# Patient Record
Sex: Female | Born: 1935 | Race: Black or African American | Hispanic: No | Smoking: Never smoker
Health system: Southern US, Community
[De-identification: ages and names within clinical notes are randomized; demographics above are authoritative.]

## PROBLEM LIST (undated history)

## (undated) DIAGNOSIS — M179 Osteoarthritis of knee, unspecified: Secondary | ICD-10-CM

## (undated) DIAGNOSIS — E785 Hyperlipidemia, unspecified: Secondary | ICD-10-CM

## (undated) DIAGNOSIS — M549 Dorsalgia, unspecified: Secondary | ICD-10-CM

## (undated) DIAGNOSIS — M25761 Osteophyte, right knee: Secondary | ICD-10-CM

## (undated) DIAGNOSIS — I10 Essential (primary) hypertension: Secondary | ICD-10-CM

## (undated) DIAGNOSIS — M1710 Unilateral primary osteoarthritis, unspecified knee: Secondary | ICD-10-CM

## (undated) HISTORY — DX: Essential (primary) hypertension: I10

## (undated) HISTORY — DX: Hyperlipidemia, unspecified: E78.5

## (undated) HISTORY — PX: OTHER SURGICAL HISTORY: SHX169

## (undated) HISTORY — DX: Osteophyte, right knee: M25.761

## (undated) HISTORY — DX: Osteoarthritis of knee, unspecified: M17.9

## (undated) HISTORY — DX: Dorsalgia, unspecified: M54.9

## (undated) HISTORY — DX: Unilateral primary osteoarthritis, unspecified knee: M17.10

---

## 2000-10-17 ENCOUNTER — Emergency Department (HOSPITAL_COMMUNITY): Admission: EM | Admit: 2000-10-17 | Discharge: 2000-10-17 | Payer: Self-pay | Admitting: *Deleted

## 2002-03-17 HISTORY — PX: OTHER SURGICAL HISTORY: SHX169

## 2002-11-01 ENCOUNTER — Encounter: Payer: Self-pay | Admitting: Family Medicine

## 2002-11-01 ENCOUNTER — Ambulatory Visit (HOSPITAL_COMMUNITY): Admission: RE | Admit: 2002-11-01 | Discharge: 2002-11-01 | Payer: Self-pay | Admitting: Family Medicine

## 2003-12-26 ENCOUNTER — Encounter (HOSPITAL_COMMUNITY): Admission: RE | Admit: 2003-12-26 | Discharge: 2003-12-26 | Payer: Self-pay | Admitting: *Deleted

## 2004-03-26 ENCOUNTER — Ambulatory Visit: Payer: Self-pay | Admitting: Family Medicine

## 2004-04-08 ENCOUNTER — Ambulatory Visit (HOSPITAL_COMMUNITY): Admission: RE | Admit: 2004-04-08 | Discharge: 2004-04-08 | Payer: Self-pay | Admitting: Family Medicine

## 2004-09-02 ENCOUNTER — Ambulatory Visit: Payer: Self-pay | Admitting: Family Medicine

## 2005-01-23 ENCOUNTER — Ambulatory Visit: Payer: Self-pay | Admitting: Family Medicine

## 2005-03-27 ENCOUNTER — Ambulatory Visit: Payer: Self-pay | Admitting: Family Medicine

## 2005-11-07 ENCOUNTER — Ambulatory Visit: Payer: Self-pay | Admitting: Family Medicine

## 2006-01-29 ENCOUNTER — Ambulatory Visit: Payer: Self-pay | Admitting: Family Medicine

## 2006-05-26 ENCOUNTER — Encounter: Payer: Self-pay | Admitting: Family Medicine

## 2006-05-26 ENCOUNTER — Other Ambulatory Visit: Admission: RE | Admit: 2006-05-26 | Discharge: 2006-05-26 | Payer: Self-pay | Admitting: Family Medicine

## 2006-05-26 ENCOUNTER — Ambulatory Visit: Payer: Self-pay | Admitting: Family Medicine

## 2006-05-26 LAB — CONVERTED CEMR LAB
ALT: 14 units/L (ref 0–35)
AST: 13 units/L (ref 0–37)
Albumin: 3.9 g/dL (ref 3.5–5.2)
Alkaline Phosphatase: 47 units/L (ref 39–117)
BUN: 21 mg/dL (ref 6–23)
Bilirubin, Direct: 0.1 mg/dL (ref 0.0–0.3)
CO2: 27 meq/L (ref 19–32)
Calcium: 9.5 mg/dL (ref 8.4–10.5)
Chloride: 103 meq/L (ref 96–112)
Cholesterol: 173 mg/dL (ref 0–200)
Creatinine, Ser: 0.6 mg/dL (ref 0.40–1.20)
Glucose, Bld: 83 mg/dL (ref 70–99)
HDL: 60 mg/dL (ref >39)
Indirect Bilirubin: 0.8 mg/dL (ref 0.0–0.9)
LDL Cholesterol: 99 mg/dL (ref 0–99)
Pap Smear: NORMAL
Potassium: 3.7 meq/L (ref 3.5–5.3)
Sodium: 141 meq/L (ref 135–145)
Total Bilirubin: 0.9 mg/dL (ref 0.3–1.2)
Total CHOL/HDL Ratio: 2.9
Total Protein: 7.2 g/dL (ref 6.0–8.3)
Triglycerides: 69 mg/dL (ref <150)
VLDL: 14 mg/dL (ref 0–40)

## 2006-11-03 ENCOUNTER — Ambulatory Visit: Payer: Self-pay | Admitting: Family Medicine

## 2006-11-08 ENCOUNTER — Inpatient Hospital Stay (HOSPITAL_COMMUNITY): Admission: EM | Admit: 2006-11-08 | Discharge: 2006-11-09 | Payer: Self-pay | Admitting: Emergency Medicine

## 2006-11-08 ENCOUNTER — Ambulatory Visit: Payer: Self-pay | Admitting: Internal Medicine

## 2006-11-11 ENCOUNTER — Ambulatory Visit: Payer: Self-pay | Admitting: Family Medicine

## 2007-03-18 ENCOUNTER — Encounter: Payer: Self-pay | Admitting: Family Medicine

## 2007-05-12 ENCOUNTER — Ambulatory Visit: Payer: Self-pay | Admitting: Family Medicine

## 2007-06-04 ENCOUNTER — Encounter: Payer: Self-pay | Admitting: Family Medicine

## 2007-06-04 LAB — CONVERTED CEMR LAB
BUN: 20 mg/dL (ref 6–23)
Basophils Absolute: 0 10*3/uL (ref 0.0–0.1)
Basophils Relative: 0 % (ref 0–1)
CO2: 27 meq/L (ref 19–32)
Calcium: 9.8 mg/dL (ref 8.4–10.5)
Chloride: 101 meq/L (ref 96–112)
Cholesterol: 179 mg/dL (ref 0–200)
Creatinine, Ser: 0.55 mg/dL (ref 0.40–1.20)
Eosinophils Absolute: 0.1 10*3/uL (ref 0.0–0.7)
Eosinophils Relative: 2 % (ref 0–5)
Glucose, Bld: 84 mg/dL (ref 70–99)
HCT: 40.1 % (ref 36.0–46.0)
HDL: 66 mg/dL (ref >39)
Hemoglobin: 14.5 g/dL (ref 12.0–15.0)
LDL Cholesterol: 101 mg/dL — ABNORMAL HIGH (ref 0–99)
Lymphocytes Relative: 31 % (ref 12–46)
Lymphs Abs: 1.7 10*3/uL (ref 0.7–4.0)
MCHC: 36.2 g/dL — ABNORMAL HIGH (ref 30.0–36.0)
MCV: 80.8 fL (ref 78.0–100.0)
Monocytes Absolute: 0.4 10*3/uL (ref 0.1–1.0)
Monocytes Relative: 8 % (ref 3–12)
Neutro Abs: 3.1 10*3/uL (ref 1.7–7.7)
Neutrophils Relative %: 59 % (ref 43–77)
Platelets: 246 10*3/uL (ref 150–400)
Potassium: 3.8 meq/L (ref 3.5–5.3)
RBC: 4.96 M/uL (ref 3.87–5.11)
RDW: 14.8 % (ref 11.5–15.5)
Sodium: 145 meq/L (ref 135–145)
Total CHOL/HDL Ratio: 2.7
Triglycerides: 58 mg/dL (ref <150)
VLDL: 12 mg/dL (ref 0–40)
WBC: 5.3 10*3/uL (ref 4.0–10.5)

## 2007-06-08 ENCOUNTER — Ambulatory Visit: Payer: Self-pay | Admitting: Family Medicine

## 2007-06-15 ENCOUNTER — Ambulatory Visit (HOSPITAL_COMMUNITY): Admission: RE | Admit: 2007-06-15 | Discharge: 2007-06-15 | Payer: Self-pay | Admitting: Family Medicine

## 2007-06-17 ENCOUNTER — Encounter: Payer: Self-pay | Admitting: Family Medicine

## 2007-06-17 DIAGNOSIS — M549 Dorsalgia, unspecified: Secondary | ICD-10-CM | POA: Insufficient documentation

## 2007-06-17 DIAGNOSIS — I10 Essential (primary) hypertension: Secondary | ICD-10-CM | POA: Insufficient documentation

## 2007-06-17 DIAGNOSIS — E785 Hyperlipidemia, unspecified: Secondary | ICD-10-CM | POA: Insufficient documentation

## 2007-06-17 DIAGNOSIS — IMO0002 Reserved for concepts with insufficient information to code with codable children: Secondary | ICD-10-CM | POA: Insufficient documentation

## 2007-06-17 DIAGNOSIS — M1710 Unilateral primary osteoarthritis, unspecified knee: Secondary | ICD-10-CM | POA: Insufficient documentation

## 2007-09-28 ENCOUNTER — Ambulatory Visit: Payer: Self-pay | Admitting: Family Medicine

## 2007-09-28 ENCOUNTER — Ambulatory Visit (HOSPITAL_COMMUNITY): Admission: RE | Admit: 2007-09-28 | Discharge: 2007-09-28 | Payer: Self-pay | Admitting: Family Medicine

## 2007-09-28 LAB — CONVERTED CEMR LAB
ALT: 16 units/L (ref 0–35)
AST: 13 units/L (ref 0–37)
Albumin: 3.9 g/dL (ref 3.5–5.2)
Alkaline Phosphatase: 43 units/L (ref 39–117)
BUN: 20 mg/dL (ref 6–23)
Basophils Absolute: 0 10*3/uL (ref 0.0–0.1)
Basophils Relative: 0 % (ref 0–1)
Bilirubin, Direct: <0.1 mg/dL (ref 0.0–0.3)
CO2: 27 meq/L (ref 19–32)
Calcium: 9.9 mg/dL (ref 8.4–10.5)
Chloride: 105 meq/L (ref 96–112)
Cholesterol: 190 mg/dL (ref 0–200)
Creatinine, Ser: 0.64 mg/dL (ref 0.40–1.20)
Eosinophils Absolute: 0.2 10*3/uL (ref 0.0–0.7)
Eosinophils Relative: 2 % (ref 0–5)
Glucose, Bld: 84 mg/dL (ref 70–99)
HCT: 38.9 % (ref 36.0–46.0)
HDL: 58 mg/dL (ref >39)
Hemoglobin: 14.2 g/dL (ref 12.0–15.0)
LDL Cholesterol: 118 mg/dL — ABNORMAL HIGH (ref 0–99)
Lymphocytes Relative: 25 % (ref 12–46)
Lymphs Abs: 2.2 10*3/uL (ref 0.7–4.0)
MCHC: 36.5 g/dL — ABNORMAL HIGH (ref 30.0–36.0)
MCV: 80.4 fL (ref 78.0–100.0)
Monocytes Absolute: 0.7 10*3/uL (ref 0.1–1.0)
Monocytes Relative: 8 % (ref 3–12)
Neutro Abs: 5.8 10*3/uL (ref 1.7–7.7)
Neutrophils Relative %: 65 % (ref 43–77)
Platelets: 267 10*3/uL (ref 150–400)
Potassium: 3.7 meq/L (ref 3.5–5.3)
RBC: 4.84 M/uL (ref 3.87–5.11)
RDW: 14.7 % (ref 11.5–15.5)
Sodium: 142 meq/L (ref 135–145)
TSH: 3.108 microintl units/mL (ref 0.350–4.50)
Total Bilirubin: 0.6 mg/dL (ref 0.3–1.2)
Total CHOL/HDL Ratio: 3.3
Total Protein: 7.6 g/dL (ref 6.0–8.3)
Triglycerides: 69 mg/dL (ref <150)
VLDL: 14 mg/dL (ref 0–40)
WBC: 8.9 10*3/uL (ref 4.0–10.5)

## 2007-10-28 ENCOUNTER — Ambulatory Visit: Payer: Self-pay | Admitting: Cardiology

## 2007-10-28 ENCOUNTER — Encounter: Payer: Self-pay | Admitting: Family Medicine

## 2008-02-14 ENCOUNTER — Ambulatory Visit: Payer: Self-pay | Admitting: Family Medicine

## 2008-05-08 ENCOUNTER — Encounter: Payer: Self-pay | Admitting: Family Medicine

## 2008-06-08 ENCOUNTER — Encounter: Payer: Self-pay | Admitting: Family Medicine

## 2008-06-19 ENCOUNTER — Ambulatory Visit (HOSPITAL_COMMUNITY): Admission: RE | Admit: 2008-06-19 | Discharge: 2008-06-19 | Payer: Self-pay | Admitting: Family Medicine

## 2008-08-16 ENCOUNTER — Ambulatory Visit: Payer: Self-pay | Admitting: Family Medicine

## 2008-08-16 ENCOUNTER — Other Ambulatory Visit: Admission: RE | Admit: 2008-08-16 | Discharge: 2008-08-16 | Payer: Self-pay | Admitting: Family Medicine

## 2008-08-16 ENCOUNTER — Encounter: Payer: Self-pay | Admitting: Family Medicine

## 2008-08-16 DIAGNOSIS — R5383 Other fatigue: Secondary | ICD-10-CM

## 2008-08-16 DIAGNOSIS — R5381 Other malaise: Secondary | ICD-10-CM | POA: Insufficient documentation

## 2008-08-16 LAB — CONVERTED CEMR LAB
ALT: 13 units/L (ref 0–35)
AST: 14 units/L (ref 0–37)
Albumin: 4 g/dL (ref 3.5–5.2)
Alkaline Phosphatase: 42 units/L (ref 39–117)
BUN: 18 mg/dL (ref 6–23)
Basophils Absolute: 0 10*3/uL (ref 0.0–0.1)
Basophils Relative: 0 % (ref 0–1)
Bilirubin, Direct: 0.1 mg/dL (ref 0.0–0.3)
CO2: 27 meq/L (ref 19–32)
Calcium: 9.9 mg/dL (ref 8.4–10.5)
Chloride: 101 meq/L (ref 96–112)
Cholesterol: 143 mg/dL (ref 0–200)
Creatinine, Ser: 0.67 mg/dL (ref 0.40–1.20)
Eosinophils Absolute: 0.1 10*3/uL (ref 0.0–0.7)
Eosinophils Relative: 1 % (ref 0–5)
Glucose, Bld: 84 mg/dL (ref 70–99)
HCT: 38 % (ref 36.0–46.0)
HDL: 63 mg/dL (ref >39)
Hemoglobin: 13.9 g/dL (ref 12.0–15.0)
Indirect Bilirubin: 0.6 mg/dL (ref 0.0–0.9)
LDL Cholesterol: 68 mg/dL (ref 0–99)
Lymphocytes Relative: 37 % (ref 12–46)
Lymphs Abs: 2.3 10*3/uL (ref 0.7–4.0)
MCHC: 36.6 g/dL — ABNORMAL HIGH (ref 30.0–36.0)
MCV: 80.9 fL (ref 78.0–100.0)
Monocytes Absolute: 0.5 10*3/uL (ref 0.1–1.0)
Monocytes Relative: 8 % (ref 3–12)
Neutro Abs: 3.3 10*3/uL (ref 1.7–7.7)
Neutrophils Relative %: 54 % (ref 43–77)
OCCULT 1: NEGATIVE
Platelets: 227 10*3/uL (ref 150–400)
Potassium: 4.1 meq/L (ref 3.5–5.3)
RBC: 4.7 M/uL (ref 3.87–5.11)
RDW: 14.8 % (ref 11.5–15.5)
Sodium: 143 meq/L (ref 135–145)
Total Bilirubin: 0.7 mg/dL (ref 0.3–1.2)
Total CHOL/HDL Ratio: 2.3
Total Protein: 7.2 g/dL (ref 6.0–8.3)
Triglycerides: 61 mg/dL (ref <150)
VLDL: 12 mg/dL (ref 0–40)
WBC: 6.2 10*3/uL (ref 4.0–10.5)

## 2008-09-19 ENCOUNTER — Telehealth: Payer: Self-pay | Admitting: Family Medicine

## 2008-11-03 ENCOUNTER — Encounter: Payer: Self-pay | Admitting: Family Medicine

## 2008-11-13 ENCOUNTER — Encounter: Payer: Self-pay | Admitting: Family Medicine

## 2008-12-01 ENCOUNTER — Encounter: Payer: Self-pay | Admitting: Family Medicine

## 2008-12-06 ENCOUNTER — Encounter: Payer: Self-pay | Admitting: Family Medicine

## 2009-01-03 ENCOUNTER — Ambulatory Visit: Payer: Self-pay | Admitting: Family Medicine

## 2009-01-06 DIAGNOSIS — E663 Overweight: Secondary | ICD-10-CM | POA: Insufficient documentation

## 2009-01-23 ENCOUNTER — Telehealth: Payer: Self-pay | Admitting: Family Medicine

## 2009-03-17 HISTORY — PX: CHOLECYSTECTOMY: SHX55

## 2009-06-19 ENCOUNTER — Ambulatory Visit: Payer: Self-pay | Admitting: Family Medicine

## 2009-06-20 LAB — CONVERTED CEMR LAB
ALT: 15 units/L (ref 0–35)
AST: 13 units/L (ref 0–37)
Albumin: 4.5 g/dL (ref 3.5–5.2)
Alkaline Phosphatase: 43 units/L (ref 39–117)
BUN: 16 mg/dL (ref 6–23)
Basophils Absolute: 0 10*3/uL (ref 0.0–0.1)
Basophils Relative: 0 % (ref 0–1)
Bilirubin, Direct: 0.1 mg/dL (ref 0.0–0.3)
CO2: 29 meq/L (ref 19–32)
Calcium: 9.8 mg/dL (ref 8.4–10.5)
Chloride: 103 meq/L (ref 96–112)
Cholesterol: 166 mg/dL (ref 0–200)
Creatinine, Ser: 0.69 mg/dL (ref 0.40–1.20)
Eosinophils Absolute: 0.1 10*3/uL (ref 0.0–0.7)
Eosinophils Relative: 2 % (ref 0–5)
Glucose, Bld: 87 mg/dL (ref 70–99)
HCT: 41.2 % (ref 36.0–46.0)
HDL: 60 mg/dL (ref >39)
Hemoglobin: 14.4 g/dL (ref 12.0–15.0)
Indirect Bilirubin: 0.6 mg/dL (ref 0.0–0.9)
LDL Cholesterol: 92 mg/dL (ref 0–99)
Lymphocytes Relative: 32 % (ref 12–46)
Lymphs Abs: 2.4 10*3/uL (ref 0.7–4.0)
MCHC: 35 g/dL (ref 30.0–36.0)
MCV: 82.1 fL (ref 78.0–100.0)
Monocytes Absolute: 0.5 10*3/uL (ref 0.1–1.0)
Monocytes Relative: 7 % (ref 3–12)
Neutro Abs: 4.5 10*3/uL (ref 1.7–7.7)
Neutrophils Relative %: 60 % (ref 43–77)
Platelets: 242 10*3/uL (ref 150–400)
Potassium: 3.8 meq/L (ref 3.5–5.3)
RBC: 5.02 M/uL (ref 3.87–5.11)
RDW: 15.1 % (ref 11.5–15.5)
Sodium: 142 meq/L (ref 135–145)
Total Bilirubin: 0.7 mg/dL (ref 0.3–1.2)
Total CHOL/HDL Ratio: 2.8
Total Protein: 7.1 g/dL (ref 6.0–8.3)
Triglycerides: 68 mg/dL (ref <150)
VLDL: 14 mg/dL (ref 0–40)
WBC: 7.5 10*3/uL (ref 4.0–10.5)

## 2009-06-25 ENCOUNTER — Ambulatory Visit (HOSPITAL_COMMUNITY): Admission: RE | Admit: 2009-06-25 | Discharge: 2009-06-25 | Payer: Self-pay | Admitting: Family Medicine

## 2009-07-20 ENCOUNTER — Ambulatory Visit: Payer: Self-pay | Admitting: Family Medicine

## 2009-10-30 ENCOUNTER — Ambulatory Visit: Payer: Self-pay | Admitting: Family Medicine

## 2009-10-30 DIAGNOSIS — K3189 Other diseases of stomach and duodenum: Secondary | ICD-10-CM | POA: Insufficient documentation

## 2009-10-30 DIAGNOSIS — R1013 Epigastric pain: Secondary | ICD-10-CM

## 2009-10-31 ENCOUNTER — Encounter: Payer: Self-pay | Admitting: Family Medicine

## 2009-11-01 ENCOUNTER — Telehealth: Payer: Self-pay | Admitting: Family Medicine

## 2009-11-01 LAB — CONVERTED CEMR LAB
ALT: 276 units/L — ABNORMAL HIGH (ref 0–35)
AST: 110 units/L — ABNORMAL HIGH (ref 0–37)
Albumin: 4.1 g/dL (ref 3.5–5.2)
Alkaline Phosphatase: 75 units/L (ref 39–117)
BUN: 16 mg/dL (ref 6–23)
Bilirubin, Direct: 0.2 mg/dL (ref 0.0–0.3)
CO2: 29 meq/L (ref 19–32)
Calcium: 9.8 mg/dL (ref 8.4–10.5)
Chloride: 100 meq/L (ref 96–112)
Cholesterol: 168 mg/dL (ref 0–200)
Creatinine, Ser: 0.63 mg/dL (ref 0.40–1.20)
Glucose, Bld: 90 mg/dL (ref 70–99)
HDL: 57 mg/dL (ref >39)
Helicobacter Pylori Antibody-IgG: 0.5
Indirect Bilirubin: 0.6 mg/dL (ref 0.0–0.9)
LDL Cholesterol: 98 mg/dL (ref 0–99)
Potassium: 3.9 meq/L (ref 3.5–5.3)
Sodium: 139 meq/L (ref 135–145)
TSH: 2.798 microintl units/mL (ref 0.350–4.500)
Total Bilirubin: 0.8 mg/dL (ref 0.3–1.2)
Total CHOL/HDL Ratio: 2.9
Total Protein: 6.9 g/dL (ref 6.0–8.3)
Triglycerides: 66 mg/dL (ref <150)
VLDL: 13 mg/dL (ref 0–40)
Vit D, 25-Hydroxy: 29 ng/mL — ABNORMAL LOW (ref 30–89)

## 2009-11-02 ENCOUNTER — Ambulatory Visit (HOSPITAL_COMMUNITY): Admission: RE | Admit: 2009-11-02 | Discharge: 2009-11-02 | Payer: Self-pay | Admitting: Family Medicine

## 2009-11-05 LAB — CONVERTED CEMR LAB
HCV Ab: NEGATIVE
Hep A IgM: NEGATIVE
Hep B C IgM: NEGATIVE
Hepatitis B Surface Ag: NEGATIVE

## 2009-11-09 ENCOUNTER — Encounter (INDEPENDENT_AMBULATORY_CARE_PROVIDER_SITE_OTHER): Payer: Self-pay | Admitting: *Deleted

## 2009-11-12 ENCOUNTER — Ambulatory Visit (HOSPITAL_COMMUNITY): Admission: RE | Admit: 2009-11-12 | Discharge: 2009-11-12 | Payer: Self-pay | Admitting: General Surgery

## 2009-11-15 ENCOUNTER — Encounter: Payer: Self-pay | Admitting: Family Medicine

## 2009-11-26 ENCOUNTER — Encounter (INDEPENDENT_AMBULATORY_CARE_PROVIDER_SITE_OTHER): Payer: Self-pay | Admitting: *Deleted

## 2010-02-19 ENCOUNTER — Ambulatory Visit: Payer: Self-pay | Admitting: Family Medicine

## 2010-02-21 LAB — CONVERTED CEMR LAB
ALT: 15 units/L (ref 0–35)
AST: 16 units/L (ref 0–37)
Albumin: 4 g/dL (ref 3.5–5.2)
Alkaline Phosphatase: 42 units/L (ref 39–117)
BUN: 17 mg/dL (ref 6–23)
Bilirubin, Direct: 0.1 mg/dL (ref 0.0–0.3)
CO2: 29 meq/L (ref 19–32)
Calcium: 9.9 mg/dL (ref 8.4–10.5)
Chloride: 103 meq/L (ref 96–112)
Cholesterol: 225 mg/dL — ABNORMAL HIGH (ref 0–200)
Creatinine, Ser: 0.65 mg/dL (ref 0.40–1.20)
Glucose, Bld: 83 mg/dL (ref 70–99)
HDL: 60 mg/dL (ref >39)
Indirect Bilirubin: 0.6 mg/dL (ref 0.0–0.9)
LDL Cholesterol: 152 mg/dL — ABNORMAL HIGH (ref 0–99)
Potassium: 3.6 meq/L (ref 3.5–5.3)
Sodium: 139 meq/L (ref 135–145)
Total Bilirubin: 0.7 mg/dL (ref 0.3–1.2)
Total CHOL/HDL Ratio: 3.8
Total Protein: 7.1 g/dL (ref 6.0–8.3)
Triglycerides: 66 mg/dL (ref <150)
VLDL: 13 mg/dL (ref 0–40)

## 2010-04-07 ENCOUNTER — Encounter: Payer: Self-pay | Admitting: Family Medicine

## 2010-04-08 ENCOUNTER — Encounter: Payer: Self-pay | Admitting: Family Medicine

## 2010-04-16 NOTE — Letter (Signed)
Summary: labs  labs   Imported By: Lind Guest 08/24/2009 13:53:47  _____________________________________________________________________  External Attachment:    Type:   Image     Comment:   External Document

## 2010-04-16 NOTE — Letter (Signed)
Summary: office notes  office notes   Imported By: Lind Guest 08/24/2009 13:54:50  _____________________________________________________________________  External Attachment:    Type:   Image     Comment:   External Document

## 2010-04-16 NOTE — Assessment & Plan Note (Signed)
Summary: F UP   Vital Signs:  Patient profile:   77 year old female Menstrual status:  postmenopausal Height:      65 inches Weight:      163 pounds BMI:     27.22 O2 Sat:      99 % Pulse rate:   60 / minute Pulse rhythm:   regular Resp:     16 per minute BP sitting:   110 / 74  (left arm) Cuff size:   large  Vitals Entered By: Everitt Amber LPN (October 30, 2009 10:12 AM)  Nutrition Counseling: Patient's BMI is greater than 25 and therefore counseled on weight management options. CC: left arm pain and she has been having stomach cramps in the mid part of abdomen for about a week. No c/o diarrhea or constipation. Was taking gas x but it seemed to make problem worse   CC:  left arm pain and she has been having stomach cramps in the mid part of abdomen for about a week. No c/o diarrhea or constipation. Was taking gas x but it seemed to make problem worse.  History of Present Illness: 1 week h/o epigastric pain with RUQ pain and belching and bloating. No change in BM.No nausea or vomitting Pt denies fever , chills , dusuria, frequency or hematuria. she c/o left arm pain with no recent trauma.  Current Medications (verified): 1)  Hydrochlorothiazide 25 Mg  Tabs (Hydrochlorothiazide) .... One Tab By Mouth Once Daily 2)  Osteo Bi-Flex Regular Strength 250-200 Mg  Tabs (Glucosamine-Chondroitin) .... One Tab By Mouth Once Daily 3)  Aspirin 81 Mg  Tbec (Aspirin) .... One Tab By Mouth Once Daily 4)  Arthritis Pain Relief 650 Mg  Tbcr (Acetaminophen) .... One Tab By Mouth Two Times A Day 5)  Tramadol Hcl 50 Mg  Tabs (Tramadol Hcl) .... One Tab By Mouth Two Times A Day As Needed 6)  Fish Oil 1000 Mg Caps (Omega-3 Fatty Acids) .... Take 3 Caps Once Daily 7)  Multivitamins  Tabs (Multiple Vitamin) .... Take 1 Tablet By Mouth Once A Day 8)  Oscal 500/200 D-3 500-200 Mg-Unit Tabs (Calcium-Vitamin D) .... Take 1 Tablet By Mouth Three Times A Day 9)  Simvastatin 40 Mg Tabs (Simvastatin) .... One  Tab By Mouth Qhs 10)  Klor-Con 20 Meq Pack (Potassium Chloride) .... Take 1 Tablet By Mouth Once A Day  Allergies (verified): 1)  ! Sulfa 2)  ! Pcn  Past History:  Past Surgical History: Removal of mole from Left neck (2004) BTL  Review of Systems      See HPI General:  Complains of fatigue. Eyes:  Denies discharge, eye pain, and red eye. ENT:  Denies earache, hoarseness, nasal congestion, and sinus pressure. CV:  Denies chest pain or discomfort, difficulty breathing while lying down, palpitations, shortness of breath with exertion, and swelling of feet. Resp:  Denies cough and sputum productive. GI:  Complains of abdominal pain, indigestion, and nausea. GU:  Denies dysuria and urinary frequency. MS:  Complains of joint pain and stiffness; chronic arthritis knees. Derm:  Complains of itching, lesion(s), and rash; stiung by hummming bird 3 weeks ago, red mark on  right forearm the following day, which lasted less than 1 day, no constitutional symptoms. Neuro:  Denies headaches and seizures. Psych:  Denies anxiety and depression. Endo:  Denies cold intolerance, excessive thirst, and excessive urination. Heme:  Denies abnormal bruising and bleeding. Allergy:  Denies hives or rash and itching eyes.  Physical Exam  General:  Well-developed,well-nourished,in no acute distress; alert,appropriate and cooperative throughout examination HEENT: No facial asymmetry,  EOMI, No sinus tenderness, TM's occluded with wax, oropharynx  pink and moist.   Chest: Clear to auscultation bilaterally.  CVS: S1, S2, No murmurs, No S3.   Abd: Soft, mild epigastric and rUQ tenderness, no guarding or rebound MS: Adequate ROM spine, hips, shoulders and reduced in  knees, which are deformed with palpable crepitus.  Ext: No edema.   CNS: CN 2-12 intact, power tone and sensation normal throughout.   Skin: Intact, no visible lesions or rashes.  Psych: Good eye contact, normal affect.  Memory intact, not  anxious or depressed appearing.    Impression & Recommendations:  Problem # 1:  CHOLELITHIASIS (ICD-574.20) Assessment Comment Only  Orders: Surgical Referral (Surgery), spoke directly with Dr Lovell Sheehan who will see her on 8/23 at 2pm, pt aware  Problem # 2:  DYSPEPSIA (ICD-536.8) Assessment: Deteriorated  Orders: TLB-H. Pylori Abs(Helicobacter Pylori) ([id number])Future Orders: Radiology Referral (Radiology) ... 11/02/2009  Problem # 3:  HYPERTENSION (ICD-401.9) Assessment: Unchanged  Her updated medication list for this problem includes:    Hydrochlorothiazide 25 Mg Tabs (Hydrochlorothiazide) ..... One tab by mouth once daily  Orders: T-Basic Metabolic Panel ([id number])  BP today: 110/74 Prior BP: 108/70 (07/20/2009)  Labs Reviewed: K+: 3.8 (06/19/2009) Creat: : 0.69 (06/19/2009)   Chol: 166 (06/19/2009)   HDL: 60 (06/19/2009)   LDL: 92 (06/19/2009)   TG: 68 (06/19/2009)  Problem # 4:  HYPERLIPIDEMIA (ICD-272.4) Assessment: Comment Only  Her updated medication list for this problem includes:    Simvastatin 40 Mg Tabs (Simvastatin) ..... One tab by mouth qhs  Orders: T-Hepatic Function [id number]) T-Lipid Profile [id number])  Labs Reviewed: SGOT: 13 (06/19/2009)   SGPT: 15 (06/19/2009)   HDL:60 (06/19/2009), 63 (08/16/2008)  LDL:92 (06/19/2009), 68 (08/16/2008)  Chol:166 (06/19/2009), 143 (08/16/2008)  Trig:68 (06/19/2009), 61 (08/16/2008)  Complete Medication List: 1)  Hydrochlorothiazide 25 Mg Tabs (Hydrochlorothiazide) .... One tab by mouth once daily 2)  Osteo Bi-flex Regular Strength 250-200 Mg Tabs (Glucosamine-chondroitin) .... One tab by mouth once daily 3)  Aspirin 81 Mg Tbec (Aspirin) .... One tab by mouth once daily 4)  Arthritis Pain Relief 650 Mg Tbcr (Acetaminophen) .... One tab by mouth two times a day 5)  Tramadol Hcl 50 Mg Tabs (Tramadol hcl) .... One tab by mouth two times a day as needed 6)  Fish Oil 1000 Mg Caps (Omega-3 fatty acids)  .... Take 3 caps once daily 7)  Multivitamins Tabs (Multiple vitamin) .... Take 1 tablet by mouth once a day 8)  Oscal 500/200 D-3 500-200 Mg-unit Tabs (Calcium-vitamin d) .... Take 1 tablet by mouth three times a day 9)  Simvastatin 40 Mg Tabs (Simvastatin) .... One tab by mouth qhs 10)  Klor-con 20 Meq Pack (Potassium chloride) .... Take 1 tablet by mouth once a day 11)  Omeprazole 20 Mg Cpdr (Omeprazole) .... Take 1 capsule by mouth once a day  Other Orders: T-TSH [id number]) T-Vitamin D (25-Hydroxy) [id number]) Gastroenterology Referral (GI)  Patient Instructions: 1)  Please schedule a follow-up appointment in 3.5 months. 2)  BMP prior to visit, ICD-9: 3)  Hepatic Panel prior to visit, ICD-9: 4)  Lipid Panel prior to visit, ICD-9:   today fasting today 5)  TSH prior to visit, ICD-9: 6)  Vit D andHpylori 7)  You will be referred for  testing of your gall baldder to check on the abdominal pain,  a med is sent in for dyspepsia  Prescriptions: OMEPRAZOLE 20 MG CPDR (OMEPRAZOLE) Take 1 capsule by mouth once a day  #30 x 1   Entered and Authorized by:   Syliva Overman MD   Signed by:   Syliva Overman MD on 10/30/2009   Method used:   Electronically to        [facility] (retail)       [address]       [place], [place]  [address]       Ph: [phone]       Fax: [phone]   RxID:   [id number]

## 2010-04-16 NOTE — Letter (Signed)
Summary: consults  consults   Imported By: Lind Guest 08/24/2009 13:52:17  _____________________________________________________________________  External Attachment:    Type:   Image     Comment:   External Document

## 2010-04-16 NOTE — Letter (Signed)
Summary: phone notes  phone notes   Imported By: Lind Guest 08/24/2009 13:55:22  _____________________________________________________________________  External Attachment:    Type:   Image     Comment:   External Document

## 2010-04-16 NOTE — Letter (Signed)
Summary: history and physical  history and physical   Imported By: Lind Guest 08/24/2009 13:53:13  _____________________________________________________________________  External Attachment:    Type:   Image     Comment:   External Document

## 2010-04-16 NOTE — Assessment & Plan Note (Signed)
Summary: F UP EARS   Vital Signs:  Patient profile:   77 year old female Menstrual status:  postmenopausal Height:      65 inches Weight:      168.50 pounds BMI:     28.14 O2 Sat:      98 % Pulse rate:   80 / minute Resp:     16 per minute BP sitting:   108 / 70  (left arm) Cuff size:   large  Vitals Entered By: Everitt Amber LPN (Jul 20, 1608 9:13 AM) CC: Follow up visit and bilateral ear irrigation   CC:  Follow up visit and bilateral ear irrigation.  Current Medications (verified): 1)  Hydrochlorothiazide 25 Mg  Tabs (Hydrochlorothiazide) .... One Tab By Mouth Once Daily 2)  Osteo Bi-Flex Regular Strength 250-200 Mg  Tabs (Glucosamine-Chondroitin) .... One Tab By Mouth Once Daily 3)  Aspirin 81 Mg  Tbec (Aspirin) .... One Tab By Mouth Once Daily 4)  Arthritis Pain Relief 650 Mg  Tbcr (Acetaminophen) .... One Tab By Mouth Two Times A Day 5)  Tramadol Hcl 50 Mg  Tabs (Tramadol Hcl) .... One Tab By Mouth Two Times A Day As Needed 6)  Fish Oil 1000 Mg Caps (Omega-3 Fatty Acids) .... Take 3 Caps Once Daily 7)  Multivitamins  Tabs (Multiple Vitamin) .... Take 1 Tablet By Mouth Once A Day 8)  Oscal 500/200 D-3 500-200 Mg-Unit Tabs (Calcium-Vitamin D) .... Take 1 Tablet By Mouth Three Times A Day 9)  Simvastatin 40 Mg Tabs (Simvastatin) .... One Tab By Mouth Qhs 10)  Klor-Con 20 Meq Pack (Potassium Chloride) .... Take 1 Tablet By Mouth Once A Day  Allergies (verified): 1)  ! Sulfa 2)  ! Pcn   Complete Medication List: 1)  Hydrochlorothiazide 25 Mg Tabs (Hydrochlorothiazide) .... One tab by mouth once daily 2)  Osteo Bi-flex Regular Strength 250-200 Mg Tabs (Glucosamine-chondroitin) .... One tab by mouth once daily 3)  Aspirin 81 Mg Tbec (Aspirin) .... One tab by mouth once daily 4)  Arthritis Pain Relief 650 Mg Tbcr (Acetaminophen) .... One tab by mouth two times a day 5)  Tramadol Hcl 50 Mg Tabs (Tramadol hcl) .... One tab by mouth two times a day as needed 6)  Fish Oil 1000 Mg  Caps (Omega-3 fatty acids) .... Take 3 caps once daily 7)  Multivitamins Tabs (Multiple vitamin) .... Take 1 tablet by mouth once a day 8)  Oscal 500/200 D-3 500-200 Mg-unit Tabs (Calcium-vitamin d) .... Take 1 tablet by mouth three times a day 9)  Simvastatin 40 Mg Tabs (Simvastatin) .... One tab by mouth qhs 10)  Klor-con 20 Meq Pack (Potassium chloride) .... Take 1 tablet by mouth once a day  Other Orders: Cerumen Impaction Removal ([id number]) successful ear irrigation, notrauma noted, Tm's clear B

## 2010-04-16 NOTE — Letter (Signed)
Summary: x rays  x rays   Imported By: Lind Guest 08/24/2009 13:55:55  _____________________________________________________________________  External Attachment:    Type:   Image     Comment:   External Document

## 2010-04-16 NOTE — Assessment & Plan Note (Signed)
Summary: office visit   Vital Signs:  Patient profile:   77 year old female Menstrual status:  postmenopausal Height:      65 inches Weight:      169 pounds BMI:     28.22 O2 Sat:      95 % Pulse rate:   69 / minute Pulse rhythm:   regular Resp:     16 per minute BP sitting:   112 / 72  (left arm) Cuff size:   large  Vitals Entered By: Everitt Amber LPN (June 20, 8655 10:40 AM)  Nutrition Counseling: Patient's BMI is greater than 25 and therefore counseled on weight management options. CC: having some arthritis pain in her knees   CC:  having some arthritis pain in her knees.  History of Present Illness: Reports  thatshe ahs been doing fairly well, except for increased knee pain and stifness withj instability. shewants to be re-evaluated by ortho for thios. She denies falling. Denies recent fever or chills. Denies sinus pressure, nasal congestion , ear pain or sore throat. Denies chest congestion, or cough productive of sputum. Denies chest pain, palpitations, PND, orthopnea or leg swelling. Denies abdominal pain, nausea, vomitting, diarrhea or constipation. Denies change in bowel movements or bloody stool. Denies dysuria , frequency, incontinence or hesitancy.  Denies headaches, vertigo, seizures. Denies depression, anxiety or insomnia. Denies  rash, lesions, or itch.     Current Medications (verified): 1)  Hydrochlorothiazide 25 Mg  Tabs (Hydrochlorothiazide) .... One Tab By Mouth Once Daily 2)  Osteo Bi-Flex Regular Strength 250-200 Mg  Tabs (Glucosamine-Chondroitin) .... One Tab By Mouth Once Daily 3)  Aspirin 81 Mg  Tbec (Aspirin) .... One Tab By Mouth Once Daily 4)  Arthritis Pain Relief 650 Mg  Tbcr (Acetaminophen) .... One Tab By Mouth Two Times A Day 5)  Tramadol Hcl 50 Mg  Tabs (Tramadol Hcl) .... One Tab By Mouth Two Times A Day As Needed 6)  Fish Oil 1000 Mg Caps (Omega-3 Fatty Acids) .... Take 3 Caps Once Daily 7)  Multivitamins  Tabs (Multiple Vitamin)  .... Take 1 Tablet By Mouth Once A Day 8)  Oscal 500/200 D-3 500-200 Mg-Unit Tabs (Calcium-Vitamin D) .... Take 1 Tablet By Mouth Three Times A Day 9)  Simvastatin 40 Mg Tabs (Simvastatin) .... One Tab By Mouth Qhs 10)  Klor-Con 20 Meq Pack (Potassium Chloride) .... Take 1 Tablet By Mouth Once A Day  Allergies (verified): 1)  ! Sulfa 2)  ! Pcn  Review of Systems      See HPI Eyes:  Denies blurring and discharge. ENT:  Complains of decreased hearing. MS:  Complains of joint pain and stiffness; c/o increased and uncontrolled bilateral knee pain right worse than left with instability, she has not fallen, wants a 2nd iopinion. Heme:  Denies abnormal bruising and bleeding. Allergy:  Complains of seasonal allergies.  Physical Exam  General:  Well-developed,well-nourished,in no acute distress; alert,appropriate and cooperative throughout examination HEENT: No facial asymmetry,  EOMI, No sinus tenderness, TM's occluded with wax, oropharynx  pink and moist.   Chest: Clear to auscultation bilaterally.  CVS: S1, S2, No murmurs, No S3.   Abd: Soft, Nontender.  MS: Adequate ROM spine, hips, shoulders and reduced in  knees, which are deformed with palpable crepitus.  Ext: No edema.   CNS: CN 2-12 intact, power tone and sensation normal throughout.   Skin: Intact, no visible lesions or rashes.  Psych: Good eye contact, normal affect.  Memory intact, not  anxious or depressed appearing.    Impression & Recommendations:  Problem # 1:  HYPERTENSION (ICD-401.9) Assessment Unchanged  Her updated medication list for this problem includes:    Hydrochlorothiazide 25 Mg Tabs (Hydrochlorothiazide) ..... One tab by mouth once daily  Orders: T-Basic Metabolic Panel [id number])  BP today: 112/72 Prior BP: 116/78 (01/03/2009)  Labs Reviewed: K+: 4.1 (08/16/2008) Creat: : 0.67 (08/16/2008)   Chol: 143 (08/16/2008)   HDL: 63 (08/16/2008)   LDL: 68 (08/16/2008)   TG: 61 (08/16/2008)  Problem # 2:   HYPERLIPIDEMIA (ICD-272.4) Assessment: Comment Only  Her updated medication list for this problem includes:    Simvastatin 40 Mg Tabs (Simvastatin) ..... One tab by mouth qhs  Orders: T-Hepatic Function [id number]) T-Lipid Profile [id number])  Labs Reviewed: SGOT: 14 (08/16/2008)   SGPT: 13 (08/16/2008)   HDL:63 (08/16/2008), 58 (09/28/2007)  LDL:68 (08/16/2008), 118 (41/32/4401)  Chol:143 (08/16/2008), 190 (09/28/2007)  Trig:61 (08/16/2008), 69 (09/28/2007)  Problem # 3:  OSTEOARTHRITIS, KNEE (ICD-715.96) Assessment: Deteriorated  Her updated medication list for this problem includes:    Aspirin 81 Mg Tbec (Aspirin) ..... One tab by mouth once daily    Arthritis Pain Relief 650 Mg Tbcr (Acetaminophen) ..... One tab by mouth two times a day    Tramadol Hcl 50 Mg Tabs (Tramadol hcl) ..... One tab by mouth two times a day as needed  Orders: Depo- Medrol 80mg  (J1040) Ketorolac-Toradol 15mg  (U2725) Admin of Therapeutic Inj  intramuscular or subcutaneous (36644) Orthopedic Referral (Ortho)  Problem # 4:  CERUMEN IMPACTION, BILATERAL (ICD-380.4) Assessment: Comment Only prt to return fo ear irrigation after using softener  Complete Medication List: 1)  Hydrochlorothiazide 25 Mg Tabs (Hydrochlorothiazide) .... One tab by mouth once daily 2)  Osteo Bi-flex Regular Strength 250-200 Mg Tabs (Glucosamine-chondroitin) .... One tab by mouth once daily 3)  Aspirin 81 Mg Tbec (Aspirin) .... One tab by mouth once daily 4)  Arthritis Pain Relief 650 Mg Tbcr (Acetaminophen) .... One tab by mouth two times a day 5)  Tramadol Hcl 50 Mg Tabs (Tramadol hcl) .... One tab by mouth two times a day as needed 6)  Fish Oil 1000 Mg Caps (Omega-3 fatty acids) .... Take 3 caps once daily 7)  Multivitamins Tabs (Multiple vitamin) .... Take 1 tablet by mouth once a day 8)  Oscal 500/200 D-3 500-200 Mg-unit Tabs (Calcium-vitamin d) .... Take 1 tablet by mouth three times a day 9)  Simvastatin 40 Mg Tabs  (Simvastatin) .... One tab by mouth qhs 10)  Klor-con 20 Meq Pack (Potassium chloride) .... Take 1 tablet by mouth once a day 11)  Prednisone (pak) 5 Mg Tabs (Prednisone) .... Use as directed  Other Orders: T-CBC w/Diff ([id number]) Radiology Referral (Radiology)  Patient Instructions: 1)  Please schedule a follow-up appointment in 1 months.mD visit  and ear irrigation 2)  Your blood pressure is great. 3)  I will refer you for a mamogram as wellas to Dr Lessie Dings about your knees 4)  BMP prior to visit, ICD-9: 5)  Hepatic Panel prior to visit, ICD-9: 6)  Lipid Panel prior to visit, ICD-9:  fasting today 7)  CBC w/ Diff prior to visit, ICD-9: 8)  You will et 2 injections and a short course of prednisone Prescriptions: PREDNISONE (PAK) 5 MG TABS (PREDNISONE) Use as directed  #21 x 0   Entered and Authorized by:   Syliva Overman MD   Signed by:   Syliva Overman MD on 06/19/2009   Method used:   Electronically  to        [facility] (retail)       [address]       [place], [place]  [address]       Ph: [phone]       Fax: [phone]   RxID:   [id number]    Medication Administration  Injection # 1:    Medication: Depo- Medrol 80mg     Diagnosis: OSTEOARTHRITIS, KNEE (ICD-715.96)    Route: IM    Site: RUOQ gluteus    Exp Date: 01/2010    Lot #: [id number]    Mfr: Pharmacia    Comments: 80mg  given     Patient tolerated injection without complications    Given by: Everitt Amber LPN (June 19, 6385 11:41 AM)  Injection # 2:    Medication: Ketorolac-Toradol 15mg     Diagnosis: OSTEOARTHRITIS, KNEE (ICD-715.96)    Route: IM    Site: LUOQ gluteus    Exp Date: 10/2010    Lot #: [id number]    Mfr: novaplus    Comments: 60mg  given     Patient tolerated injection without complications    Given by: Everitt Amber LPN (June 19, 5641 11:42 AM)  Orders Added: 1)  Est. Patient Level IV [32951] 2)  T-Basic Metabolic Panel [[id number]] 3)   T-Hepatic Function [[id number]] 4)  T-Lipid Profile [[id number]] 5)  T-CBC w/Diff [[id number]] 6)  Depo- Medrol 80mg  [J1040] 7)  Ketorolac-Toradol 15mg  [J1885] 8)  Admin of Therapeutic Inj  intramuscular or subcutaneous [96372] 9)  Radiology Referral [Radiology] 10)  Orthopedic Referral [Ortho]

## 2010-04-16 NOTE — Letter (Signed)
Summary: misc  misc   Imported By: Lind Guest 08/24/2009 13:54:19  _____________________________________________________________________  External Attachment:    Type:   Image     Comment:   External Document

## 2010-04-16 NOTE — Letter (Signed)
Summary: Generic Letter, Intro to Referring  [facility]  [address]   [place], [place] [address]   Phone: [phone]  Fax: [phone]      November 26, 2009             RE: Lori Stein   Aug 31, 1932                 [address]                 [place], [place]  [address]  Dear Kemper Durie,  Patient cancelled today's appointment and didn't reschedule at this time.            Sincerely,    Diana Eves  [facility] Ph: [phone]   Fax: [phone]

## 2010-04-16 NOTE — Letter (Signed)
Summary: demo  demo   Imported By: Lind Guest 08/24/2009 13:52:41  _____________________________________________________________________  External Attachment:    Type:   Image     Comment:   External Document

## 2010-04-16 NOTE — Letter (Signed)
Summary: Unable to Reach, Consult Scheduled  [facility]  [address]   [place], [place] [address]   Phone: [phone]  Fax: [phone]    11/09/2009  Lori Stein [address] [place], [place]  [address] 05-11-1932   Dear Ms. Reinwald,      At the recommendation of DR SIMPSON  we have been asked to schedule you a consult with DR ROURK/DR FIELDS for ABNORMAL LIVER FUNCTION.  Your appointment is scheduled for 11-26-09 @ 1030AM . If you are unable to keep this appointment, or if you have any questions,  please call our office at [phone].     Thank you,    Diana Eves  [facility] R. Roetta Sessions, M.D.    Jonette Eva, M.D. Lorenza Burton, FNP-BC    Tana Coast, PA-C Phone: [phone]    Fax: [phone]

## 2010-04-16 NOTE — Progress Notes (Signed)
Summary: results of lab  Phone Note Call from Patient   Summary of Call: pt is calling about lab results. [phone] Initial call taken by: Rudene Anda,  November 01, 2009 9:35 AM  Follow-up for Phone Call        told patient not to take the simvastatin anymore and that you had added some tests on to the labs that were already done and we would call her with those results but in the meantime to stop the simvastatin Follow-up by: Everitt Amber LPN,  November 01, 2009 9:45 AM

## 2010-04-16 NOTE — Letter (Signed)
Summary: ADD ON LAB  ADD ON LAB   Imported By: Lind Guest 11/15/2009 14:00:13  _____________________________________________________________________  External Attachment:    Type:   Image     Comment:   External Document

## 2010-04-18 NOTE — Assessment & Plan Note (Signed)
Summary: f up   Vital Signs:  Patient profile:   77 year old female Menstrual status:  postmenopausal Height:      65 inches Weight:      164.75 pounds BMI:     27.51 O2 Sat:      95 % on Room air Pulse rate:   62 / minute Pulse rhythm:   regular BP sitting:   118 / 70  (left arm)  Vitals Entered By: Adella Hare LPN (February 19, 2010 11:23 AM)  Nutrition Counseling: Patient's BMI is greater than 25 and therefore counseled on weight management options.  O2 Flow:  Room air CC: follow-up visit Is Patient Diabetic? No Pain Assessment Patient in pain? no        CC:  follow-up visit.  History of Present Illness: Reports  that she ha s been doing fairly well, except for increased knee pain adnstiffness. Denies recent fever or chills. Denies sinus pressure, nasal congestion , ear pain or sore throat. Denies chest congestion, or cough productive of sputum. Denies chest pain, palpitations, PND, orthopnea or leg swelling. Denies abdominal pain, nausea, vomitting, diarrhea or constipation.Symptoms are improvedsince recent cholecystectomy. Denies change in bowel movements or bloody stool. Denies dysuria , frequency, incontinence or hesitancy.  Denies headaches, vertigo, seizures. Denies depression, anxiety or insomnia. Denies  rash, lesions, or itch.  '   Current Medications (verified): 1)  Hydrochlorothiazide 25 Mg  Tabs (Hydrochlorothiazide) .... One Tab By Mouth Once Daily 2)  Osteo Bi-Flex Regular Strength 250-200 Mg  Tabs (Glucosamine-Chondroitin) .... One Tab By Mouth Once Daily 3)  Aspirin 81 Mg  Tbec (Aspirin) .... One Tab By Mouth Once Daily 4)  Arthritis Pain Relief 650 Mg  Tbcr (Acetaminophen) .... One Tab By Mouth Two Times A Day 5)  Fish Oil 1000 Mg Caps (Omega-3 Fatty Acids) .... Take 3 Caps Once Daily 6)  Multivitamins  Tabs (Multiple Vitamin) .... Take 1 Tablet By Mouth Once A Day 7)  Oscal 500/200 D-3 500-200 Mg-Unit Tabs (Calcium-Vitamin D) .... Take 1  Tablet By Mouth Three Times A Day 8)  Klor-Con 20 Meq Pack (Potassium Chloride) .... Take 1 Tablet By Mouth Once A Day  Allergies (verified): 1)  ! Sulfa 2)  ! Pcn  Past History:  Past medical, surgical, family and social histories (including risk factors) reviewed, and no changes noted (except as noted below).  Past Medical History: Reviewed history from 06/17/2007 and no changes required. Current Problems:  BACK PAIN (ICD-724.5) OSTEOARTHRITIS, KNEE (ICD-715.96) HYPERLIPIDEMIA (ICD-272.4) HYPERTENSION (ICD-401.9)  Past Surgical History: Removal of mole from Left neck (2004) BTL cholecystectomy in 2011  Family History: Reviewed history from 06/17/2007 and no changes required. Mom deceased AAA Sister 6 living ,HTN,DM Dad deceased HTN,MI Brothers 1 DM  Social History: Reviewed history from 06/17/2007 and no changes required. employed Married 3 children Never Smoked Alcohol use-no Drug use-no  Review of Systems      See HPI Eyes:  Denies discharge, eye pain, and red eye. MS:  Complains of joint pain and stiffness; bilateral knees, right greater than left. Endo:  Denies cold intolerance, excessive hunger, excessive thirst, excessive urination, and polyuria. Heme:  Denies abnormal bruising and bleeding. Allergy:  Denies hives or rash and itching eyes.  Physical Exam  General:  Well-developed,well-nourished,in no acute distress; alert,appropriate and cooperative throughout examination HEENT: No facial asymmetry,  EOMI, No sinus tenderness, TM's Clear, oropharynx  pink and moist.   Chest: Clear to auscultation bilaterally.  CVS: S1, S2,  No murmurs, No S3.   Abd: Soft, Nontender.  MS: Adequate ROM spine, hips, shoulders and  reduced in knees, with crepitus  Ext: No edema.   CNS: CN 2-12 intact, power tone and sensation normal throughout.   Skin: Intact, no visible lesions or rashes.  Psych: Good eye contact, normal affect.  Memory intact, not anxious or depressed  appearing.    Impression & Recommendations:  Problem # 1:  OSTEOARTHRITIS, KNEE (ICD-715.96) Assessment Deteriorated  The following medications were removed from the medication list:    Tramadol Hcl 50 Mg Tabs (Tramadol hcl) ..... One tab by mouth two times a day as needed Her updated medication list for this problem includes:    Aspirin 81 Mg Tbec (Aspirin) ..... One tab by mouth once daily    Arthritis Pain Relief 650 Mg Tbcr (Acetaminophen) ..... One tab by mouth two times a day  Orders: Medicare Electronic Prescription [phone]) Depo- Medrol 80mg  (J1040) Ketorolac-Toradol 15mg  (U9811) Admin of Therapeutic Inj  intramuscular or subcutaneous (91478)  Problem # 2:  HYPERTENSION (ICD-401.9) Assessment: Unchanged  Her updated medication list for this problem includes:    Hydrochlorothiazide 25 Mg Tabs (Hydrochlorothiazide) ..... One tab by mouth once daily  Orders: T-Basic Metabolic Panel ([id number])  BP today: 118/70 Prior BP: 110/74 (10/30/2009)  Labs Reviewed: K+: 3.9 (10/30/2009) Creat: : 0.63 (10/30/2009)   Chol: 168 (10/30/2009)   HDL: 57 (10/30/2009)   LDL: 98 (10/30/2009)   TG: 66 (10/30/2009)  Problem # 3:  HYPERLIPIDEMIA (ICD-272.4) Assessment: Comment Only  The following medications were removed from the medication list:    Simvastatin 40 Mg Tabs (Simvastatin) ..... One tab by mouth qhs Her updated medication list for this problem includes:    Simvastatin 10 Mg Tabs (Simvastatin) .Marland Kitchen... Take 1 tab by mouth at bedtime  Orders: T-Hepatic Function [phone]) T-Lipid Profile [phone]) Low fat dietdiscussed and encouraged  Labs Reviewed: SGOT: 110 (10/30/2009)   SGPT: 276 (10/30/2009)   HDL:57 (10/30/2009), 60 (06/19/2009)  LDL:98 (10/30/2009), 92 (06/19/2009)  Chol:168 (10/30/2009), 166 (06/19/2009)  Trig:66 (10/30/2009), 68 (06/19/2009)  Complete Medication List: 1)  Hydrochlorothiazide 25 Mg Tabs (Hydrochlorothiazide) .... One tab by mouth once  daily 2)  Osteo Bi-flex Regular Strength 250-200 Mg Tabs (Glucosamine-chondroitin) .... One tab by mouth once daily 3)  Aspirin 81 Mg Tbec (Aspirin) .... One tab by mouth once daily 4)  Arthritis Pain Relief 650 Mg Tbcr (Acetaminophen) .... One tab by mouth two times a day 5)  Fish Oil 1000 Mg Caps (Omega-3 fatty acids) .... Take 3 caps once daily 6)  Multivitamins Tabs (Multiple vitamin) .... Take 1 tablet by mouth once a day 7)  Oscal 500/200 D-3 500-200 Mg-unit Tabs (Calcium-vitamin d) .... Take 1 tablet by mouth three times a day 8)  Klor-con 20 Meq Pack (Potassium chloride) .... Take 1 tablet by mouth once a day 9)  Simvastatin 10 Mg Tabs (Simvastatin) .... Take 1 tab by mouth at bedtime  Other Orders: Influenza Vaccine NON MCR (02725)  Patient Instructions: 1)  Please schedule a follow-up appointment in 4 months. 2)  meds sent in and shots today for arthritis 3)  You will get vimovo one twice daily for 6 days 4)  BMP prior to visit, ICD-9: 5)  Hepatic Panel prior to visit, ICD-9:  today fasting Prescriptions: SIMVASTATIN 10 MG TABS (SIMVASTATIN) Take 1 tab by mouth at bedtime  #30 x 4   Entered and Authorized by:   Syliva Overman MD   Signed by:  Syliva Overman MD on 02/21/2010   Method used:   Historical   RxID:   [id number] PREDNISONE (PAK) 5 MG TABS (PREDNISONE) Use as directed  #21 x 0   Entered and Authorized by:   Syliva Overman MD   Signed by:   Syliva Overman MD on 02/19/2010   Method used:   Electronically to        [facility] (retail)       [address]       [place], [place]  [address]       Ph: [phone]       Fax: [phone]   RxID:   [id number]    Medication Administration  Injection # 1:    Medication: Depo- Medrol 80mg     Diagnosis: OSTEOARTHRITIS, KNEE (ICD-715.96)    Route: IM    Site: RUOQ gluteus    Exp Date: 06/12    Lot #: [id number]    Mfr: Pharmacia    Patient tolerated injection  without complications    Given by: Adella Hare LPN (February 19, 2010 12:49 PM)  Injection # 2:    Medication: Ketorolac-Toradol 15mg     Diagnosis: OSTEOARTHRITIS, KNEE (ICD-715.96)    Route: IM    Site: LUOQ gluteus    Exp Date: 01/16/2011    Lot #: [id number]    Mfr: [email or web address]    Comments: toradol 60mg  given    Patient tolerated injection without complications    Given by: Adella Hare LPN (February 19, 2010 12:50 PM)  Orders Added: 1)  Est. Patient Level IV [35573] 2)  Medicare Electronic Prescription [G8553] 3)  T-Basic Metabolic Panel [80048-22910] 4)  T-Hepatic Function [80076-22960] 5)  T-Lipid Profile [80061-22930] 6)  Depo- Medrol 80mg  [J1040] 7)  Ketorolac-Toradol 15mg  [J1885] 8)  Admin of Therapeutic Inj  intramuscular or subcutaneous [96372] 9)  Influenza Vaccine NON MCR [00028]   Immunizations Administered:  Influenza Vaccine # 1:    Vaccine Type: Fluvax Non-MCR    Site: left deltoid    Mfr: GlaxoSmithKline    Dose: 0.5 ml    Route: IM    Given by: Adella Hare LPN    Exp. Date: 09/14/2010    Lot #: [id number]    VIS given: 10/09/09 version given February 19, 2010.   Immunizations Administered:  Influenza Vaccine # 1:    Vaccine Type: Fluvax Non-MCR    Site: left deltoid    Mfr: GlaxoSmithKline    Dose: 0.5 ml    Route: IM    Given by: Adella Hare LPN    Exp. Date: 09/14/2010    Lot #: [id number]    VIS given: 10/09/09 version given February 19, 2010.  vimovo samples given [id number] 05/25/11  Medication Administration  Injection # 1:    Medication: Depo- Medrol 80mg     Diagnosis: OSTEOARTHRITIS, KNEE (ICD-715.96)    Route: IM    Site: RUOQ gluteus    Exp Date: 06/12    Lot #: [id number]    Mfr: Pharmacia    Patient tolerated injection without complications    Given by: Adella Hare LPN (February 19, 2010 12:49 PM)  Injection # 2:    Medication: Ketorolac-Toradol 15mg     Diagnosis: OSTEOARTHRITIS, KNEE (ICD-715.96)    Route: IM    Site: LUOQ  gluteus    Exp Date: 01/16/2011    Lot #: [id number]    Mfr: novaplus    Comments: toradol 60mg   given    Patient tolerated injection without complications    Given by: Adella Hare LPN (February 19, 2010 12:50 PM)  Orders Added: 1)  Est. Patient Level IV [99214] 2)  Medicare Electronic Prescription [G8553] 3)  T-Basic Metabolic Panel [80048-22910] 4)  T-Hepatic Function [80076-22960] 5)  T-Lipid Profile [80061-22930] 6)  Depo- Medrol 80mg  [J1040] 7)  Ketorolac-Toradol 15mg  [J1885] 8)  Admin of Therapeutic Inj  intramuscular or subcutaneous [96372] 9)  Influenza Vaccine NON MCR [00028]

## 2010-05-29 ENCOUNTER — Encounter: Payer: Self-pay | Admitting: Family Medicine

## 2010-05-31 LAB — CBC
HCT: 39.2 % (ref 36.0–46.0)
Hemoglobin: 13.2 g/dL (ref 12.0–15.0)
MCH: 30 pg (ref 26.0–34.0)
MCHC: 33.7 g/dL (ref 30.0–36.0)
MCV: 89.1 fL (ref 78.0–100.0)
Platelets: 231 K/uL (ref 150–400)
RBC: 4.4 MIL/uL (ref 3.87–5.11)
RDW: 14.3 % (ref 11.5–15.5)
WBC: 5.7 K/uL (ref 4.0–10.5)

## 2010-05-31 LAB — COMPREHENSIVE METABOLIC PANEL WITH GFR
ALT: 25 U/L (ref 0–35)
AST: 18 U/L (ref 0–37)
Albumin: 3.5 g/dL (ref 3.5–5.2)
Alkaline Phosphatase: 46 U/L (ref 39–117)
BUN: 18 mg/dL (ref 6–23)
CO2: 33 meq/L — ABNORMAL HIGH (ref 19–32)
Calcium: 9.6 mg/dL (ref 8.4–10.5)
Chloride: 102 meq/L (ref 96–112)
Creatinine, Ser: 0.67 mg/dL (ref 0.4–1.2)
GFR calc Af Amer: >60 mL/min (ref >60)
GFR calc non Af Amer: >60 mL/min (ref >60)
Glucose, Bld: 84 mg/dL (ref 70–99)
Potassium: 3.5 meq/L (ref 3.5–5.1)
Sodium: 139 meq/L (ref 135–145)
Total Bilirubin: 0.8 mg/dL (ref 0.3–1.2)
Total Protein: 6.4 g/dL (ref 6.0–8.3)

## 2010-05-31 LAB — SURGICAL PCR SCREEN
MRSA, PCR: NEGATIVE
Staphylococcus aureus: NEGATIVE

## 2010-06-04 NOTE — Letter (Signed)
Summary: reschedule appoinment  reschedule appoinment   Imported By: Lind Guest 05/29/2010 16:09:45  _____________________________________________________________________  External Attachment:    Type:   Image     Comment:   External Document

## 2010-06-21 ENCOUNTER — Ambulatory Visit: Payer: Self-pay | Admitting: Family Medicine

## 2010-07-08 ENCOUNTER — Encounter: Payer: Self-pay | Admitting: Family Medicine

## 2010-07-09 ENCOUNTER — Encounter: Payer: Self-pay | Admitting: Family Medicine

## 2010-07-11 ENCOUNTER — Encounter: Payer: Self-pay | Admitting: Family Medicine

## 2010-07-11 ENCOUNTER — Ambulatory Visit: Payer: Self-pay | Admitting: Family Medicine

## 2010-07-11 ENCOUNTER — Ambulatory Visit (HOSPITAL_COMMUNITY)
Admission: RE | Admit: 2010-07-11 | Discharge: 2010-07-11 | Disposition: A | Discharge status: Home / Self Care | Payer: Medicare Other | Source: Ambulatory Visit | Attending: Family Medicine | Admitting: Family Medicine

## 2010-07-11 ENCOUNTER — Ambulatory Visit (INDEPENDENT_AMBULATORY_CARE_PROVIDER_SITE_OTHER): Payer: Medicare Other | Admitting: Family Medicine

## 2010-07-11 VITALS — BP 140/82 | HR 59 | Resp 16 | Ht 65.75 in | Wt 167.1 lb

## 2010-07-11 DIAGNOSIS — I10 Essential (primary) hypertension: Secondary | ICD-10-CM

## 2010-07-11 DIAGNOSIS — E785 Hyperlipidemia, unspecified: Secondary | ICD-10-CM

## 2010-07-11 DIAGNOSIS — N3090 Cystitis, unspecified without hematuria: Secondary | ICD-10-CM

## 2010-07-11 DIAGNOSIS — Z01818 Encounter for other preprocedural examination: Secondary | ICD-10-CM | POA: Insufficient documentation

## 2010-07-11 DIAGNOSIS — M1710 Unilateral primary osteoarthritis, unspecified knee: Secondary | ICD-10-CM

## 2010-07-11 DIAGNOSIS — IMO0002 Reserved for concepts with insufficient information to code with codable children: Secondary | ICD-10-CM

## 2010-07-11 LAB — LIPID PANEL
Cholesterol: 196 mg/dL (ref 0–200)
HDL: 65 mg/dL (ref >39)
Total CHOL/HDL Ratio: 3 Ratio

## 2010-07-11 LAB — HEPATIC FUNCTION PANEL
Albumin: 4.4 g/dL (ref 3.5–5.2)
Alkaline Phosphatase: 44 U/L (ref 39–117)
Bilirubin, Direct: 0.1 mg/dL (ref 0.0–0.3)
Total Bilirubin: 0.8 mg/dL (ref 0.3–1.2)

## 2010-07-11 LAB — POCT URINALYSIS DIPSTICK
Spec Grav, UA: 1.025
pH, UA: 6

## 2010-07-11 LAB — BASIC METABOLIC PANEL
Calcium: 10.5 mg/dL (ref 8.4–10.5)
Glucose, Bld: 79 mg/dL (ref 70–99)
Sodium: 144 mEq/L (ref 135–145)

## 2010-07-11 MED ORDER — CIPROFLOXACIN HCL 500 MG PO TABS: 500.0000 mg | ORAL_TABLET | Freq: Two times a day (BID) | ORAL | Status: AC

## 2010-07-11 NOTE — Patient Instructions (Addendum)
F/u in 5 months.  All the best with your surgery. We will call as soon as all your labs are in and the Xray report, and also notify your doctor. I believe you will do very well, and you certainly need it.  Labs today  CXR today

## 2010-07-11 NOTE — Progress Notes (Signed)
  Subjective:    Patient ID: Lori Stein, female    DOB: 10-Jan-1933, 77 y.o.   MRN: [id number]  HPI Pt in for clearance for left knee surgery asap. She reports uncontrolled pain and instability of the right knee, saw her ortho yesterday who refused further intra articular injections, stating surgery is her only option , per her report. Apart from knee pain , she has absolutely no other complaints   Review of Systems .Denies recent fever or chills. Denies sinus pressure, nasal congestion, ear pain or sore throat. Denies chest congestion, productive cough or wheezing. Denies chest pains, palpitations, paroxysmal nocturnal dyspnea, orthopnea and leg swelling Denies abdominal pain, nausea, vomiting,diarrhea or constipation.  Denies rectal bleeding or change in bowel movement. Denies dysuria, frequency, hesitancy or incontinence.  Denies headaches, seizure, numbness, or tingling. Denies depression, anxiety or insomnia. Denies skin break down or rash. Though asymptomatic as far as UTI symptoms concerned, will check CCUA preoperatively and CXR also in house EKG       Objective:   Physical Exam Patient alert and oriented and in no Cardiopulmonary distress.  HEENT: No facial asymmetry, EOMI, no sinus tenderness, TM's clear, Oropharynx pink and moist.  Neck supple no adenopathy.  Chest: Clear to auscultation bilaterally.  CVS: S1, S2 no murmurs, no S3.  ABD: Soft non tender. Bowel sounds normal.  Ext: No edema  MS: Adequate ROM spine, shoulders, hips and reduced in right knee which is deformed  Skin: Intact, no ulcerations or rash noted.  Psych: Good eye contact, normal affect. Memory intact not anxious or depressed appearing.  CNS: CN 2-12 intact, power, tone and sensation normal throughout.        Assessment & Plan:

## 2010-07-13 LAB — URINE CULTURE

## 2010-07-14 ENCOUNTER — Encounter: Payer: Self-pay | Admitting: Family Medicine

## 2010-07-14 NOTE — Assessment & Plan Note (Signed)
Uncontrolled. Medication compliance addressed. Commitment to regular exercise, and healthy  eating habits with portion control discussed. DASH diet, and low fat diet discussed, and literature offered. No changes in medication at this time.  

## 2010-07-14 NOTE — Assessment & Plan Note (Signed)
Deteriorated; pt here for medical clearance for replacement

## 2010-07-14 NOTE — Assessment & Plan Note (Signed)
Controlled, no change in medication  

## 2010-07-15 ENCOUNTER — Other Ambulatory Visit: Payer: Self-pay | Admitting: Family Medicine

## 2010-07-15 DIAGNOSIS — R5383 Other fatigue: Secondary | ICD-10-CM

## 2010-07-15 DIAGNOSIS — R5381 Other malaise: Secondary | ICD-10-CM

## 2010-07-17 ENCOUNTER — Other Ambulatory Visit: Payer: Self-pay

## 2010-07-17 MED ORDER — SIMVASTATIN 10 MG PO TABS: 10.0000 mg | ORAL_TABLET | Freq: Every day | ORAL | Status: DC

## 2010-07-18 ENCOUNTER — Other Ambulatory Visit: Payer: Self-pay | Admitting: Family Medicine

## 2010-07-19 LAB — CBC WITH DIFFERENTIAL/PLATELET
Eosinophils Relative: 3 % (ref 0–5)
Lymphocytes Relative: 33 % (ref 12–46)
Lymphs Abs: 2.1 10*3/uL (ref 0.7–4.0)
MCV: 80.9 fL (ref 78.0–100.0)
Neutro Abs: 3.6 10*3/uL (ref 1.7–7.7)
Platelets: 262 10*3/uL (ref 150–400)
RBC: 4.7 MIL/uL (ref 3.87–5.11)
WBC: 6.5 10*3/uL (ref 4.0–10.5)

## 2010-07-23 ENCOUNTER — Telehealth: Payer: Self-pay | Admitting: Family Medicine

## 2010-07-23 DIAGNOSIS — Z01818 Encounter for other preprocedural examination: Secondary | ICD-10-CM

## 2010-07-23 NOTE — Telephone Encounter (Signed)
Pt has appt at [facility] cardiology on 07/25/2010 2:45. Pt was called and is aware of this appt

## 2010-07-24 ENCOUNTER — Encounter: Payer: Self-pay | Admitting: Family Medicine

## 2010-07-30 NOTE — Discharge Summary (Signed)
NAMEMELAH, Lori Stein           ACCOUNT NO.:  [id number]   MEDICAL RECORD NO.:  [id number]          PATIENT TYPE:  INP   LOCATION:  A209                          FACILITY:  [facility]   PHYSICIAN:  Dorris Singh, DO    DATE OF BIRTH:  1932/12/11   DATE OF ADMISSION:  11/08/2006  DATE OF DISCHARGE:  08/25/2008LH                               DISCHARGE SUMMARY   ADMISSION DIAGNOSES:  1. Chest pain.  2. Nausea, vomiting.  3. Hypertension.  4. Hypercholesterolemia.  5. Hypokalemia.   DISCHARGE DIAGNOSES:  1. Chest pain.  2. Hypercholesterolemia.   CONSULTATIONS:  [facility], Dr. Dietrich Pates.   PROCEDURES:  Include and EKG which demonstrated sinus bradycardia and a  chest x-ray which demonstrated cardiomegaly, vascular congestion and  basilar atelectasis.   HISTORY OF PRESENT ILLNESS:  The patient is a 77 year old African-  American female who presented to [facility] complaining  of chest pain. She had nausea and vomiting and it was rated an 8/10.  Nothing made it better so she came in to be evaluated. Upon evaluation,  all of her cardiac enzymes were normal. Her EKG was normal. Her white  count was 9.8, hemoglobin 14.4, hematocrit 41.8. Her BUN was 14 and her  creatinine was 71. Her sodium was 139 and her potassium is 3.1.   At that point in time it was decided that she would be admitted for  atypical chest pain due to her aggravating symptoms and hypokalemia.   HOSPITAL COURSE:  She was admitted to the service of [facility] with [facility] to consult. She had sublingual nitroglycerin ordered as  well as a 2D echo. She was on GI and DVT prophylaxis. Monitored her CBC  and CMP. Also replaced her potassium and continued to monitor patient.  She had a 2D echo which per verbal interpretation did not show any  abnormalities and from cardiology the patient was okay to discharge.   The patient is discharged in stable condition to home. She is to follow  up on aspirin, 1 aspirin daily and to follow up with Dr. Lodema Hong within  1 week.   DISCHARGE MEDICATIONS:  The medications that she will go home on include  1. HCTZ 25 mg 1 orally daily.  2. Potassium chloride 20 mEq 1 orally daily.  3. Simvastatin 10 mg 1 orally daily.   DISCHARGE INSTRUCTIONS:  She has been instructed if this repeats itself  for the patient to RTC to the emergency room.      Dorris Singh, DO  Electronically Signed     CB/MEDQ  D:  11/09/2006  T:  11/10/2006  Job:  [id number]

## 2010-07-30 NOTE — Letter (Signed)
October 28, 2007    Milus Mallick. Lodema Hong, M.D.  [address]  [place], [place] [address]   RE:  Lori, PLACZEK  MRN:  [id number]  /  DOB:  1932/06/16   Dear Lori Stein:   It was my pleasure evaluating Lori Stein in the office today at your  request prior to planned total left knee arthroplasty.  As you know,  this nice woman has no prior cardiovascular history.  She has not  previously been evaluated by a cardiologist nor undergone any  significant cardiac testing.  She does have multiple cardiovascular risk  factors, most notably hypertension and hyperlipidemia.  These have been  well managed under your care.  She reports no dyspnea, no chest  discomfort.  Exertion is limited due to her orthopedic issues.   CURRENT MEDICATIONS:  1. Os-Cal.  2. Aspirin.  3. Multivitamin.  4. Fish oil.  5. HCTZ 25 mg daily.  6. KCl 20 mEq daily.  7. Simvastatin 10 mg daily.  8. She uses over-the-counter analgesics as well as tramadol on a      p.r.n. basis.   ALLERGIES:  She reports an allergy to PENICILLIN.   PAST MEDICAL HISTORY:  She has been hospitalized on only one occasion  for GI symptoms, for which no important pathology was identified.  She  has no prior surgical procedures.   SOCIAL HISTORY:  Retired; married, with 3 adult children, and lives  locally; no use of tobacco products nor alcohol.   FAMILY HISTORY:  Father died at age 23 with severe hypertension.  Mother  died at age 100 with an aneurysm.  She has 2 siblings.  Her brother is  deceased due to diabetes.   REVIEW OF SYSTEMS:  Notable for the need for corrective lenses for near  vision, upper and lower dentures, stable weight and appetite on a  regular diet and diffuse arthritic discomfort.  All other systems  reviewed, and are negative.   PHYSICAL EXAMINATION:  GENERAL:  A very pleasant woman, in no acute  distress.  VITAL SIGNS:  The weight is 177 pounds.  Blood pressure 110/70, heart  rate 65 and  regular, respirations 12 and unlabored.  HEENT:  Anicteric sclerae; normal lids and conjunctivae.  NECK:  No jugular venous distention; no carotid bruits.  LUNGS:  Clear.  CARDIAC:  Normal first and second heart sound; fourth heart sound  present; multiple premature beats.  ABDOMEN:  Soft and nontender; no masses; no organomegaly.  EXTREMITIES:  Burn with substantial loss of soft tissue over the medial  aspect of the left leg.   EKG:  Normal sinus rhythm; PAC; prominent voltage; otherwise normal.   LABORATORY DATA:  Laboratory from your office includes a normal CBC,  normal chemistry profile, and an acceptable lipid profile with total  cholesterol of 190, HDL 58, and LDL of 118.   IMPRESSION:  Lori Stein has a lower than age - appropriate risk for  the proposed surgery.  In the absence of symptoms, she does not require  any further testing preoperatively.  Her current medications appear  appropriate except that since she is taking simvastatin, a higher dose  would be appropriate to further lower LDL.  Her dose will be increased  to 40 mg daily.   She has frequent premature atrial contractions.  These are most likely  benign and do not require further assessment.  Please let me know at any  time that I can further assist in the care  of this nice woman.    Sincerely,     Gerrit Friends. Dietrich Pates, MD, [facility]  Electronically Signed   RMR/MedQ  DD: 10/29/2007  DT: 10/30/2007  Job #: [id number]   CC:    Claude Manges. Cleophas Dunker, M.D.

## 2010-07-30 NOTE — Consult Note (Signed)
Lori Stein, Lori Stein           ACCOUNT NO.:  [id number]   MEDICAL RECORD NO.:  [id number]          PATIENT TYPE:  INP   LOCATION:  A209                          FACILITY:  [facility]   PHYSICIAN:  Pricilla Riffle, MD, FACCDATE OF BIRTH:  23-Aug-1932   DATE OF CONSULTATION:  11/09/2006  DATE OF DISCHARGE:  11/09/2006                                 CONSULTATION   IDENTIFICATION:  The patient is a 77 year old who we are we are asked to  see regarding chest pain.   HISTORY OF PRESENT ILLNESS:  The patient has no known history of  coronary artery disease.  In 2005 she had a Myoview scan that showed  normal perfusion with some breast attenuation, EF of 70%.   Yesterday she came into the emergency room.  She woke up feeling a  little sick.  She became nauseated, began vomiting.  Her stomach hurt  and then her chest hurt.  She also noted some diarrhea.  She came to the  emergency room and in the emergency room was given nitroglycerin times  one sublingual, plus four baby aspirin.  Symptoms resolved.  Today she  has had some nausea, no chest pain.  No diarrhea.   The patient felt fine prior to coming into the hospital denied chest  pain.  No shortness of breath.  Bowels normal.   PAST MEDICAL HISTORY:  1. Hypertension.  2. Dyslipidemia.   ALLERGIES:  PENICILLIN.   PAST SURGICAL HISTORY:  Status post tubal ligation.  Status post skin  graft repair to a wound.   FAMILY HISTORY:  Is significant for CAD and father died of an MI at age  59.   SOCIAL HISTORY:  The patient does not smoke, does not drink.   REVIEW OF SYSTEMS:  All systems reviewed.  The patient has a headache in  the right-sided temporal region.  No cough.  No lung problems.  GI:  Again normal prior to admit.  RENAL:  Negative.  VASCULAR:  No edema.  Otherwise all systems reviewed and negative to the above problem except  as noted.   PHYSICAL EXAM:  GENERAL:  On exam the patient is in no acute distress.  Denies chest  pain.  VITAL SIGNS:  Blood pressure is 119-128 over 55-72, pulse has been in  the 50's to 70's, T-max of 99.4, O2 sat on 1 to 2 liters, 98%.  HEENT:  Normocephalic, atraumatic.  EOMI.  PERRL.  Throat clear.  Nares  clear.  Mucous membranes moist.  NECK:  JVP is normal.  No bruits.  No thyromegaly.  LUNGS:  Are clear to auscultation.  No wheezes or rales.  CARDIAC:  Regular rate and rhythm S1-S2.  No S3, S4 murmurs noted.  CHEST:  Nontender to palpation.  ABDOMEN:  Supple, nontender.  No hepatomegaly.  No masses.  Normal bowel  sounds.  EXTREMITIES:  2+ pulses throughout.  No lower extremity edema.   LABORATORY DATA:  A 12-lead EKG shows normal sinus bradycardia at 53  beats per minute.   Labs significant for a hemoglobin of 13, WBC is 7.4.  TSH 2.7.  BNP of  98, BUN and creatinine of 13 and 0.7, CK-MB 70/1.5; 60/1.3, troponin  0.04 x 2.  Cholesterol 179, LDL of 111, triglycerides of 92, HDL of 50.   Echocardiogram preliminary showed normal LV, RV function normal LV size  and thickness.   Chest x-ray shows cardiomegaly with mild vascular congestion.   IMPRESSION:  Chest pain.  Patient with no known coronary artery disease,  has some risk factors with history of dyslipidemia, hypertension, being  treated and family history as well as her age.  Her symptoms are  probably GI related with her presenting with nausea, vomiting, diarrhea.  Her EKG has been negative, enzymes are negative.  Echo is normal.   RECOMMENDATIONS:  Would continue aspirin 81 mg for now, continue home  medicines.  Would not schedule further testing unless symptoms worsen or  change.  We will make sure she has an outpatient follow-up in our clinic  to follow.  She was instructed to return to the emergency room if  worsens prior.  Okay to discharge home.  Would give nitroglycerin to  take as needed.      Pricilla Riffle, MD, [facility]  Electronically Signed     PVR/MEDQ  D:  11/09/2006  T:  11/10/2006  Job:   [id number]

## 2010-07-30 NOTE — H&P (Signed)
Lori Stein, Lori Stein           ACCOUNT NO.:  [id number]   MEDICAL RECORD NO.:  [id number]          PATIENT TYPE:  INP   LOCATION:  A209                          FACILITY:  [facility]   PHYSICIAN:  Dorris Singh, DO    DATE OF BIRTH:  February 15, 1933   DATE OF ADMISSION:  11/08/2006  DATE OF DISCHARGE:  LH                              HISTORY & PHYSICAL   CHIEF COMPLAINT:  Chest pain.   HISTORY OF PRESENT ILLNESS:  The patient is a 77 year old female who  presented to the emergency room, brought by her husband with a 1-day  complaint of chest pain.  The patient stated she was sitting at home and  started to have diarrhea and then started to get nauseated and felt pain  in her chest.  At that point in time, she noticed that it did not go  away.  It was rated an 8 out of 10.  And, there was nothing that made it  better, so she decided to come and be evaluated by the emergency  physician.   PAST MEDICAL HISTORY:  Significant for:  1. Hypertension.  2. Hypercholesterolemia.   SURGICAL HISTORY:  1. She had a tubal ligation.  2. Skin graft repair of a wound that was obtained from a car tail pipe      when she fell out of a van.   ALLERGIES:  PENICILLIN.   MEDICATIONS:  1. HCTZ 25 mg, one daily.  2. Potassium chloride 20 mEq once daily.  3. Simvastatin 10 mg daily.   REVIEW OF SYSTEMS:  GENERAL:  The patient is positive for weakness.  HEENT:  Negative for headache.  Negative for changes in vision.  Negative for any rhinorrhea.  Negative for sore throat and negative for  any neck masses.  CARDIOVASCULAR:  Positive for chest pain.  RESPIRATORY:  Positive for shortness of breath.  GASTROINTESTINAL:  Positive for nausea, vomiting, and diarrhea.  GU:  Negative.  MUSCULOSKELETAL:  Positive for weakness.  SKIN:  Negative for any  changes.  NEUROLOGIC:  Negative for headache.  ALLERGIC:  Negative for  any reactions.   PHYSICAL EXAMINATION:  VITAL SIGNS:  As follows, blood pressure 117/59,  pulse rate 60, respirations 20, and temperature 98.  GENERAL:  The patient generally is a 77 year old [place] female  who is well nourished, well developed in no acute distress.  She is  pleasant and appropriate.  Affect is appropriate.  SKIN:  Positive circular skin scar on her right lower extremity.  No  bruises, tattoos.  Hair consistency is within normal limits.  HEAD:  Normocephalic, atraumatic.  EYES:  Equal and reactive to light.  EOMI.  No conjunctival injection or  scleral icterus.  EARS:  Symmetrical.  No external canal discharge.  TMs are visualized  and gross auditory acuity is intact.  NOSE:  No discharge noted.  Positive nasal cannula.  MOUTH/THROAT:  No dentures noted.  Hygiene is fair.  No erythema or  exudate.  NECK:  No masses.  Full range of motion.  No tracheal deviation.  HEART:  Regular rate and rhythm.  No rubs, gallops,  or clicks.  LUNGS:  Clear to auscultation bilaterally.  No wheezes, crackles.  Chest  symmetrical with respirations.  ABDOMEN:  Soft, nontender, nondistended.  Bowel sounds in all four  quadrants.  No organomegaly.  No rebounding or guarding.  GU:  External genitalia within normal limits.  MUSCULOSKELETAL:  No muscular atrophy or weakness.  Full range of motion  of all joints.  NEUROLOGIC:  Cranial nerves II-XII grossly intact.  Strength is 5/5 and  reflexes are 5/5.   LABORATORY:  Sodium 139, potassium 3.1, chloride 102, carbon dioxide 32,  glucose 130, BUN 14, and creatinine 0.71.  CBC:  White count 9.8,  hemoglobin 14.4, hematocrit 41.8, platelet count of 237.  Cardiac  markers:  Myoglobin 41.6, CK-MB 1.2, and troponin 0.05.  She had a chest  x-ray done, cardiomegaly with vascular congestion, bibasilar  atelectasis.  EKG:  Normal sinus rhythm, normal axis, normal intervals,  normal QRS, normal ST and interpretation was normal.   ASSESSMENT/PLAN:  1. Chest pain, etiology unknown.  2. Hypertension.  3. Nausea and vomiting.  4.  Hyperkalemia.  5. Hypercholesterolemia.   PLAN:  1. Admit to telemetry.  2. We will have cardiology, [facility], consult.  3. We will also obtain a 2D echo.  4. We will put the patient on antiemetics for nausea and vomiting.  5. We will get daily labs.  Monitoring her few more cardiac markers as      well as CBC and CMP for any changes and to follow her electrolytes      for hypokalemia.  6. We will also put her on GI and DVT prophylaxis and we will continue      to monitor.  7. We will probably plan on stressing the patient to see if there is a      cardiac etiology for her chest pain.      Dorris Singh, DO  Electronically Signed     CB/MEDQ  D:  11/08/2006  T:  11/08/2006  Job:  [id number]

## 2010-07-30 NOTE — Procedures (Signed)
NAMESTARR, URIAS           ACCOUNT NO.:  [id number]   MEDICAL RECORD NO.:  [id number]          PATIENT TYPE:  INP   LOCATION:  A209                          FACILITY:  [facility]   PHYSICIAN:  Pricilla Riffle, MD, FACCDATE OF BIRTH:  04/18/1932   DATE OF PROCEDURE:  11/09/2006  DATE OF DISCHARGE:  11/09/2006                                ECHOCARDIOGRAM   TEST INDICATIONS:  The patient is a 77 year old with a history of chest  pain, hypertension, test to evaluate LV function.   A 2-D echo with echo Doppler.  Left ventricle is normal in size with an  end-diastolic dimension of 38 mm.  The interventricular septum and  posterior wall are normal at 10 mm each.   Left atrium is normal at 32 mm.  Right atrium and right ventricle are  normal.  Aortic root is grossly normal.   The aortic valve is mild to moderately sclerotic.  There is no stenosis.  No insufficiency.  Mitral valve is mildly thickened with mild  calcification.  There is no insufficiency.  Tricuspid valve is normal  with trace insufficiency.  Pulmonic valve is normal with no  insufficiency.   Overall LV systolic function is normal with an LVEF of approximately 60-  65%.  There are no definite wall motion abnormalities.   RV EF is normal.   There is no pericardial effusion.   Aortic arch is normal.      Pricilla Riffle, MD, [facility]  Electronically Signed     PVR/MEDQ  D:  11/09/2006  T:  11/10/2006  Job:  [id number]

## 2010-08-02 NOTE — Procedures (Signed)
NAME:  Lori Stein, STANDEN NO.:  [id number]   MEDICAL RECORD NO.:  [id number]           PATIENT TYPE:   LOCATION:                                 FACILITY:   PHYSICIAN:  Vida Roller, M.D.   DATE OF BIRTH:  [birth year]   DATE OF PROCEDURE:  DATE OF DISCHARGE:                                    STRESS TEST   HISTORY:  [name] is a 77 year old female with no known coronary artery  disease and atypical chest discomfort.  Her cardiac risk factors include  hypertension, hyperlipidemia, family history.   BASELINE DATA:  EKG revealed sinus bradycardia at 53 beats per minute,  nonspecific ST abnormalities.  Blood pressure is 122/82.   DESCRIPTION OF PROCEDURE:  The patient exercised for a total of 5 minutes, 5  seconds in Bruce protocol stage 1.  Stage 1 was held secondary to fatigue.  METS achieved were 4.6.  Maximal heart rate was 149 beats per minute, which  is 99% of predicted maximum.  The maximum blood pressure was 168/78.  The  patient reported no chest discomfort and no shortness of breath during  exercise.   EKG revealed no arrhythmias and no ischemic changes.   Final images and results are pending M.D. review.     Amy   AB/MEDQ  D:  12/26/2003  T:  12/26/2003  Job:  [id number]

## 2010-08-27 ENCOUNTER — Encounter: Payer: Self-pay | Admitting: Family Medicine

## 2010-09-12 ENCOUNTER — Ambulatory Visit: Payer: Self-pay | Admitting: Family Medicine

## 2010-09-12 ENCOUNTER — Encounter (HOSPITAL_COMMUNITY)
Admission: RE | Admit: 2010-09-12 | Discharge: 2010-09-12 | Disposition: A | Discharge status: Home / Self Care | Payer: Medicare Other | Source: Ambulatory Visit | Attending: Orthopaedic Surgery | Admitting: Orthopaedic Surgery

## 2010-09-12 ENCOUNTER — Telehealth: Payer: Self-pay | Admitting: Family Medicine

## 2010-09-12 LAB — COMPREHENSIVE METABOLIC PANEL
ALT: 13 U/L (ref 0–35)
Alkaline Phosphatase: 46 U/L (ref 39–117)
BUN: 25 mg/dL — ABNORMAL HIGH (ref 6–23)
CO2: 30 mEq/L (ref 19–32)
Calcium: 8.8 mg/dL (ref 8.4–10.5)
GFR calc Af Amer: >60 mL/min (ref >60)
GFR calc non Af Amer: >60 mL/min (ref >60)
Glucose, Bld: 111 mg/dL — ABNORMAL HIGH (ref 70–99)
Potassium: 3.6 mEq/L (ref 3.5–5.1)
Sodium: 142 mEq/L (ref 135–145)

## 2010-09-12 LAB — PROTIME-INR: Prothrombin Time: 12.9 seconds (ref 11.6–15.2)

## 2010-09-12 LAB — DIFFERENTIAL
Basophils Absolute: 0 10*3/uL (ref 0.0–0.1)
Eosinophils Relative: 2 % (ref 0–5)
Lymphocytes Relative: 29 % (ref 12–46)
Lymphs Abs: 2.2 10*3/uL (ref 0.7–4.0)
Monocytes Absolute: 0.6 10*3/uL (ref 0.1–1.0)
Monocytes Relative: 8 % (ref 3–12)
Neutro Abs: 4.6 10*3/uL (ref 1.7–7.7)

## 2010-09-12 LAB — ABO/RH: ABO/RH(D): A POS

## 2010-09-12 LAB — URINALYSIS, ROUTINE W REFLEX MICROSCOPIC
Bilirubin Urine: NEGATIVE
Glucose, UA: NEGATIVE mg/dL
Hgb urine dipstick: NEGATIVE
Protein, ur: NEGATIVE mg/dL

## 2010-09-12 LAB — TYPE AND SCREEN: ABO/RH(D): A POS

## 2010-09-12 LAB — APTT: aPTT: 28 seconds (ref 24–37)

## 2010-09-12 LAB — CBC
HCT: 36.7 % (ref 36.0–46.0)
Hemoglobin: 13.1 g/dL (ref 12.0–15.0)
MCH: 28.7 pg (ref 26.0–34.0)
MCHC: 35.7 g/dL (ref 30.0–36.0)
MCV: 80.5 fL (ref 78.0–100.0)
RDW: 14.6 % (ref 11.5–15.5)

## 2010-09-12 NOTE — Telephone Encounter (Signed)
Tried to spk with her , no answer, she needs to stay on all the rest of her medicine, stop the asprin till the surgeon says ok to resume, this is to reduce her risk of excessive bleeding pls explain

## 2010-09-13 LAB — URINE CULTURE
Colony Count: NO GROWTH
Culture  Setup Time: 201206282201
Culture: NO GROWTH

## 2010-09-13 NOTE — Telephone Encounter (Signed)
Patient aware.

## 2010-09-15 HISTORY — PX: JOINT REPLACEMENT: SHX530

## 2010-09-17 ENCOUNTER — Inpatient Hospital Stay (HOSPITAL_COMMUNITY)
Admission: RE | Admit: 2010-09-17 | Discharge: 2010-09-20 | DRG: 470 | Disposition: A | Discharge status: Skilled Nursing Facility | Payer: Medicare Other | Source: Ambulatory Visit | Attending: Orthopaedic Surgery | Admitting: Orthopaedic Surgery

## 2010-09-17 DIAGNOSIS — K560 Paralytic ileus: Secondary | ICD-10-CM | POA: Diagnosis not present

## 2010-09-17 DIAGNOSIS — E876 Hypokalemia: Secondary | ICD-10-CM | POA: Diagnosis present

## 2010-09-17 DIAGNOSIS — I10 Essential (primary) hypertension: Secondary | ICD-10-CM | POA: Diagnosis present

## 2010-09-17 DIAGNOSIS — D62 Acute posthemorrhagic anemia: Secondary | ICD-10-CM | POA: Diagnosis not present

## 2010-09-17 DIAGNOSIS — Z01812 Encounter for preprocedural laboratory examination: Secondary | ICD-10-CM

## 2010-09-17 DIAGNOSIS — K3184 Gastroparesis: Secondary | ICD-10-CM | POA: Diagnosis present

## 2010-09-17 DIAGNOSIS — M1710 Unilateral primary osteoarthritis, unspecified knee: Principal | ICD-10-CM | POA: Diagnosis present

## 2010-09-17 DIAGNOSIS — E669 Obesity, unspecified: Secondary | ICD-10-CM | POA: Diagnosis present

## 2010-09-18 ENCOUNTER — Inpatient Hospital Stay (HOSPITAL_COMMUNITY): Payer: Medicare Other

## 2010-09-18 LAB — BASIC METABOLIC PANEL
GFR calc Af Amer: >60 mL/min (ref >60)
GFR calc non Af Amer: >60 mL/min (ref >60)
Potassium: 3.3 mEq/L — ABNORMAL LOW (ref 3.5–5.1)
Sodium: 137 mEq/L (ref 135–145)

## 2010-09-18 LAB — CBC
MCHC: 36.4 g/dL — ABNORMAL HIGH (ref 30.0–36.0)
Platelets: 183 10*3/uL (ref 150–400)
RDW: 14.8 % (ref 11.5–15.5)

## 2010-09-19 LAB — URINALYSIS, MICROSCOPIC ONLY
Glucose, UA: NEGATIVE mg/dL
Hgb urine dipstick: NEGATIVE
Leukocytes, UA: NEGATIVE
Specific Gravity, Urine: 1.017 (ref 1.005–1.030)
Urobilinogen, UA: 1 mg/dL (ref 0.0–1.0)

## 2010-09-19 LAB — CBC
HCT: 29.4 % — ABNORMAL LOW (ref 36.0–46.0)
Hemoglobin: 10.7 g/dL — ABNORMAL LOW (ref 12.0–15.0)
RDW: 14.5 % (ref 11.5–15.5)
WBC: 12.4 10*3/uL — ABNORMAL HIGH (ref 4.0–10.5)

## 2010-09-19 LAB — BASIC METABOLIC PANEL
Chloride: 101 mEq/L (ref 96–112)
GFR calc Af Amer: >60 mL/min (ref >60)
GFR calc non Af Amer: >60 mL/min (ref >60)
Potassium: 3.8 mEq/L (ref 3.5–5.1)

## 2010-09-20 LAB — BASIC METABOLIC PANEL
Calcium: 9 mg/dL (ref 8.4–10.5)
GFR calc Af Amer: >60 mL/min (ref >60)
GFR calc non Af Amer: >60 mL/min (ref >60)
Glucose, Bld: 88 mg/dL (ref 70–99)
Potassium: 3.3 mEq/L — ABNORMAL LOW (ref 3.5–5.1)
Sodium: 138 mEq/L (ref 135–145)

## 2010-09-20 LAB — URINE CULTURE: Culture  Setup Time: 201207051436

## 2010-09-20 LAB — CBC
Hemoglobin: 10.4 g/dL — ABNORMAL LOW (ref 12.0–15.0)
MCH: 29.3 pg (ref 26.0–34.0)
MCHC: 36.9 g/dL — ABNORMAL HIGH (ref 30.0–36.0)
Platelets: 182 10*3/uL (ref 150–400)

## 2010-09-25 NOTE — Op Note (Signed)
NAMEAMENA, Lori Stein           ACCOUNT NO.:  [id number]  MEDICAL RECORD NO.:  [id number]  LOCATION:  5029                         FACILITY:  [facility]  PHYSICIAN:  Claude Manges. Chancey Cullinane, M.D.DATE OF BIRTH:  May 06, 1932  DATE OF PROCEDURE:  09/17/2010 DATE OF DISCHARGE:                              OPERATIVE REPORT   PREOPERATIVE DIAGNOSIS:  End-stage osteoarthritis, left knee.  POSTOPERATIVE DIAGNOSIS:  End-stage osteoarthritis, left knee.  PROCEDURE:  Left total knee replacement.  SURGEON:  Claude Manges. Cleophas Dunker, MD  ASSISTANT:  Oris Drone. Petrarca, PAC  ANESTHESIA:  General with supplemental femoral nerve block.  COMPLICATIONS:  None.  COMPONENTS:  DePuy LCS medium femoral component.  A #3 rotating keeled tibial tray.  A 12.5-mm bridging bearing.  A 3-pegged metal-backed rotating patella.  All were secured with polymethyl methacrylate.  DESCRIPTION OF PROCEDURE:  Ms. Lemus was met in the holding area, identified the left knee as the appropriate operative site and marked the knee with a pen.  She did receive a femoral nerve block.  She was then transported to room #1 and placed under general anesthesia without difficulty.  Nursing staff inserted a Foley catheter.  Urine was clear.  Tourniquet was then applied to the left thigh.  The leg was prepped with Betadine scrub and DuraPrep and the tourniquet to the midfoot.  Sterile draping was performed.  With the extremity still elevated, with Esmarch exsanguinated, with the proximal tourniquet at 350 mmHg.  A midline longitudinal incision was made centered about the patella, extending from the superior pouch to tibial tubercle via sharp dissection, incision was carried down to the subcutaneous tissue.  First layer of capsule was incised in the midline and medial parapatellar incision was made with the Bovie.  The joint was entered.  There is a clear yellow joint effusion.  The patella was everted to 180 degrees laterally.   The knee flexed to 90 degrees.  There was abundant beefy red synovitis.  A synovectomy was performed.  There are large osteophytes along the medial lateral femoral condyle and medial tibial plateau. These were removed with a rongeur.  We templated a medium-sized femur.  First cut was made transversely in the proximal tibia using the external tibial guide.  Subsequent cuts were then made on the femur using a 4- degree distal femoral valgus cut.  A 12.5-mm flexion/extension gaps were symmetrical.  MCL and LCL remained intact throughout the procedure.  The finishing cut was then made on the femur to provide tapers anteriorly and posteriorly.  Lamina spreaders were then inserted into the joint, medial lateral menisci were resected as well as the ACL and PCL.  Osteophytes removed from the posterior femoral condyle medially and laterally.  There are several large loose bodies that removed from both posterior medially and posterior lateral gutters.  Retractor were then placed around the tibia.  We measured a #3 tibial tray.  The center hole was made followed by the keeled cut.  With the tibial trial in place, a 12.5-mm bridging bearing was applied followed by the medium femoral condyle.  The knee was reduced and through full range of motion, there was perfect stability, negative anterior drawer sign.  We had excellent position of  the tibial component and the rotating platform.  The patella was prepared by removing approximately 8 mm of bone, leaving 12 mm of patella thickness.  Three pegs were then inserted followed by the trial patella.  Through full range of motion, the patella remained in its groove.  Trial components were removed.  We copiously irrigated the joint as we did throughout the procedure.  We changed gloves.  Final components were then impacted.  Retractors were placed around the tibia.  The tibial tray was then impacted with polymethyl methacrylate. Extraneous  methacrylate was removed from its periphery.  A 12.5-mm bridging bearing was then inserted followed by the cemented medium femur.  The knee was extended.  We had excellent fit of the components. Extraneous methacrylate was removed from all the edges.  The patella was applied with the methacrylate and the patella clamped. While waiting for the methacrylate to mature, we injected the joint with 0.25% Marcaine with epinephrine.  At approximately 15 minutes of methacrylate had matured and was hard, the joint was then inspected without evidence of any further loose material.  The tourniquet was then deflated.  Any bleeders were Bovie coagulated.  Medium-sized Hemovac was inserted.  The deep capsule was closed with interrupted #1 Ethibond, the superficial capsule with a running 0 Vicryl, subcu with 2-0 Vicryl and 3- 0 Monocryl.  Skin closed with skin clips.  Sterile bulky dressing was applied followed by the patient's support stocking.  The patient tolerated the procedure without complications.     Claude Manges. Cleophas Dunker, M.D.     PWW/MEDQ  D:  09/17/2010  T:  09/18/2010  Job:  [id number]  Electronically Signed by Norlene Campbell M.D. on 09/25/2010 11:42:24 AM

## 2010-10-11 NOTE — Discharge Summary (Signed)
[name], Stein           ACCOUNT NO.:  [id number]  MEDICAL RECORD NO.:  [id number]  LOCATION:  5029                         FACILITY:  [facility]  PHYSICIAN:  Claude Manges. Lori Stein, M.D.DATE OF BIRTH:  September 24, 1932  DATE OF ADMISSION:  09/17/2010 DATE OF DISCHARGE:  09/20/2010                        DISCHARGE SUMMARY - REFERRING   ADMISSION DIAGNOSIS:  Osteoarthritis of the left knee.  DISCHARGE DIAGNOSIS: 1. Osteoarthritis of the left knee. 2. Osteoarthritis, right knee. 3. History of hypertension. 4. History of increased cholesterol. 5. Obesity. 6. Acute blood loss anemia. 7. Hypokalemia. 8. Gastroparesis.  PROCEDURE:  Left total knee arthroplasty.  HISTORY:  Lori Stein is a very pleasant 77 year old female with several years history of bilateral knee pain.  She has had conservative treatment including corticosteroid injections.  Her pain is now intermittent, severe, sharp pain with chronic aching pain along with swelling.  Symptoms are worsened to the point where she had difficulty with sleep and activities of daily living.  Radiographic evidence stage OA of the left knee is noted.  She has failed conservative treatment. Indicated for left total knee arthroplasty.  HOSPITAL COURSE:  A 77 year old African-American female admitted, September 17, 2010, after appropriate laboratory studies were obtained as well as Ancef 2 grams IV on-call to the operating room, was taken to operating room where she underwent a left total knee arthroplasty by Dr. Norlene Campbell, assisted by Oris Drone. Petrarca, P.A.-C.  This involved a DePuy LCS medium femoral component with a #3 rotating keel tibial tray, a 12.5- mm bridging bearing, 3-Pegged metal-backed rotating patella, and all three were secured with Accolate.  She tolerated the procedure well. She was placed on Dilaudid PCA pump.  She was given IV Tylenol intraoperatively and q.6 h at 1 gram for four doses and then p.o. 1 gram every six  hours.  She was continued on Ancef 1 gram IV q.6 h x4 doses. Foley was placed intraoperatively.  She was started on CPM from 0 to 6 degrees for 8 hours per day increasing by 5 degrees per day. Consultations with PT for ambulation, 50% weightbearing on the left knee.  She was allowed out of bed to chair the following day.  Did have decreased bowel sounds that almost being nonexistent.  She was also having nausea and vomiting.  Diagnosis presumptively of either an ileus or gastroparesis was made and she was started on Reglan 5 mg p.o. q.8 h. Her nausea resolved.  She was allowed out of bed to chair and was ambulated by physical therapy.  Her PCA was weaned.  She was weaned off her O2.  She has continued to do well postoperatively and she is now planning for discharge on September 20, 2010 to the [facility].  LABORATORY STUDIES:  She was admitted with a hemoglobin 13.1, hematocrit 36.7%, white count 7700, platelets 221,000.  Hemoglobin of September 19, 2010 was 10.7, hematocrit 29.4%, white count 12,400, platelets were 181,000. Protime preop 12.9, INR of 0.95, PTT 28.  Preop sodium 142, potassium 3.6, chloride 103, CO2 of 30, glucose 111, BUN 25, creatinine 0.60.  GFR was greater than 60 throughout her hospital course.  Bilirubin total 0.3, alk phos 46, SGOT 14, SGPT 13, total protein 6.6, albumin  3.5, calcium was 8.8.  Her potassium dropped to 3.3 on September 18, 2010. Electrolytes from September 19, 2010, revealed a sodium of 138, potassium of 3.8, chloride 101, CO2 of 30, glucose 100, BUN 10, creatinine 0.52, and GFR greater than 60.  Preop urine was benign.  Urine of September 19, 2010, revealed rare squamous cell, 0-2 white cells.  Blood type was A+. Antibody screen negative.  MRSA and Staph aureus nasal swabs were negative.  Urine culture showed no growth.  Radiographic studies of September 18, 2010, that of her abdomen revealed no bowel obstruction, nonspecific bowel gas pattern.  DISCHARGE INSTRUCTIONS:  The med  reconciliation sheet has been printed and appropriate drugs noted.  Completed the post total joint discharge instruction sheet allowing for all of the ancillary orders associated with this total knee replacement.  These are included with discharge summary.  She will return back to the office in 2 weeks for recheck evaluation and staple removal.  She was discharged in improved condition.     Oris Drone Petrarca, P.A.-C.   ______________________________ Claude Manges. Cleophas Dunker, M.D.    BDP/MEDQ  D:  09/19/2010  T:  09/19/2010  Job:  [id number]  Electronically Signed by Jacqualine Code P.A.-C. on 10/02/2010 08:15:32 AM Electronically Signed by Norlene Campbell M.D. on 10/11/2010 04:50:46 PM

## 2010-10-18 ENCOUNTER — Telehealth: Payer: Self-pay | Admitting: Family Medicine

## 2010-10-21 ENCOUNTER — Other Ambulatory Visit: Payer: Self-pay | Admitting: Family Medicine

## 2010-10-21 NOTE — Telephone Encounter (Signed)
Have already sent msg to Dr regarding this patient

## 2010-10-21 NOTE — Telephone Encounter (Signed)
Script written historically only for one twice daily, pls send to pharmacy, note the dose has been stopped, the 10 meq tabs are easier to swallow

## 2010-10-22 ENCOUNTER — Other Ambulatory Visit: Payer: Self-pay

## 2010-10-22 MED ORDER — POTASSIUM CHLORIDE CRYS ER 10 MEQ PO TBCR: 10.0000 meq | EXTENDED_RELEASE_TABLET | Freq: Two times a day (BID) | ORAL | Status: DC

## 2010-10-22 NOTE — Telephone Encounter (Signed)
[facility] said patient is requesting potassium. They have never filled it for her before. It is on her medlist but I don't know if we are filling it for her or not. Can I send in a refill on it. Patient has called twice.

## 2010-12-10 ENCOUNTER — Encounter: Payer: Self-pay | Admitting: Family Medicine

## 2010-12-11 ENCOUNTER — Encounter: Payer: Self-pay | Admitting: Family Medicine

## 2010-12-11 ENCOUNTER — Ambulatory Visit (INDEPENDENT_AMBULATORY_CARE_PROVIDER_SITE_OTHER): Payer: Medicare Other | Admitting: Family Medicine

## 2010-12-11 VITALS — BP 130/78 | HR 76 | Resp 16 | Ht 65.0 in | Wt 168.1 lb

## 2010-12-11 DIAGNOSIS — R5383 Other fatigue: Secondary | ICD-10-CM

## 2010-12-11 DIAGNOSIS — M129 Arthropathy, unspecified: Secondary | ICD-10-CM

## 2010-12-11 DIAGNOSIS — M1990 Unspecified osteoarthritis, unspecified site: Secondary | ICD-10-CM

## 2010-12-11 DIAGNOSIS — I10 Essential (primary) hypertension: Secondary | ICD-10-CM

## 2010-12-11 DIAGNOSIS — R5381 Other malaise: Secondary | ICD-10-CM

## 2010-12-11 DIAGNOSIS — M1710 Unilateral primary osteoarthritis, unspecified knee: Secondary | ICD-10-CM

## 2010-12-11 DIAGNOSIS — E663 Overweight: Secondary | ICD-10-CM

## 2010-12-11 DIAGNOSIS — IMO0002 Reserved for concepts with insufficient information to code with codable children: Secondary | ICD-10-CM

## 2010-12-11 DIAGNOSIS — Z23 Encounter for immunization: Secondary | ICD-10-CM

## 2010-12-11 DIAGNOSIS — E785 Hyperlipidemia, unspecified: Secondary | ICD-10-CM

## 2010-12-11 LAB — LIPID PANEL
HDL: 60 mg/dL (ref >39)
LDL Cholesterol: 105 mg/dL — ABNORMAL HIGH (ref 0–99)
Total CHOL/HDL Ratio: 3 Ratio
VLDL: 14 mg/dL (ref 0–40)

## 2010-12-11 LAB — BASIC METABOLIC PANEL
CO2: 27 mEq/L (ref 19–32)
Calcium: 10 mg/dL (ref 8.4–10.5)
Glucose, Bld: 87 mg/dL (ref 70–99)
Potassium: 3.9 mEq/L (ref 3.5–5.3)
Sodium: 141 mEq/L (ref 135–145)

## 2010-12-11 LAB — HEPATIC FUNCTION PANEL
Albumin: 4.1 g/dL (ref 3.5–5.2)
Bilirubin, Direct: 0.1 mg/dL (ref 0.0–0.3)
Total Bilirubin: 0.7 mg/dL (ref 0.3–1.2)

## 2010-12-11 MED ORDER — INFLUENZA VAC TYPES A & B PF IM SUSP: 0.5000 mL | Freq: Once | INTRAMUSCULAR | Status: DC

## 2010-12-11 NOTE — Assessment & Plan Note (Signed)
Improved, following total knee replacement

## 2010-12-11 NOTE — Assessment & Plan Note (Signed)
Hyperlipidemia:Low fat diet discussed and encouraged.  Labs today and prior to next visit

## 2010-12-11 NOTE — Assessment & Plan Note (Signed)
Unchanged. Patient re-educated about  the importance of commitment to a  minimum of 150 minutes of exercise per week. The importance of healthy food choices with portion control discussed. Encouraged to start a food diary, count calories and to consider  joining a support group. Sample diet sheets offered. Goals set by the patient for the next several months.    

## 2010-12-11 NOTE — Assessment & Plan Note (Signed)
Controlled, no change in medication  

## 2010-12-11 NOTE — Progress Notes (Signed)
  Subjective:    Patient ID: Lori Stein, female    DOB: 01-27-1933, 77 y.o.   MRN: [id number]  HPI The PT is here for follow up and re-evaluation of chronic medical conditions, medication management and review of any available recent lab and radiology data.  Preventive health is updated, specifically  Cancer screening and Immunization.   Questions or concerns regarding consultations or procedures which the PT has had in the interim are  Addressed.She had left knee replacement in July of this year, states pain is much improvd, "I can sleep" The PT denies any adverse reactions to current medications since the last visit.  There are no new concerns.  There are no specific complaints      Review of Systems Denies recent fever or chills. Denies sinus pressure, nasal congestion, ear pain or sore throat. Denies chest congestion, productive cough or wheezing. Denies chest pains, palpitations and leg swelling Denies abdominal pain, nausea, vomiting,diarrhea or constipation.   Denies dysuria, frequency, hesitancy or incontinence. Denies headaches, seizures, numbness, or tingling. Denies depression, anxiety or insomnia. Denies skin break down or rash.        Objective:   Physical Exam  Patient alert and oriented and in no cardiopulmonary distress.  HEENT: No facial asymmetry, EOMI, no sinus tenderness,  oropharynx pink and moist.  Neck supple no adenopathy.  Chest: Clear to auscultation bilaterally.  CVS: S1, S2 no murmurs, no S3.  ABD: Soft non tender. Bowel sounds normal.  Ext: No edema  MS: Adequate ROM spine, shoulders, hips and reduced in  knees.  Skin: Intact, no ulcerations or rash noted.  Psych: Good eye contact, normal affect. Memory intact not anxious or depressed appearing.  CNS: CN 2-12 intact, power, tone and sensation normal throughout.       Assessment & Plan:

## 2010-12-11 NOTE — Patient Instructions (Addendum)
CPE in 5.5 months.  Lipid, hepatic ad chem 7 today.  Fasting lipid, hepatic, TSHin 5.5 months  Mammogram past due will schedule  Flu vaccine today

## 2010-12-27 LAB — CBC
HCT: 41.8
Hemoglobin: 13
MCHC: 34.5
MCV: 86.9
MCV: 87.2
Platelets: 237
RBC: 4.82
RDW: 14.8 — ABNORMAL HIGH
WBC: 9.8

## 2010-12-27 LAB — DIFFERENTIAL
Basophils Absolute: 0
Basophils Relative: 0
Eosinophils Absolute: 0.1
Lymphocytes Relative: 17
Lymphs Abs: 1.6
Monocytes Absolute: 0.7
Monocytes Relative: 4
Neutro Abs: 4.2
Neutrophils Relative %: 57
Neutrophils Relative %: 79 — ABNORMAL HIGH

## 2010-12-27 LAB — URINALYSIS, ROUTINE W REFLEX MICROSCOPIC
Ketones, ur: NEGATIVE
Nitrite: NEGATIVE
Protein, ur: NEGATIVE
Urobilinogen, UA: 0.2
pH: 7

## 2010-12-27 LAB — PROTIME-INR
INR: 0.9
Prothrombin Time: 12.3

## 2010-12-27 LAB — BASIC METABOLIC PANEL
BUN: 14
CO2: 32
Calcium: 9.6
Chloride: 102
Creatinine, Ser: 0.71
Creatinine, Ser: 0.72
GFR calc Af Amer: >60
GFR calc non Af Amer: >60
Glucose, Bld: 106 — ABNORMAL HIGH
Potassium: 3.1 — ABNORMAL LOW

## 2010-12-27 LAB — CARDIAC PANEL(CRET KIN+CKTOT+MB+TROPI)
Relative Index: INVALID
Relative Index: INVALID
Total CK: 70
Troponin I: 0.04

## 2010-12-27 LAB — LIPID PANEL: Triglycerides: 92

## 2010-12-27 LAB — POCT CARDIAC MARKERS
CKMB, poc: 1.2
Troponin i, poc: <0.05

## 2010-12-27 LAB — TSH: TSH: 2.706

## 2010-12-27 LAB — APTT: aPTT: 28

## 2011-05-12 ENCOUNTER — Other Ambulatory Visit: Payer: Self-pay | Admitting: Family Medicine

## 2011-05-21 ENCOUNTER — Encounter: Payer: Self-pay | Admitting: Family Medicine

## 2011-05-21 ENCOUNTER — Other Ambulatory Visit: Payer: Self-pay

## 2011-05-21 ENCOUNTER — Other Ambulatory Visit: Payer: Self-pay | Admitting: Family Medicine

## 2011-05-21 ENCOUNTER — Ambulatory Visit (INDEPENDENT_AMBULATORY_CARE_PROVIDER_SITE_OTHER): Payer: Medicare Other | Admitting: Family Medicine

## 2011-05-21 VITALS — BP 110/72 | HR 57 | Resp 16 | Ht 65.0 in | Wt 169.1 lb

## 2011-05-21 DIAGNOSIS — E663 Overweight: Secondary | ICD-10-CM

## 2011-05-21 DIAGNOSIS — I10 Essential (primary) hypertension: Secondary | ICD-10-CM

## 2011-05-21 DIAGNOSIS — Z139 Encounter for screening, unspecified: Secondary | ICD-10-CM

## 2011-05-21 DIAGNOSIS — E785 Hyperlipidemia, unspecified: Secondary | ICD-10-CM

## 2011-05-21 DIAGNOSIS — M1710 Unilateral primary osteoarthritis, unspecified knee: Secondary | ICD-10-CM

## 2011-05-21 DIAGNOSIS — IMO0002 Reserved for concepts with insufficient information to code with codable children: Secondary | ICD-10-CM

## 2011-05-21 LAB — COMPLETE METABOLIC PANEL WITH GFR
ALT: 18 U/L (ref 0–35)
AST: 21 U/L (ref 0–37)
CO2: 31 mEq/L (ref 19–32)
Chloride: 102 mEq/L (ref 96–112)
Creat: 0.61 mg/dL (ref 0.50–1.10)
GFR, Est Non African American: 88 mL/min
Potassium: 3.5 mEq/L (ref 3.5–5.3)

## 2011-05-21 LAB — TSH: TSH: 3.062 u[IU]/mL (ref 0.350–4.500)

## 2011-05-21 LAB — LIPID PANEL
Cholesterol: 185 mg/dL (ref 0–200)
HDL: 55 mg/dL (ref >39)
Total CHOL/HDL Ratio: 3.4 Ratio

## 2011-05-21 MED ORDER — HYDROCHLOROTHIAZIDE 25 MG PO TABS: 25.0000 mg | ORAL_TABLET | Freq: Every day | ORAL | Status: DC

## 2011-05-21 MED ORDER — POTASSIUM CHLORIDE CRYS ER 10 MEQ PO TBCR: 10.0000 meq | EXTENDED_RELEASE_TABLET | Freq: Two times a day (BID) | ORAL | Status: DC

## 2011-05-21 NOTE — Assessment & Plan Note (Signed)
Hyperlipidemia:Low fat diet discussed and encouraged.  Labs today, was well controled when last checked

## 2011-05-21 NOTE — Progress Notes (Signed)
  Subjective:    Patient ID: Lori Stein, female    DOB: [birth year], 78 y.o.   MRN: [id number]  HPI The PT is here for follow up and re-evaluation of chronic medical conditions, medication management and review of any available recent lab and radiology data.  Preventive health is updated, specifically  Cancer screening and Immunization.   Questions or concerns regarding consultations or procedures which the PT has had in the interim are  addressed. The PT denies any adverse reactions to current medications since the last visit.  There are no new concerns.  There are no specific complaints       Review of Systems See HPI Denies recent fever or chills. Denies sinus pressure, nasal congestion, ear pain or sore throat. Denies chest congestion, productive cough or wheezing. Denies chest pains, palpitations and leg swelling Denies abdominal pain, nausea, vomiting,diarrhea or constipation.   Denies dysuria, frequency, hesitancy or incontinence. Denies joint pain, swelling and limitation in mobility. Denies headaches, seizures, numbness, or tingling. Denies depression, anxiety or insomnia. Denies skin break down or rash.       Objective:   Physical Exam Patient alert and oriented and in no cardiopulmonary distress.  HEENT: No facial asymmetry, EOMI, no sinus tenderness,  oropharynx pink and moist.  Neck supple no adenopathy.  Chest: Clear to auscultation bilaterally.  CVS: S1, S2 no murmurs, no S3.  ABD: Soft non tender. Bowel sounds normal. Rectal: no mass , heme negative stool Ext: No edema  MS: decreased  ROM spine, shoulders, hips and knees.  Skin: Intact, no ulcerations or rash noted.  Psych: Good eye contact, normal affect. Memory intact not anxious or depressed appearing. Depression screen administered at OV , score is zero  CNS: CN 2-12 intact, power, tone and sensation normal throughout.        Assessment & Plan:

## 2011-05-21 NOTE — Assessment & Plan Note (Signed)
Doing well post right knee replacement, no injections needed at visit, and pt has had no falls

## 2011-05-21 NOTE — Assessment & Plan Note (Signed)
Unchanged. Patient re-educated about  the importance of commitment to a  minimum of 150 minutes of exercise per week. The importance of healthy food choices with portion control discussed. Encouraged to start a food diary, count calories and to consider  joining a support group. Sample diet sheets offered. Goals set by the patient for the next several months.    

## 2011-05-21 NOTE — Patient Instructions (Signed)
F/u in 6 month.  Please call if you need me before  Mammogram is past due and will be scheduled before you leave  You look well, blood pressure is excellent, and labs will be checked today.  Please call your insurance company to see if the shingles vaccine is covered, you need it and we have it   Medication will be refilled for 6 month

## 2011-05-21 NOTE — Assessment & Plan Note (Signed)
Controlled, no change in medication  

## 2011-05-26 ENCOUNTER — Ambulatory Visit (HOSPITAL_COMMUNITY)
Admission: RE | Admit: 2011-05-26 | Discharge: 2011-05-26 | Disposition: A | Discharge status: Home / Self Care | Payer: Medicare Other | Source: Ambulatory Visit | Attending: Family Medicine | Admitting: Family Medicine

## 2011-05-26 DIAGNOSIS — Z1231 Encounter for screening mammogram for malignant neoplasm of breast: Secondary | ICD-10-CM | POA: Insufficient documentation

## 2011-05-26 DIAGNOSIS — Z139 Encounter for screening, unspecified: Secondary | ICD-10-CM

## 2011-09-25 ENCOUNTER — Other Ambulatory Visit: Payer: Self-pay | Admitting: Family Medicine

## 2011-11-20 ENCOUNTER — Encounter: Payer: Self-pay | Admitting: Family Medicine

## 2011-11-20 ENCOUNTER — Ambulatory Visit (INDEPENDENT_AMBULATORY_CARE_PROVIDER_SITE_OTHER): Payer: Medicare Other | Admitting: Family Medicine

## 2011-11-20 VITALS — BP 138/74 | HR 67 | Resp 18 | Ht 65.0 in | Wt 168.1 lb

## 2011-11-20 DIAGNOSIS — E785 Hyperlipidemia, unspecified: Secondary | ICD-10-CM

## 2011-11-20 DIAGNOSIS — Z23 Encounter for immunization: Secondary | ICD-10-CM

## 2011-11-20 DIAGNOSIS — H6122 Impacted cerumen, left ear: Secondary | ICD-10-CM

## 2011-11-20 DIAGNOSIS — H6120 Impacted cerumen, unspecified ear: Secondary | ICD-10-CM

## 2011-11-20 DIAGNOSIS — F4321 Adjustment disorder with depressed mood: Secondary | ICD-10-CM

## 2011-11-20 DIAGNOSIS — I10 Essential (primary) hypertension: Secondary | ICD-10-CM

## 2011-11-20 DIAGNOSIS — E663 Overweight: Secondary | ICD-10-CM

## 2011-11-20 MED ORDER — MIRTAZAPINE 7.5 MG PO TABS: 7.5000 mg | ORAL_TABLET | Freq: Every day | ORAL | Status: DC

## 2011-11-20 NOTE — Patient Instructions (Addendum)
F/u mid November, please call if you need me before  New medication to be started short term to help with grief at your recent loss, as well as depression.   Please continue counseling with your pastor's wife. Accept my condolence at your recent loss.  Left ear irrigation today  Flu vaccine today

## 2011-11-20 NOTE — Progress Notes (Signed)
  Subjective:    Patient ID: Lori Stein, female    DOB: Dec 31, 1932, 78 y.o.   MRN: [id number]  HPI The PT is here for follow up and re-evaluation of chronic medical conditions, medication management and review of any available recent lab and radiology data.  Preventive health is updated, specifically  Cancer screening and Immunization.   Questions or concerns regarding consultations or procedures which the PT has had in the interim are  addressed. The PT denies any adverse reactions to current medications since the last visit.  C/o sadness, grief, poor appetite, poor sleep and crying spells after losing her spouse of 35 years after a fter a 4 month battle with lung cancer in July  C/o decreased hearing left ear    Review of Systems See HPI Denies recent fever or chills. Denies sinus pressure, nasal congestion, n or sore throat. Denies chest congestion, productive cough or wheezing. Denies chest pains, palpitations and leg swelling Denies abdominal pain, nausea, vomiting,diarrhea or constipation.   Denies dysuria, frequency, hesitancy or incontinence. Denies uncontrolled  joint pain, swelling and limitation in mobility. Denies headaches, seizures, numbness, or tingling. . Denies skin break down or rash.        Objective:   Physical Exam Patient alert and oriented and in no cardiopulmonary distress.Tearful, flat affect, poor eye contact  HEENT: No facial asymmetry, EOMI, no sinus tenderness,  oropharynx pink and moist.  Neck supple no adenopathy.Left ear has impacted cerumen, right ear TM clear  Chest: Clear to auscultation bilaterally.  CVS: S1, S2 no murmurs, no S3.  ABD: Soft non tender. Bowel sounds normal.  Ext: No edema  MS: Adequate though reduced ROM spine, shoulders, hips and knees.  Skin: Intact, no ulcerations or rash noted.  CNS: CN 2-12 intact, power, tone and sensation normal throughout.        Assessment & Plan:

## 2011-11-21 IMAGING — US US ABDOMEN COMPLETE
1 series · 13 of 25 positions shown · non-contrast
Comparison: None.

CLINICAL DATA: Post-prandial dyspepsia.

COMPLETE ABDOMINAL ULTRASOUND 11/02/2009:

[Series 1: us abdomen complete · 0.22mm/px · 13 of 97 slices shown]
[im 1/97]
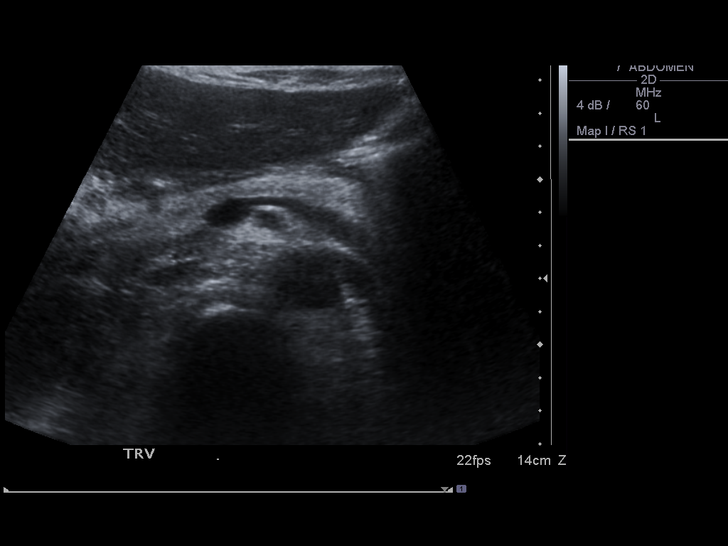
[im 9/97]
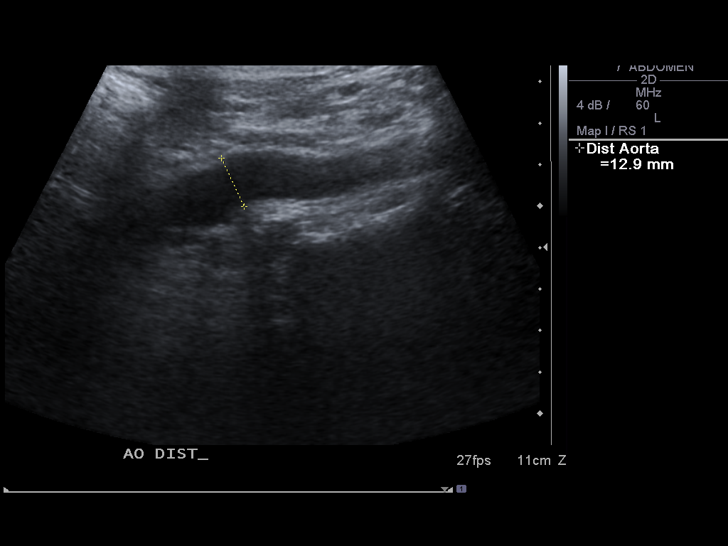
[im 17/97]
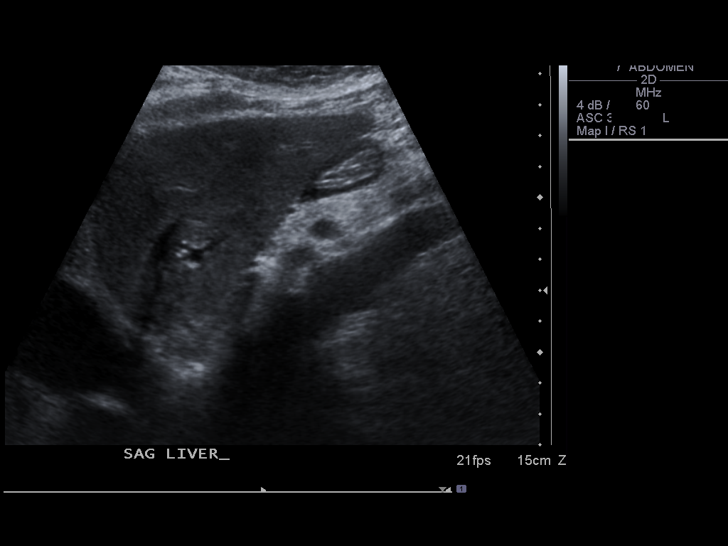
[im 25/97]
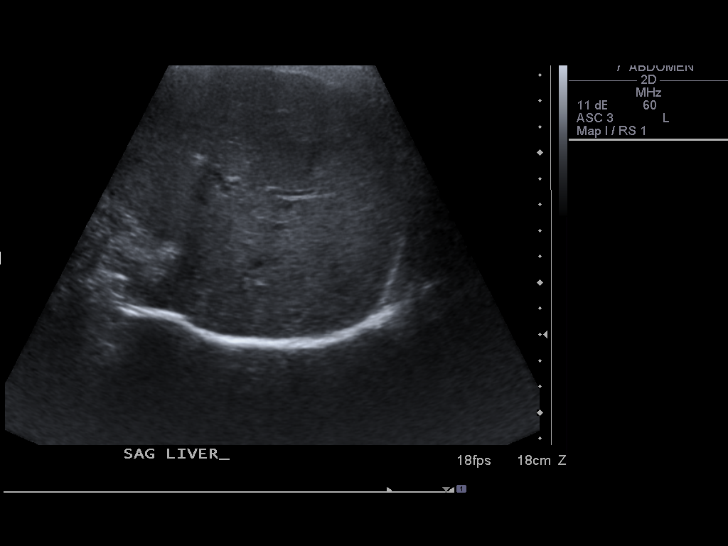
[im 33/97]
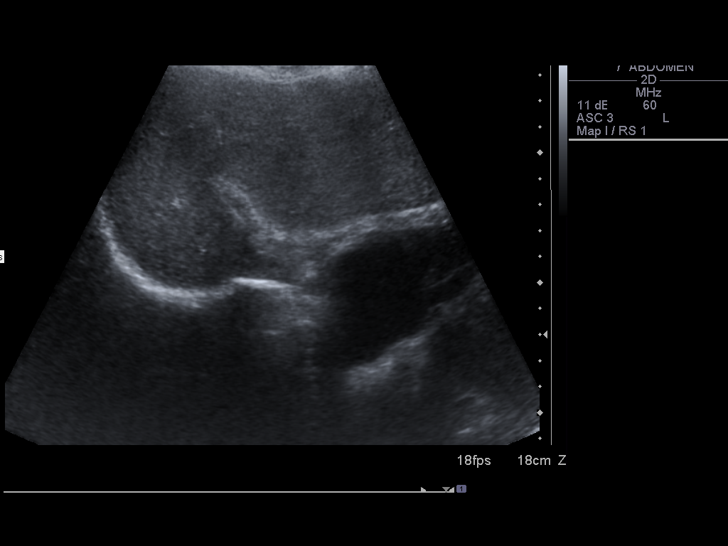
[im 41/97]
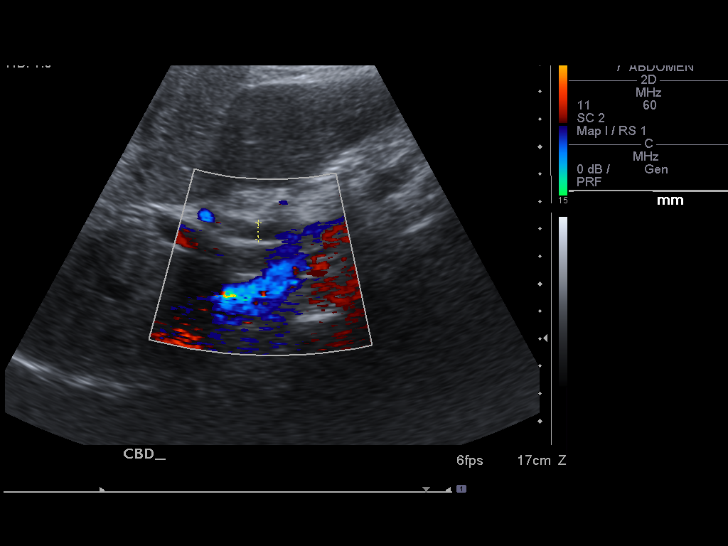
[im 49/97]
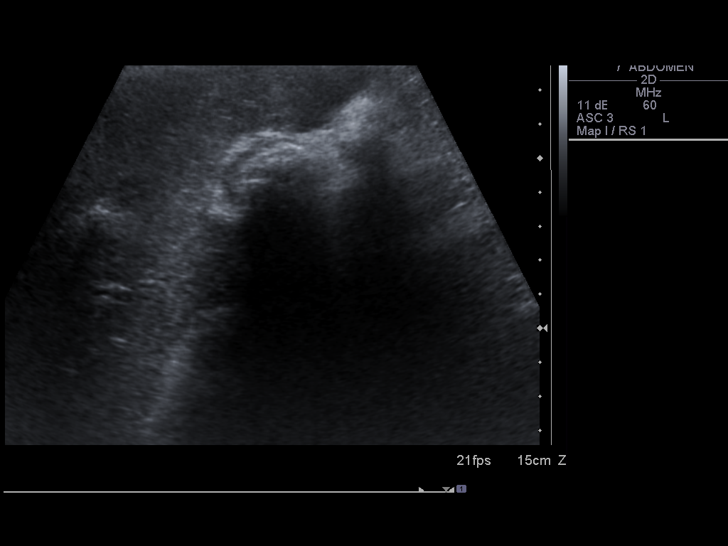
[im 57/97]
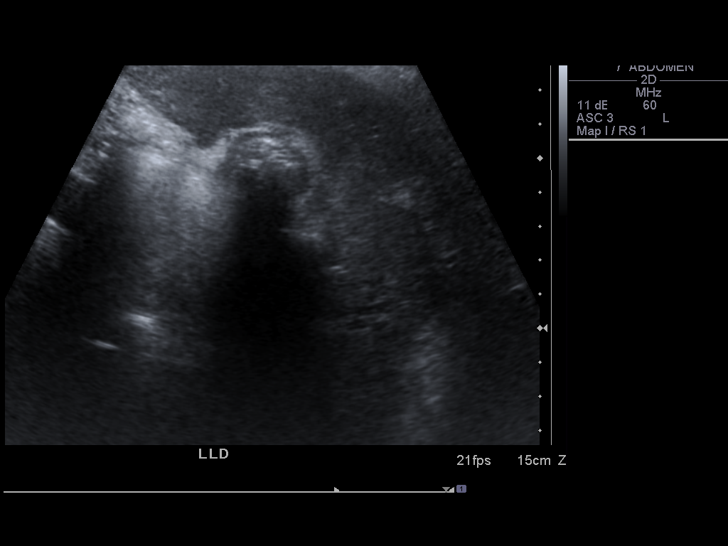
[im 65/97]
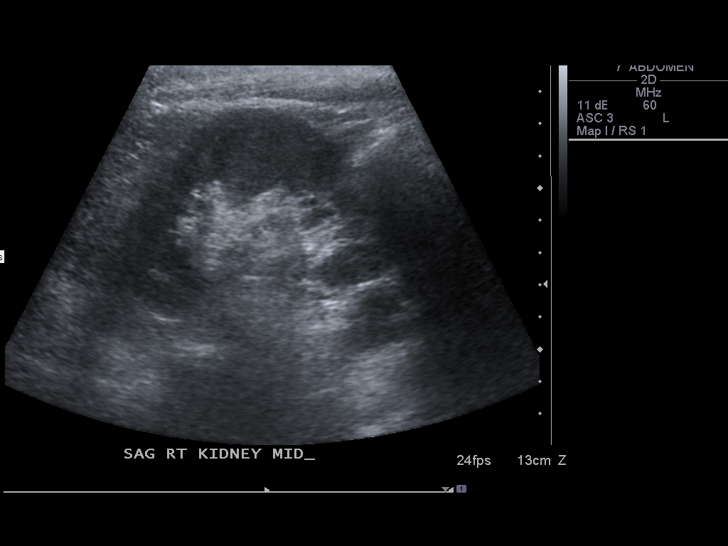
[im 73/97]
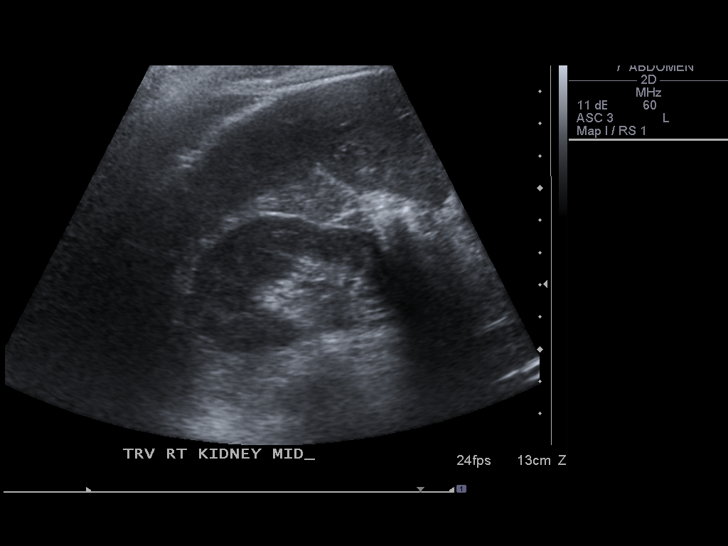
[im 81/97]
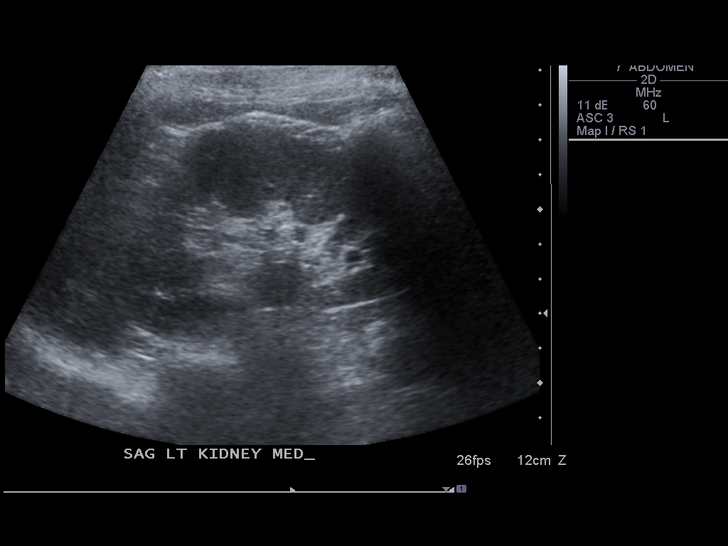
[im 89/97]
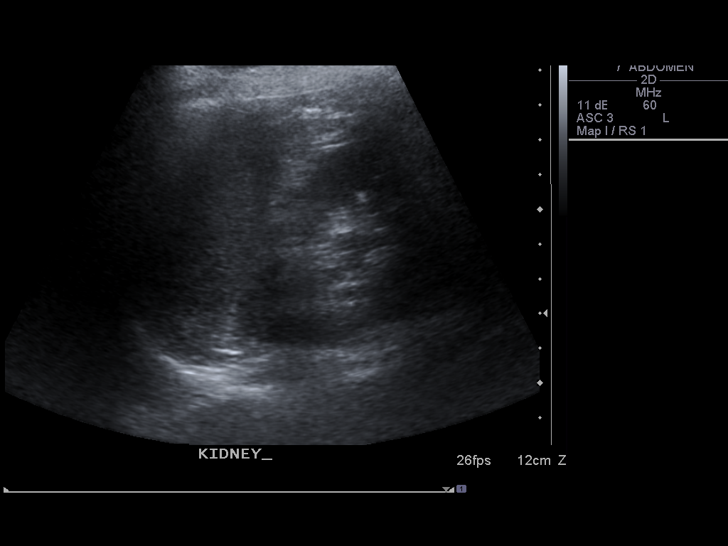
[im 97/97]
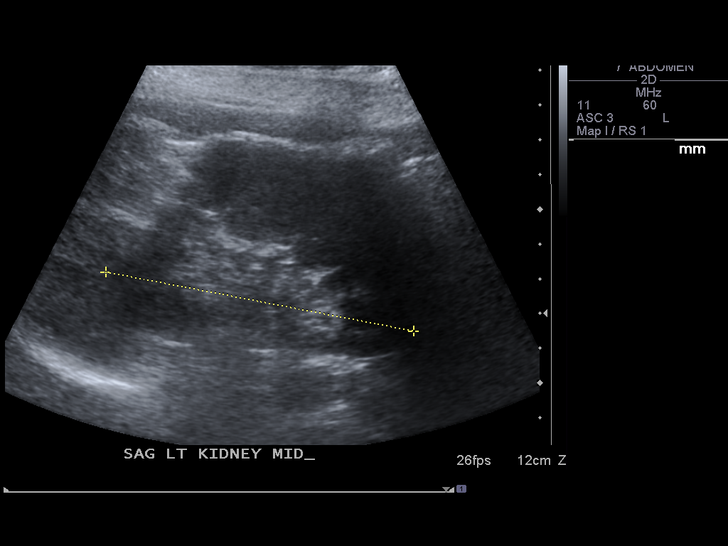

[13 of 25 positions shown; findings below may reference images not displayed]

FINDINGS: Gallbladder:  Numerous shadowing gallstones within a contracted
gallbladder.  Individual stones are difficult to measure though I
suspect they are on the order of 1 cm in size.  Mild gallbladder
wall thickening up to 5 mm.  No pericholecystic fluid.  Negative
sonographic Murphy's sign according to the ultrasound technologist.

Common bile duct:  Normal in caliber with maximum diameter
approximating 6 mm.

Liver:  Normal size and echotexture without focal parenchymal
abnormality.  Patent portal vein with hepatopetal flow.

IVC:  Patent.

Pancreas:  Normal size and echotexture without focal parenchymal
abnormality.

Spleen:  Normal size and echotexture without focal parenchymal
abnormality.

Right Kidney:  No hydronephrosis.  Well-preserved cortex.  No
shadowing calculi.  Normal size and parenchymal echotexture without
focal abnormalities.

Left Kidney:  No hydronephrosis.  Well-preserved cortex.  No
shadowing calculi.  Normal size and parenchymal echotexture without
focal abnormalities.

Abdominal aorta:  Mildly atherosclerotic but normal in caliber with
maximum diameter approximating 2 cm.
IMPRESSION: 1.  Cholelithiasis.  Contracted gallbladder containing numerous
gallstones.  Mild gallbladder wall thickening is consistent with
cholecystitis.
2.  Otherwise normal abdominal ultrasound.

## 2011-11-22 NOTE — Assessment & Plan Note (Signed)
Uncontrolled, with elevated LDL when las checked. Low fat diet discussed and encouraged. No med change at this time, updated lab before next visit

## 2011-11-22 NOTE — Assessment & Plan Note (Signed)
Improved

## 2011-11-22 NOTE — Assessment & Plan Note (Signed)
C/o left ear discomfort/fullness. Successful ear irrigation done, no trauma to drum or bleeding, tM clear post procedure

## 2011-11-22 NOTE — Assessment & Plan Note (Signed)
Controlled, no change in medication  

## 2011-11-22 NOTE — Assessment & Plan Note (Signed)
Short term use of antidepressant indicated, pt to continue counseling through her [address]

## 2012-02-02 ENCOUNTER — Ambulatory Visit (INDEPENDENT_AMBULATORY_CARE_PROVIDER_SITE_OTHER): Payer: Medicare Other | Admitting: Family Medicine

## 2012-02-02 ENCOUNTER — Encounter: Payer: Self-pay | Admitting: Family Medicine

## 2012-02-02 VITALS — BP 120/70 | HR 58 | Resp 16 | Ht 65.0 in | Wt 173.4 lb

## 2012-02-02 DIAGNOSIS — Z0000 Encounter for general adult medical examination without abnormal findings: Secondary | ICD-10-CM

## 2012-02-02 DIAGNOSIS — R5383 Other fatigue: Secondary | ICD-10-CM

## 2012-02-02 DIAGNOSIS — R5381 Other malaise: Secondary | ICD-10-CM

## 2012-02-02 DIAGNOSIS — I10 Essential (primary) hypertension: Secondary | ICD-10-CM

## 2012-02-02 DIAGNOSIS — E785 Hyperlipidemia, unspecified: Secondary | ICD-10-CM

## 2012-02-02 LAB — CBC WITH DIFFERENTIAL/PLATELET
Basophils Relative: 0 % (ref 0–1)
Hemoglobin: 14.5 g/dL (ref 12.0–15.0)
Lymphocytes Relative: 31 % (ref 12–46)
Lymphs Abs: 2 10*3/uL (ref 0.7–4.0)
Monocytes Relative: 7 % (ref 3–12)
Neutro Abs: 3.9 10*3/uL (ref 1.7–7.7)
Neutrophils Relative %: 61 % (ref 43–77)
RBC: 4.91 MIL/uL (ref 3.87–5.11)
WBC: 6.4 10*3/uL (ref 4.0–10.5)

## 2012-02-02 LAB — LIPID PANEL
LDL Cholesterol: 112 mg/dL — ABNORMAL HIGH (ref 0–99)
VLDL: 12 mg/dL (ref 0–40)

## 2012-02-02 LAB — COMPREHENSIVE METABOLIC PANEL
Albumin: 4.1 g/dL (ref 3.5–5.2)
Alkaline Phosphatase: 42 U/L (ref 39–117)
Glucose, Bld: 87 mg/dL (ref 70–99)
Potassium: 3.2 mEq/L — ABNORMAL LOW (ref 3.5–5.3)
Sodium: 140 mEq/L (ref 135–145)
Total Protein: 6.9 g/dL (ref 6.0–8.3)

## 2012-02-02 NOTE — Progress Notes (Signed)
Subjective:    Patient ID: Lori Stein, female    DOB: 12-16-1932, 78 y.o.   MRN: [id number]  HPI Preventive Screening-Counseling & Management   Patient present here today for a Medicare annual wellness visit.   Current Problems (verified)   Medications Prior to Visit Allergies (verified)   PAST HISTORY  Family History:5 sisters living, 2 sisters and 1 brother deceased.Father had an MI at age 62 he died as a result Brother was an alcoholic and diabetic and died at age 29, sisters died over age 75   Social History Widowed twice, was age 63 wih first spouse who died in an MVA. Widow in November 08, 2011, she had been married for over 30 years. Mother of 3 children, all involved with her. 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 No  Over the past two weeks, have you felt little interest or pleasure in doing things? No  Have you lost interest or pleasure in daily life? No  Do you often feel hopeless? No  Do you cry easily over simple problems? Sometimes still grieving recent loss of spouse   Activities of Daily Living  In your present state of health, do you have any difficulty performing the following activities?  Driving?: No Managing money?: No Feeding yourself?:No Getting from bed to chair?:No Climbing a flight of stairs?:No Preparing food and eating?:No Bathing or showering?:No Getting dressed?:No Getting to the toilet?:No Using the toilet?:No Moving around from place to place?: No  Fall Risk Assessment In the past year have you fallen or had a near  fall?:No Are you currently taking any medications that make you dizziness?:remeron made her light headed, so she has stopped   Hearing Difficulties: No Do you often ask people to speak up or repeat themselves?:No Do you experience ringing or noises in your ears?:No Do you have difficulty understanding soft or whispered voices?:No  Cognitive Testing  Alert? Yes Normal Appearance?Yes  Oriented to person? Yes Place? Yes  Time? Yes  Displays appropriate judgment?Yes  Can read the correct time from a watch face? yes Are you having problems remembering things?No  Advanced Directives have been discussed with the patient?Yes, pt unwilling and uncomfortable to address this due to current grieving situation , obviously became very sad and started getting teary eyed   List the Names of Other Physician/Practitioners you currently use: Dr. Mohammed Kindle [facility] [place]     Assessment:    Annual Wellness Exam   Plan:    During the course of the visit the patient was educated and counseled about appropriate screening and preventive services including:  A healthy diet is rich in fruit, vegetables and whole grains. Poultry fish, nuts and beans are a healthy choice for protein rather then red meat. A low sodium diet and drinking 64 ounces of water daily is generally recommended. Oils and sweet should be limited. Carbohydrates especially for those who are diabetic or overweight, should be limited to 30-45 gram per meal. It is important to eat on a regular schedule, at least 3 times daily. Snacks should be primarily  fruits, vegetables or nuts. It is important that you exercise regularly at least 30 minutes 5 times a week. If you develop chest pain, have severe difficulty breathing, or feel very tired, stop exercising immediately and seek medical attention  Immunization reviewed and updated. Cancer screening reviewed and updated    Patient Instructions (the written plan) was given to the  patient.  Medicare Attestation  I have personally reviewed:  The patient's medical and social history  Their use of alcohol, tobacco or illicit drugs  Their current medications and supplements  The patient's functional ability including ADLs,fall risks, home safety risks, cognitive, and hearing and visual impairment  Diet and physical activities  Evidence for depression or mood disorders  The patient's weight, height, BMI, and visual acuity have been recorded in the chart. I have made referrals, counseling, and provided education to the patient based on review of the above and I have provided the patient with a written personalized care plan for preventive services.      Review of Systems     Objective:   Physical Exam        Assessment & Plan:

## 2012-02-02 NOTE — Patient Instructions (Addendum)
F/u in 5.5 month, call if you ned me before  All the very best for the rest of this year into 2014   Fasting lip[id, cmp and CBc today

## 2012-02-02 NOTE — Assessment & Plan Note (Signed)
Annual wellness exam performed. Pt is still struggling with the grief of losing her spouse. She is not suicidal or homicidal and reports involvement with her children as well as in [place]. Had discontinued remeron , states it made her dizzy. Not suicidal or homicidal, has improved since last visit and wants to deal with her loss "naturally' No meds or therapy She has no limitations in ability to live independently and care for herself. She was absolutely unable at this visit to emotionally handle end of life discussions, she broke down become tearful and had very little eye contact and became silent

## 2012-02-03 ENCOUNTER — Other Ambulatory Visit: Payer: Self-pay | Admitting: Family Medicine

## 2012-02-13 ENCOUNTER — Other Ambulatory Visit: Payer: Self-pay | Admitting: Family Medicine

## 2012-02-20 ENCOUNTER — Other Ambulatory Visit: Payer: Self-pay

## 2012-02-20 MED ORDER — POTASSIUM CHLORIDE CRYS ER 20 MEQ PO TBCR: 20.0000 meq | EXTENDED_RELEASE_TABLET | Freq: Two times a day (BID) | ORAL | Status: DC

## 2012-02-23 ENCOUNTER — Telehealth: Payer: Self-pay | Admitting: Family Medicine

## 2012-02-23 NOTE — Telephone Encounter (Signed)
Clarified directions on potassium pills

## 2012-05-13 ENCOUNTER — Other Ambulatory Visit: Payer: Self-pay

## 2012-05-13 MED ORDER — HYDROCHLOROTHIAZIDE 25 MG PO TABS: ORAL_TABLET | ORAL | Status: DC

## 2012-05-18 ENCOUNTER — Other Ambulatory Visit: Payer: Self-pay | Admitting: Family Medicine

## 2012-07-20 ENCOUNTER — Other Ambulatory Visit: Payer: Self-pay | Admitting: Family Medicine

## 2012-07-20 DIAGNOSIS — Z139 Encounter for screening, unspecified: Secondary | ICD-10-CM

## 2012-07-23 ENCOUNTER — Other Ambulatory Visit: Payer: Self-pay | Admitting: Family Medicine

## 2012-07-27 ENCOUNTER — Encounter: Payer: Self-pay | Admitting: Family Medicine

## 2012-07-27 ENCOUNTER — Ambulatory Visit (INDEPENDENT_AMBULATORY_CARE_PROVIDER_SITE_OTHER): Payer: Medicare Other | Admitting: Family Medicine

## 2012-07-27 VITALS — BP 118/72 | HR 64 | Resp 18 | Ht 65.0 in | Wt 176.0 lb

## 2012-07-27 DIAGNOSIS — Z1212 Encounter for screening for malignant neoplasm of rectum: Secondary | ICD-10-CM

## 2012-07-27 DIAGNOSIS — F4321 Adjustment disorder with depressed mood: Secondary | ICD-10-CM

## 2012-07-27 DIAGNOSIS — E663 Overweight: Secondary | ICD-10-CM

## 2012-07-27 DIAGNOSIS — E785 Hyperlipidemia, unspecified: Secondary | ICD-10-CM

## 2012-07-27 DIAGNOSIS — Z1211 Encounter for screening for malignant neoplasm of colon: Secondary | ICD-10-CM

## 2012-07-27 DIAGNOSIS — I10 Essential (primary) hypertension: Secondary | ICD-10-CM

## 2012-07-27 LAB — LIPID PANEL
LDL Cholesterol: 115 mg/dL — ABNORMAL HIGH (ref 0–99)
Triglycerides: 64 mg/dL (ref <150)

## 2012-07-27 LAB — TSH: TSH: 3.371 u[IU]/mL (ref 0.350–4.500)

## 2012-07-27 NOTE — Progress Notes (Signed)
  Subjective:    Patient ID: Lori Stein, female    DOB: Mar 03, 1933, 79 y.o.   MRN: [id number]  HPI The PT is here for follow up and re-evaluation of chronic medical conditions, medication management and review of any available recent lab and radiology data.  Preventive health is updated, specifically  Cancer screening and Immunization.   Questions or concerns regarding consultations or procedures which the PT has had in the interim are  addressed. The PT denies any adverse reactions to current medications since the last visit.  There are no new concerns. Still experiencing grif over death of spouse, but improving There are no specific complaints       Review of Systems See HPI Denies recent fever or chills. Denies sinus pressure, nasal congestion, ear pain or sore throat. Denies chest congestion, productive cough or wheezing. Denies chest pains, palpitations and leg swelling Denies abdominal pain, nausea, vomiting,diarrhea or constipation.   Denies dysuria, frequency, hesitancy or incontinence. Denies uncontrolled  joint pain, swelling and limitation in mobility. Denies headaches, seizures, numbness, or tingling. Denies depression, anxiety or insomnia. Denies skin break down or rash.        Objective:   Physical Exam  Patient alert and oriented and in no cardiopulmonary distress.  HEENT: No facial asymmetry, EOMI, no sinus tenderness,  oropharynx pink and moist.  Neck supple no adenopathy.  Chest: Clear to auscultation bilaterally.  CVS: S1, S2 no murmurs, no S3.  ABD: Soft non tender. Bowel sounds normal. Rectal : no mass, heme negative stool Ext: No edema  MS: Adequate though reduced  ROM spine, shoulders, hips and knees.  Skin: Intact, no ulcerations or rash noted.  Psych: Good eye contact, normal affect. Memory intact not anxious or depressed appearing.  CNS: CN 2-12 intact, power, tone and sensation normal throughout.       Assessment & Plan:

## 2012-07-27 NOTE — Patient Instructions (Addendum)
F/U in 5.5 month, call if you need me before   Fasting lipid, cmp and TSH today   Rectal today.  Please reconsider the colonoscopy you should get one.   Shingles vaccine one time only is recommended, this is at the pharmacy

## 2012-07-29 IMAGING — CR DG CHEST 2V
2 series · 2 of 2 positions shown · non-contrast
Comparison: September 28, 2007

CLINICAL DATA: Preoperative respiratory exam; knee surgery

CHEST - 2 VIEW

[view not recorded (1 of 2)]
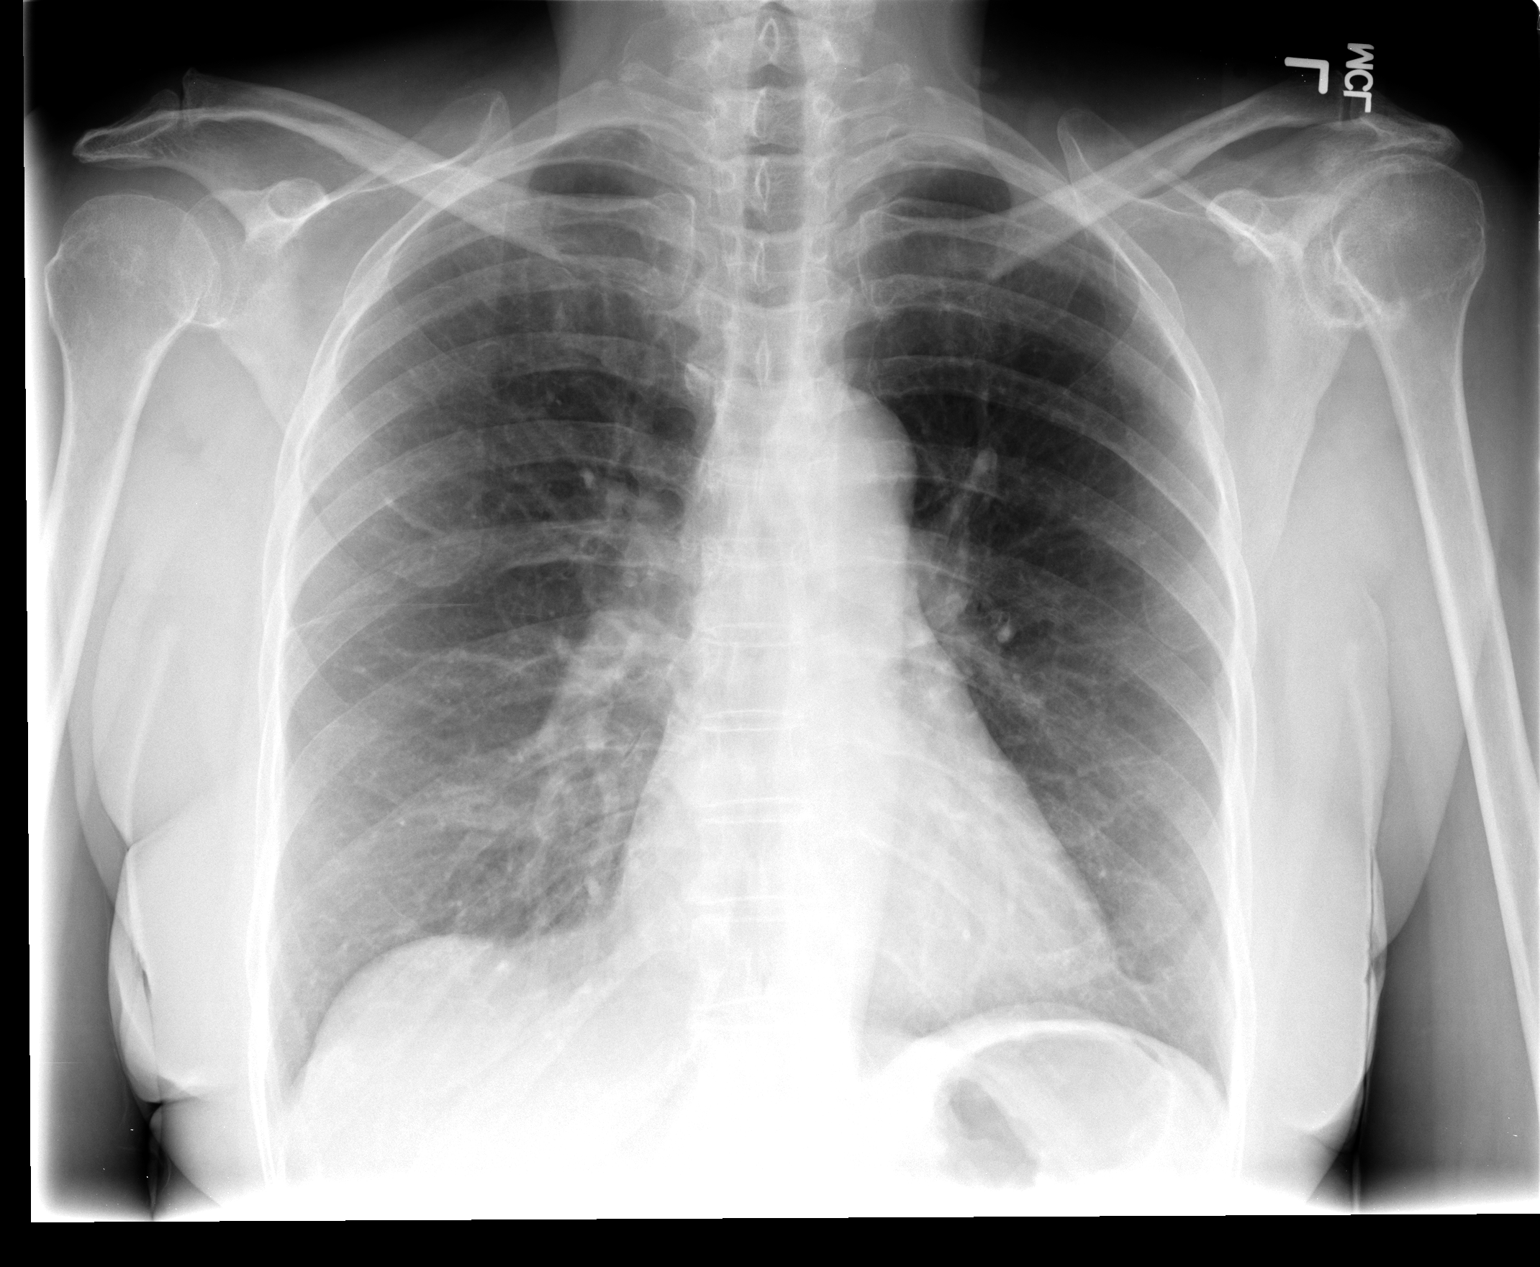

[view not recorded (2 of 2)]
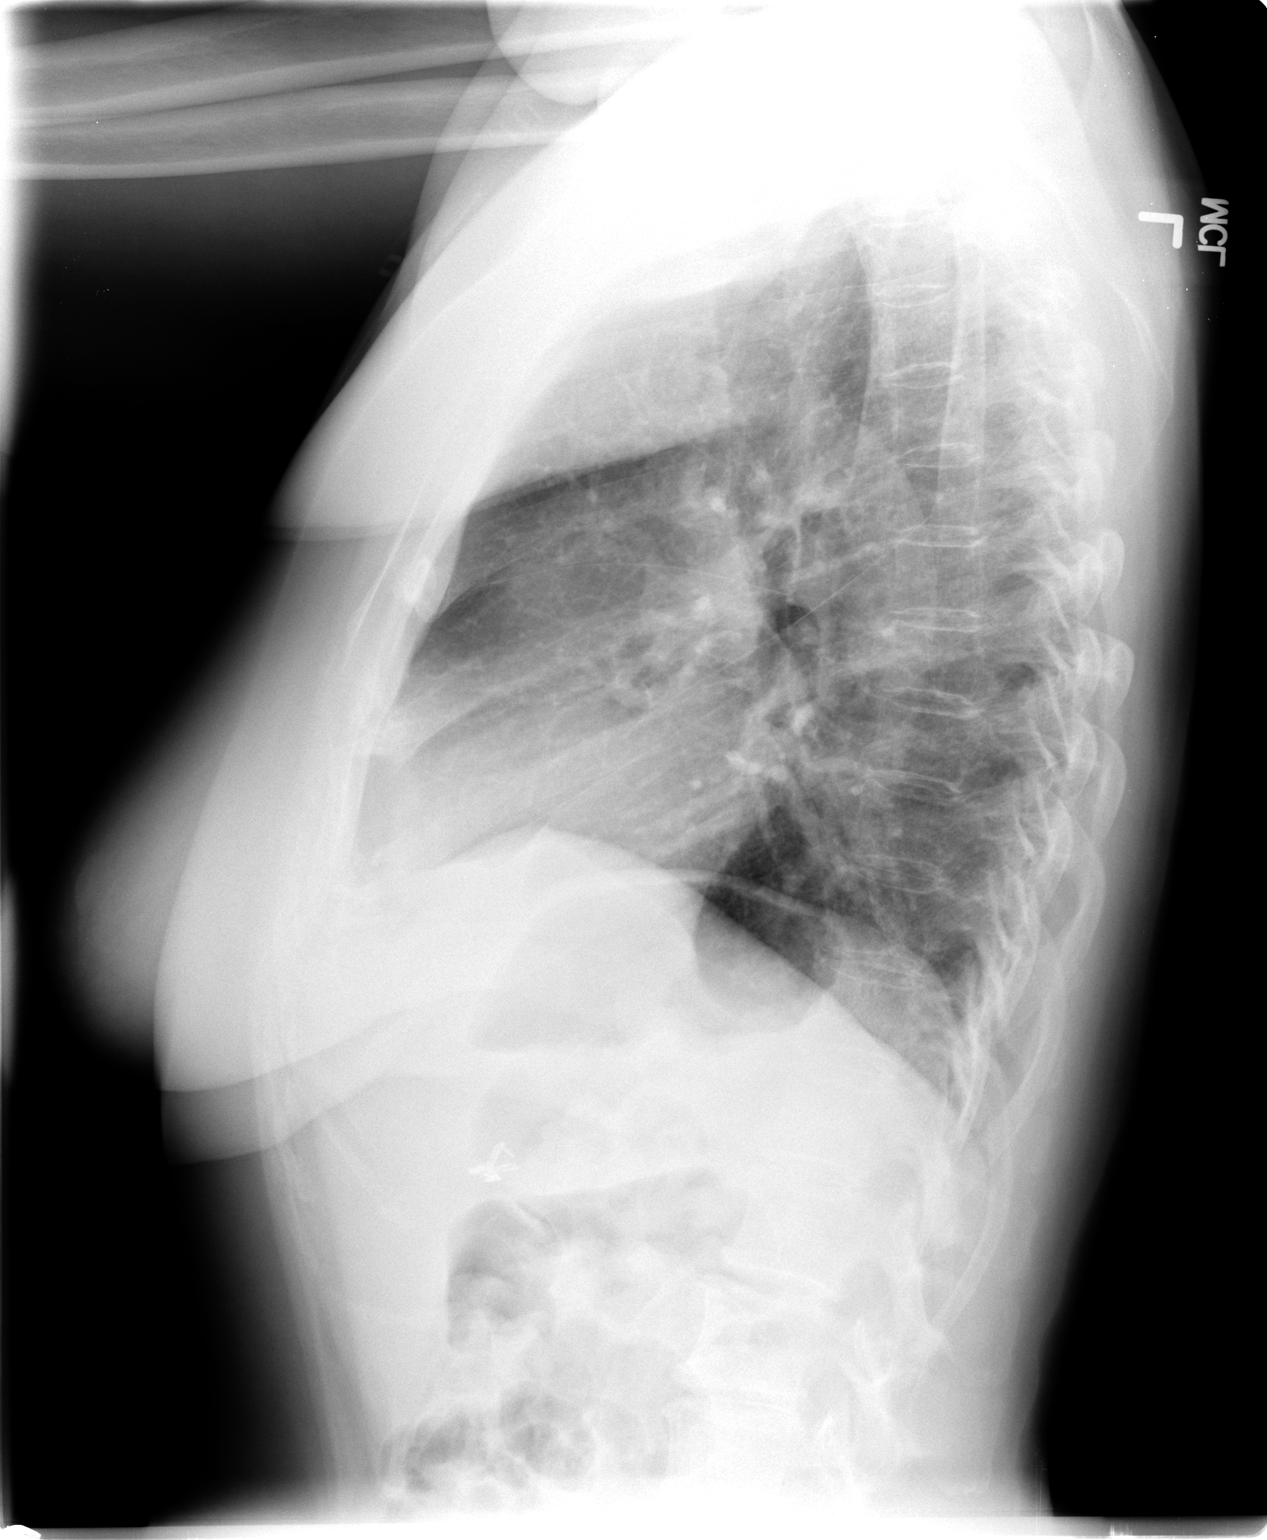

[2 of 2 positions shown; findings below may reference images not displayed]

FINDINGS: The cardiac silhouette, mediastinum, pulmonary
vasculature are within normal limits.  Both lungs are clear.
There is no acute bony abnormality.
IMPRESSION: There is no evidence of acute cardiac or pulmonary process.

## 2012-08-22 NOTE — Assessment & Plan Note (Signed)
Improving with time 

## 2012-08-22 NOTE — Assessment & Plan Note (Signed)
Controlled, no change in medication DASH diet and commitment to daily physical activity for a minimum of 30 minutes discussed and encouraged, as a part of hypertension management. The importance of attaining a healthy weight is also discussed.  

## 2012-08-22 NOTE — Assessment & Plan Note (Signed)
Improved. Pt applauded on succesful weight loss through lifestyle change, and encouraged to continue same. Weight loss goal set for the next several months.  

## 2012-08-22 NOTE — Assessment & Plan Note (Signed)
Elevated LDL Hyperlipidemia:Low fat diet discussed and encouraged.   

## 2012-08-23 LAB — POC HEMOCCULT BLD/STL (OFFICE/1-CARD/DIAGNOSTIC): Fecal Occult Blood, POC: NEGATIVE

## 2012-08-23 NOTE — Addendum Note (Signed)
Addended by: Kandis Fantasia B on: 08/23/2012 11:06 AM   Modules accepted: Orders

## 2012-08-30 ENCOUNTER — Ambulatory Visit (HOSPITAL_COMMUNITY)
Admission: RE | Admit: 2012-08-30 | Discharge: 2012-08-30 | Disposition: A | Discharge status: Home / Self Care | Payer: Medicare Other | Source: Ambulatory Visit | Attending: Family Medicine | Admitting: Family Medicine

## 2012-08-30 DIAGNOSIS — Z139 Encounter for screening, unspecified: Secondary | ICD-10-CM

## 2012-08-30 DIAGNOSIS — Z1231 Encounter for screening mammogram for malignant neoplasm of breast: Secondary | ICD-10-CM | POA: Insufficient documentation

## 2012-10-18 ENCOUNTER — Other Ambulatory Visit: Payer: Self-pay | Admitting: Family Medicine

## 2012-10-18 ENCOUNTER — Telehealth: Payer: Self-pay | Admitting: Family Medicine

## 2012-10-18 MED ORDER — POTASSIUM CHLORIDE ER 10 MEQ PO TBCR: EXTENDED_RELEASE_TABLET | ORAL | Status: DC

## 2012-10-18 MED ORDER — SIMVASTATIN 10 MG PO TABS: ORAL_TABLET | ORAL | Status: DC

## 2012-10-18 NOTE — Telephone Encounter (Signed)
Refills sent in

## 2012-12-22 ENCOUNTER — Telehealth: Payer: Self-pay | Admitting: Family Medicine

## 2012-12-22 DIAGNOSIS — Z139 Encounter for screening, unspecified: Secondary | ICD-10-CM

## 2012-12-22 DIAGNOSIS — R5381 Other malaise: Secondary | ICD-10-CM

## 2012-12-22 DIAGNOSIS — I10 Essential (primary) hypertension: Secondary | ICD-10-CM

## 2012-12-22 DIAGNOSIS — E785 Hyperlipidemia, unspecified: Secondary | ICD-10-CM

## 2012-12-22 NOTE — Telephone Encounter (Signed)
Cbc, fasting chem 7 and vit D

## 2012-12-22 NOTE — Telephone Encounter (Signed)
Left message that lab order was being faxed to the lab and it was ok to go the day of her appt so she wouldn't have to make two trips

## 2012-12-22 NOTE — Telephone Encounter (Signed)
You did not order any labs at her last visit. Wants to know if she needs any. Please advise

## 2012-12-27 ENCOUNTER — Encounter: Payer: Self-pay | Admitting: Family Medicine

## 2012-12-27 ENCOUNTER — Encounter (INDEPENDENT_AMBULATORY_CARE_PROVIDER_SITE_OTHER): Payer: Self-pay

## 2012-12-27 ENCOUNTER — Ambulatory Visit (INDEPENDENT_AMBULATORY_CARE_PROVIDER_SITE_OTHER): Payer: Medicare Other | Admitting: Family Medicine

## 2012-12-27 VITALS — BP 120/76 | HR 65 | Resp 16 | Ht 65.0 in | Wt 167.0 lb

## 2012-12-27 DIAGNOSIS — Z9119 Patient's noncompliance with other medical treatment and regimen: Secondary | ICD-10-CM

## 2012-12-27 DIAGNOSIS — I10 Essential (primary) hypertension: Secondary | ICD-10-CM

## 2012-12-27 DIAGNOSIS — E785 Hyperlipidemia, unspecified: Secondary | ICD-10-CM

## 2012-12-27 DIAGNOSIS — Z23 Encounter for immunization: Secondary | ICD-10-CM

## 2012-12-27 DIAGNOSIS — L84 Corns and callosities: Secondary | ICD-10-CM | POA: Insufficient documentation

## 2012-12-27 DIAGNOSIS — M6530 Trigger finger, unspecified finger: Secondary | ICD-10-CM

## 2012-12-27 DIAGNOSIS — M65311 Trigger thumb, right thumb: Secondary | ICD-10-CM

## 2012-12-27 DIAGNOSIS — Z91199 Patient's noncompliance with other medical treatment and regimen due to unspecified reason: Secondary | ICD-10-CM

## 2012-12-27 DIAGNOSIS — R5381 Other malaise: Secondary | ICD-10-CM

## 2012-12-27 LAB — CBC WITH DIFFERENTIAL/PLATELET
Eosinophils Absolute: 0.1 10*3/uL (ref 0.0–0.7)
Eosinophils Relative: 1 % (ref 0–5)
HCT: 42.7 % (ref 36.0–46.0)
Hemoglobin: 14.9 g/dL (ref 12.0–15.0)
Lymphocytes Relative: 33 % (ref 12–46)
Lymphs Abs: 2.2 10*3/uL (ref 0.7–4.0)
MCH: 29.6 pg (ref 26.0–34.0)
MCV: 84.9 fL (ref 78.0–100.0)
Monocytes Absolute: 0.4 10*3/uL (ref 0.1–1.0)
Monocytes Relative: 7 % (ref 3–12)
RBC: 5.03 MIL/uL (ref 3.87–5.11)
WBC: 6.8 10*3/uL (ref 4.0–10.5)

## 2012-12-27 NOTE — Patient Instructions (Addendum)
Annual wellness in March , call if you need me before  Fasting lipid, cmp CBc today   Flu vaccine today  You are referred to Dr Romeo Apple for trigger thumb  Reconsider your colonoscopy and bone density test, you need both

## 2012-12-27 NOTE — Assessment & Plan Note (Signed)
2nd left toe for months, wants to wait till next year, will call back

## 2012-12-28 LAB — LIPID PANEL
Cholesterol: 178 mg/dL (ref 0–200)
HDL: 55 mg/dL (ref >39)
Total CHOL/HDL Ratio: 3.2 Ratio
VLDL: 14 mg/dL (ref 0–40)

## 2012-12-28 LAB — COMPREHENSIVE METABOLIC PANEL
AST: 12 U/L (ref 0–37)
Albumin: 3.7 g/dL (ref 3.5–5.2)
Alkaline Phosphatase: 43 U/L (ref 39–117)
BUN: 18 mg/dL (ref 6–23)
Creat: 0.68 mg/dL (ref 0.50–1.10)
Glucose, Bld: 79 mg/dL (ref 70–99)
Potassium: 3.5 mEq/L (ref 3.5–5.3)
Total Bilirubin: 0.8 mg/dL (ref 0.3–1.2)

## 2013-01-02 DIAGNOSIS — Z91199 Patient's noncompliance with other medical treatment and regimen due to unspecified reason: Secondary | ICD-10-CM | POA: Insufficient documentation

## 2013-01-02 DIAGNOSIS — Z9119 Patient's noncompliance with other medical treatment and regimen: Secondary | ICD-10-CM | POA: Insufficient documentation

## 2013-01-02 NOTE — Assessment & Plan Note (Signed)
Improved , though LDL still elevated No med change Hyperlipidemia:Low fat diet discussed and encouraged.

## 2013-01-02 NOTE — Assessment & Plan Note (Signed)
Controlled, no change in medication DASH diet and commitment to daily physical activity for a minimum of 30 minutes discussed and encouraged, as a part of hypertension management. The importance of attaining a healthy weight is also discussed.  

## 2013-01-02 NOTE — Assessment & Plan Note (Signed)
Continues to refuse screening colonoscopy despite re education efforts repeatedly

## 2013-01-02 NOTE — Progress Notes (Signed)
  Subjective:    Patient ID: Lori Stein, female    DOB: 19-Jul-1932, 79 y.o.   MRN: [id number]  HPI The PT is here for follow up and re-evaluation of chronic medical conditions, medication management and review of any available recent lab and radiology data.  Preventive health is updated, specifically  Cancer screening and Immunization. Still refusing colonoscopy  The PT denies any adverse reactions to current medications since the last visit.  C/o right trigger thumb wants this attended to. Has corn on 2nd toe of left  foot but wants to hold off on this  States her grief is less but she will "never be the same"     Review of Systems See HPI Denies recent fever or chills. Denies sinus pressure, nasal congestion, ear pain or sore throat. Denies chest congestion, productive cough or wheezing. Denies chest pains, palpitations and leg swelling Denies abdominal pain, nausea, vomiting,diarrhea or constipation.   Denies dysuria, frequency, hesitancy or incontinence.  Denies headaches, seizures, numbness, or tingling. Denies depression, anxiety or insomnia. Denies skin break down or rash.        Objective:   Physical Exam  Patient alert and oriented and in no cardiopulmonary distress.  HEENT: No facial asymmetry, EOMI, no sinus tenderness,  oropharynx pink and moist.  Neck supple no adenopathy.  Chest: Clear to auscultation bilaterally.  CVS: S1, S2 no murmurs, no S3.  ABD: Soft non tender. Bowel sounds normal.  Ext: No edema  MS: Adequate though reduced  ROM spine, shoulders, hips and knees.Right trigger thumb  Skin: Intact, no ulcerations or rash noted.Corn on left 2nd toe  Psych: Good eye contact, normal affect. Memory intact not anxious or depressed appearing.  CNS: CN 2-12 intact, power, tone and sensation normal throughout.       Assessment & Plan:

## 2013-01-02 NOTE — Assessment & Plan Note (Signed)
Worsening with limited mobility refer to ortho

## 2013-02-19 ENCOUNTER — Other Ambulatory Visit: Payer: Self-pay | Admitting: Family Medicine

## 2013-04-14 ENCOUNTER — Telehealth: Payer: Self-pay | Admitting: Family Medicine

## 2013-04-14 NOTE — Telephone Encounter (Signed)
Patient reports a dry cough for a couple days and some hoarseness. No other symptoms. Advised Robitussin DM use as directed for 3 days and to call back if fever develops or green mucus

## 2013-05-10 ENCOUNTER — Other Ambulatory Visit: Payer: Self-pay | Admitting: Family Medicine

## 2013-05-16 ENCOUNTER — Other Ambulatory Visit: Payer: Self-pay

## 2013-05-16 ENCOUNTER — Telehealth: Payer: Self-pay | Admitting: Family Medicine

## 2013-05-16 MED ORDER — HYDROCHLOROTHIAZIDE 25 MG PO TABS: ORAL_TABLET | ORAL | Status: DC

## 2013-05-16 NOTE — Telephone Encounter (Signed)
Pharmacy changed and requested med sent.

## 2013-05-30 ENCOUNTER — Encounter: Payer: Self-pay | Admitting: Family Medicine

## 2013-05-30 ENCOUNTER — Ambulatory Visit (INDEPENDENT_AMBULATORY_CARE_PROVIDER_SITE_OTHER): Payer: Commercial Managed Care - HMO | Admitting: Family Medicine

## 2013-05-30 ENCOUNTER — Other Ambulatory Visit: Payer: Self-pay | Admitting: Family Medicine

## 2013-05-30 ENCOUNTER — Encounter (INDEPENDENT_AMBULATORY_CARE_PROVIDER_SITE_OTHER): Payer: Self-pay

## 2013-05-30 VITALS — BP 124/80 | HR 62 | Resp 16 | Ht 65.0 in | Wt 167.0 lb

## 2013-05-30 DIAGNOSIS — H547 Unspecified visual loss: Secondary | ICD-10-CM | POA: Insufficient documentation

## 2013-05-30 DIAGNOSIS — I10 Essential (primary) hypertension: Secondary | ICD-10-CM

## 2013-05-30 DIAGNOSIS — Z0000 Encounter for general adult medical examination without abnormal findings: Secondary | ICD-10-CM

## 2013-05-30 DIAGNOSIS — E785 Hyperlipidemia, unspecified: Secondary | ICD-10-CM

## 2013-05-30 LAB — LIPID PANEL
Cholesterol: 169 mg/dL (ref 0–200)
HDL: 57 mg/dL (ref >39)
LDL CALC: 99 mg/dL (ref 0–99)
Total CHOL/HDL Ratio: 3 Ratio
Triglycerides: 63 mg/dL (ref <150)
VLDL: 13 mg/dL (ref 0–40)

## 2013-05-30 LAB — COMPREHENSIVE METABOLIC PANEL
ALK PHOS: 46 U/L (ref 39–117)
ALT: 12 U/L (ref 0–35)
AST: 14 U/L (ref 0–37)
Albumin: 3.8 g/dL (ref 3.5–5.2)
BUN: 17 mg/dL (ref 6–23)
CO2: 32 mEq/L (ref 19–32)
Calcium: 9.8 mg/dL (ref 8.4–10.5)
Chloride: 103 mEq/L (ref 96–112)
Creat: 0.59 mg/dL (ref 0.50–1.10)
Glucose, Bld: 79 mg/dL (ref 70–99)
Potassium: 3.5 mEq/L (ref 3.5–5.3)
SODIUM: 140 meq/L (ref 135–145)
TOTAL PROTEIN: 6.8 g/dL (ref 6.0–8.3)
Total Bilirubin: 0.6 mg/dL (ref 0.2–1.2)

## 2013-05-30 MED ORDER — HYDROCHLOROTHIAZIDE 25 MG PO TABS: ORAL_TABLET | ORAL | Status: DC

## 2013-05-30 MED ORDER — POTASSIUM CHLORIDE ER 10 MEQ PO TBCR: EXTENDED_RELEASE_TABLET | ORAL | Status: DC

## 2013-05-30 MED ORDER — SIMVASTATIN 10 MG PO TABS: ORAL_TABLET | ORAL | Status: DC

## 2013-05-30 NOTE — Assessment & Plan Note (Signed)
Annual exam as documented. Counseling done  re healthy lifestyle involving commitment to 150 minutes exercise per week, heart healthy diet, and attaining healthy weight.The importance of adequate sleep also discussed. Regular seat belt use and safe storage  of firearms if patient has them, is also discussed. Changes in health habits are decided on by the patient with goals and time frames  set for achieving them. Immunization and cancer screening needs are specifically addressed at this visit. Fall prevention al;so discussed 

## 2013-05-30 NOTE — Progress Notes (Signed)
Subjective:    Patient ID: Ninfa Linden, female    DOB: May 05, 1932, 80 y.o.   MRN: [id number]  HPI Preventive Screening-Counseling & Management   Patient present here today for a Medicare annual wellness visit.   Current Problems (verified)   Medications Prior to Visit Allergies (verified)   PAST HISTORY  Family History: 1 brother died in his 32's due to diabetes, 3 living sisters one had a stroke  At age 66, another sister getting mild dementia at age 66, third sister is diabetic  Social History Widow x 2 years and Mom or 3 adult children   Risk Factors  Current exercise habits:  3 to 4 days per week in [employer]  [employer]  Dietary issues discussed:low salt heart healthy diet , pt to be mindful of sugar and carb content   Cardiac risk factors: no significant risk factors  Depression Screen  (Note: if answer to either of the following is "Yes", a more complete depression screening is indicated)   Over the past two weeks, have you felt down, depressed or hopeless? No  Over the past two weeks, have you felt little interest or pleasure in doing things? No  Have you lost interest or pleasure in daily life? No  Do you often feel hopeless? No  Do you cry easily over simple problems? No   Activities of Daily Living  In your present state of health, do you have any difficulty performing the following activities?  Driving?: No, however avoids night driving Managing money?: No Feeding yourself?:No Getting from bed to chair?:No Climbing a flight of stairs?:No Preparing food and eating?:No Bathing or showering?:No Getting dressed?:No Getting to the toilet?:No Using the toilet?:No Moving around from place to place?: No  Fall Risk Assessment In the past year have you fallen or had a near fall?:No Are you currently taking any medications that make you dizzy?:No   Hearing Difficulties: No Do you often ask people to speak up or repeat themselves?:No Do you  experience ringing or noises in your ears?:No Do you have difficulty understanding soft or whispered voices?:at times  Cognitive Testing  Alert? Yes Normal Appearance?Yes  Oriented to person? Yes Place? Yes  Time? Yes  Displays appropriate judgment?Yes  Can read the correct time from a watch face? yes Are you having problems remembering things?No  Advanced Directives have been discussed with the patient?Yes,full code    List the Names of Other Physician/Practitioners you currently use: None   Indicate any recent Medical Services you may have received from other than [facility] providers in the past year (date may be approximate).   Assessment:    Annual Wellness Exam   Plan:    During the course of the visit the patient was educated and counseled about appropriate screening and preventive services including:  A healthy diet is rich in fruit, vegetables and whole grains. Poultry fish, nuts and beans are a healthy choice for protein rather then red meat. A low sodium diet and drinking 64 ounces of water daily is generally recommended. Oils and sweet should be limited. Carbohydrates especially for those who are diabetic or overweight, should be limited to 30-45 gram per meal. It is important to eat on a regular schedule, at least 3 times daily. Snacks should be primarily fruits, vegetables or nuts. It is important that you exercise regularly at least 30 minutes 5 times a week. If you develop chest pain, have severe difficulty breathing, or feel very tired, stop exercising immediately and seek  medical attention  Immunization reviewed and updated. Cancer screening reviewed and updated    Patient Instructions (the written plan) was given to the patient.  Medicare Attestation  I have personally reviewed:  The patient's medical and social history  Their use of alcohol, tobacco or illicit drugs  Their current medications and supplements  The patient's functional ability including ADLs,fall  risks, home safety risks, cognitive, and hearing and visual impairment  Diet and physical activities  Evidence for depression or mood disorders  The patient's weight, height, BMI, and visual acuity have been recorded in the chart. I have made referrals, counseling, and provided education to the patient based on review of the above and I have provided the patient with a written personalized care plan for preventive services.      Review of Systems     Objective:   Physical Exam        Assessment & Plan:  Routine general medical examination at a health care facility Annual exam as documented. Counseling done  re healthy lifestyle involving commitment to 150 minutes exercise per week, heart healthy diet, and attaining healthy weight.The importance of adequate sleep also discussed. Regular seat belt use and safe storage  of firearms if patient has them, is also discussed. Changes in health habits are decided on by the patient with goals and time frames  set for achieving them. Immunization and cancer screening needs are specifically addressed at this visit. Fall prevention also discussed

## 2013-05-30 NOTE — Patient Instructions (Addendum)
F/U with rectal  in 4 .5  months, call if you need me before  It is important that you exercise regularly at least 30 minutes 5 times a week. If you develop chest pain, have severe difficulty breathing, or feel very tired, stop exercising immediately and seek medical attention    A healthy diet is rich in fruit, vegetables and whole grains. Poultry fish, nuts and beans are a healthy choice for protein rather then red meat. A low sodium diet and drinking 64 ounces of water daily is generally recommended. Oils and sweet should be limited. Carbohydrates especially for those who are diabetic or overweight, should be limited to 45 to 60 gram per meal. It is important to eat on a regular schedule, at least 3 times daily. Snacks should be primarily fruits, vegetables or nuts.  Adequate rest, generally 6 to 8 hours per night is important for good health.Good sleep hygiene involves setting a regular bedtime, and turning off all sound and light in your sleep environment.Limiting caffeine intake will also help with the ability to rest well  Lab work needs to be done lipid, cmp   All medications need to be brought to every visit  You are referred for eye exam , pls let referral staff know the Doctor that you want to see  Number to call [place] is [phone] to get evaluaed for hearing aids  Fall Prevention and Home Safety Falls cause injuries and can affect all age groups. It is possible to prevent falls.  HOW TO PREVENT FALLS  Wear shoes with rubber soles that do not have an opening for your toes.  Keep the inside and outside of your house well lit.  Use night lights throughout your home.  Remove clutter from floors.  Clean up floor spills.  Remove throw rugs or fasten them to the floor with carpet tape.  Do not place electrical cords across pathways.  Put grab bars by your tub, shower, and toilet. Do not use towel bars as grab bars.  Put handrails on both sides of the stairway. Fix loose  handrails.  Do not climb on stools or stepladders, if possible.  Do not wax your floors.  Repair uneven or unsafe sidewalks, walkways, or stairs.  Keep items you use a lot within reach.  Be aware of pets.  Keep emergency numbers next to the telephone.  Put smoke detectors in your home and near bedrooms. Ask your doctor what other things you can do to prevent falls. Document Released: 12/28/2008 Document Revised: 09/02/2011 Document Reviewed: 06/03/2011 [facility] Patient Information 2014 [place], [place].

## 2013-06-14 ENCOUNTER — Telehealth: Payer: Self-pay | Admitting: *Deleted

## 2013-06-14 NOTE — Telephone Encounter (Signed)
Pt said Dr. Lodema HongSimpson told her to call to let her know that she is going to see Dr. [place]oney [place]Petty in [place], her appointment is next Thursday at 11:00. If any questions please call [phone]

## 2013-06-14 NOTE — Telephone Encounter (Signed)
Gave message to [employer]

## 2013-08-15 ENCOUNTER — Other Ambulatory Visit: Payer: Self-pay

## 2013-08-15 ENCOUNTER — Other Ambulatory Visit: Payer: Self-pay | Admitting: Family Medicine

## 2013-08-15 MED ORDER — SIMVASTATIN 10 MG PO TABS: ORAL_TABLET | ORAL | Status: DC

## 2013-08-15 MED ORDER — POTASSIUM CHLORIDE ER 10 MEQ PO TBCR: EXTENDED_RELEASE_TABLET | ORAL | Status: DC

## 2013-10-18 ENCOUNTER — Other Ambulatory Visit: Payer: Self-pay | Admitting: Family Medicine

## 2013-10-18 DIAGNOSIS — Z139 Encounter for screening, unspecified: Secondary | ICD-10-CM

## 2013-10-20 ENCOUNTER — Encounter (INDEPENDENT_AMBULATORY_CARE_PROVIDER_SITE_OTHER): Payer: Self-pay

## 2013-10-20 ENCOUNTER — Ambulatory Visit (HOSPITAL_COMMUNITY)
Admission: RE | Admit: 2013-10-20 | Discharge: 2013-10-20 | Disposition: A | Discharge status: Home / Self Care | Payer: Medicare HMO | Source: Ambulatory Visit | Attending: Family Medicine | Admitting: Family Medicine

## 2013-10-20 ENCOUNTER — Ambulatory Visit (INDEPENDENT_AMBULATORY_CARE_PROVIDER_SITE_OTHER): Payer: Medicare HMO | Admitting: Family Medicine

## 2013-10-20 ENCOUNTER — Encounter: Payer: Self-pay | Admitting: Family Medicine

## 2013-10-20 VITALS — BP 120/72 | HR 60 | Resp 16 | Wt 170.4 lb

## 2013-10-20 DIAGNOSIS — E663 Overweight: Secondary | ICD-10-CM

## 2013-10-20 DIAGNOSIS — Z1231 Encounter for screening mammogram for malignant neoplasm of breast: Secondary | ICD-10-CM | POA: Insufficient documentation

## 2013-10-20 DIAGNOSIS — IMO0002 Reserved for concepts with insufficient information to code with codable children: Secondary | ICD-10-CM

## 2013-10-20 DIAGNOSIS — Z23 Encounter for immunization: Secondary | ICD-10-CM

## 2013-10-20 DIAGNOSIS — I10 Essential (primary) hypertension: Secondary | ICD-10-CM

## 2013-10-20 DIAGNOSIS — Z1211 Encounter for screening for malignant neoplasm of colon: Secondary | ICD-10-CM

## 2013-10-20 DIAGNOSIS — Z139 Encounter for screening, unspecified: Secondary | ICD-10-CM

## 2013-10-20 DIAGNOSIS — M1710 Unilateral primary osteoarthritis, unspecified knee: Secondary | ICD-10-CM

## 2013-10-20 DIAGNOSIS — E785 Hyperlipidemia, unspecified: Secondary | ICD-10-CM

## 2013-10-20 LAB — POC HEMOCCULT BLD/STL (OFFICE/1-CARD/DIAGNOSTIC): Fecal Occult Blood, POC: NEGATIVE

## 2013-10-20 NOTE — Patient Instructions (Addendum)
F/u in mid January, call if you need me before  Rectal exam today is normal  Prevnar today, call  for flu vaccine after next week  Pls reconsider the bone density test, I do recommend this   Fasting lipid and CMP today

## 2013-10-21 LAB — COMPREHENSIVE METABOLIC PANEL
ALK PHOS: 46 U/L (ref 39–117)
ALT: 10 U/L (ref 0–35)
AST: 14 U/L (ref 0–37)
Albumin: 4.1 g/dL (ref 3.5–5.2)
BUN: 19 mg/dL (ref 6–23)
CALCIUM: 10.2 mg/dL (ref 8.4–10.5)
CHLORIDE: 101 meq/L (ref 96–112)
CO2: 30 mEq/L (ref 19–32)
CREATININE: 0.7 mg/dL (ref 0.50–1.10)
Glucose, Bld: 87 mg/dL (ref 70–99)
Potassium: 4 mEq/L (ref 3.5–5.3)
Sodium: 140 mEq/L (ref 135–145)
Total Bilirubin: 0.6 mg/dL (ref 0.2–1.2)
Total Protein: 6.9 g/dL (ref 6.0–8.3)

## 2013-10-21 LAB — LIPID PANEL
CHOLESTEROL: 176 mg/dL (ref 0–200)
HDL: 63 mg/dL (ref >39)
LDL Cholesterol: 99 mg/dL (ref 0–99)
TRIGLYCERIDES: 69 mg/dL (ref <150)
Total CHOL/HDL Ratio: 2.8 Ratio
VLDL: 14 mg/dL (ref 0–40)

## 2013-10-22 ENCOUNTER — Encounter: Payer: Self-pay | Admitting: Family Medicine

## 2013-10-22 DIAGNOSIS — Z23 Encounter for immunization: Secondary | ICD-10-CM | POA: Insufficient documentation

## 2013-10-22 NOTE — Assessment & Plan Note (Signed)
Controlled, no change in medication DASH diet and commitment to daily physical activity for a minimum of 30 minutes discussed and encouraged, as a part of hypertension management. The importance of attaining a healthy weight is also discussed.  

## 2013-10-22 NOTE — Assessment & Plan Note (Signed)
Vaccine administered.

## 2013-10-22 NOTE — Assessment & Plan Note (Signed)
Denies uncontrolled pain or instability, management as before  With tylenol as needed

## 2013-10-22 NOTE — Progress Notes (Signed)
   Subjective:    Patient ID: Lori Stein, female    DOB: 12/22/1932, 80 y.o.   MRN: [id number]  HPI The PT is here for follow up and re-evaluation of chronic medical conditions, medication management and review of any available recent lab and radiology data.  Preventive health is updated, specifically  Cancer screening and Immunization.   Was never seen fore trigger thumb, states she will put this off now, not as interested in getting help The PT denies any adverse reactions to current medications since the last visit.  There are no new concerns.  There are no specific complaints       Review of Systems See HPI Denies recent fever or chills. Denies sinus pressure, nasal congestion, ear pain or sore throat. Denies chest congestion, productive cough or wheezing. Denies chest pains, palpitations and leg swelling Denies abdominal pain, nausea, vomiting,diarrhea or constipation.   Denies dysuria, frequency, hesitancy or incontinence. Denies uncontrolled  joint pain, swelling and limitation in mobility.Does have arthritic pain in the knees but no falls or instability Denies headaches, seizures, numbness, or tingling. Denies depression, anxiety or insomnia. Denies skin break down or rash.        Objective:   Physical Exam BP 120/72  Pulse 60  Resp 16  Wt 170 lb 6.4 oz (77.293 kg)  SpO2 97% Patient alert and oriented and in no cardiopulmonary distress.  HEENT: No facial asymmetry, EOMI,   oropharynx pink and moist.  Neck supple no JVD, no mass.  Chest: Clear to auscultation bilaterally.  CVS: S1, S2 no murmurs, no S3.Regular rate.  ABD: Soft non tender.  Rectal: good sphincter tone, no mass, heme negative stool Ext: No edema  MS: Adequate though reduced  ROM spine, shoulders, hips and knees.  Skin: Intact, no ulcerations or rash noted.  Psych: Good eye contact, normal affect. Memory intact not anxious or depressed appearing.  CNS: CN 2-12 intact, power,   normal throughout.no focal deficits noted.        Assessment & Plan:  HYPERTENSION Controlled, no change in medication DASH diet and commitment to daily physical activity for a minimum of 30 minutes discussed and encouraged, as a part of hypertension management. The importance of attaining a healthy weight is also discussed.   HYPERLIPIDEMIA Controlled, no change in medication Hyperlipidemia:Low fat diet discussed and encouraged.    OVERWEIGHT Unchnaged Patient re-educated about  the importance of commitment to a  minimum of 150 minutes of exercise per week. The importance of healthy food choices with portion control discussed. Encouraged to start a food diary, count calories and to consider  joining a support group. Sample diet sheets offered. Goals set by the patient for the next several months.     OSTEOARTHRITIS, KNEE Denies uncontrolled pain or instability, management as before  With tylenol as needed  Need for vaccination with 13-polyvalent pneumococcal conjugate vaccine Vaccine administered

## 2013-10-22 NOTE — Assessment & Plan Note (Signed)
Unchnaged. Patient re-educated about  the importance of commitment to a  minimum of 150 minutes of exercise per week. The importance of healthy food choices with portion control discussed. Encouraged to start a food diary, count calories and to consider  joining a support group. Sample diet sheets offered. Goals set by the patient for the next several months.    

## 2013-10-22 NOTE — Assessment & Plan Note (Signed)
Controlled, no change in medication Hyperlipidemia:Low fat diet discussed and encouraged.  \ 

## 2014-01-13 ENCOUNTER — Ambulatory Visit (INDEPENDENT_AMBULATORY_CARE_PROVIDER_SITE_OTHER): Payer: Medicare HMO

## 2014-01-13 ENCOUNTER — Encounter (INDEPENDENT_AMBULATORY_CARE_PROVIDER_SITE_OTHER): Payer: Self-pay

## 2014-01-13 DIAGNOSIS — Z23 Encounter for immunization: Secondary | ICD-10-CM

## 2014-02-20 ENCOUNTER — Other Ambulatory Visit: Payer: Self-pay | Admitting: Family Medicine

## 2014-03-20 ENCOUNTER — Ambulatory Visit (INDEPENDENT_AMBULATORY_CARE_PROVIDER_SITE_OTHER): Payer: Commercial Managed Care - HMO | Admitting: Family Medicine

## 2014-03-20 ENCOUNTER — Encounter (INDEPENDENT_AMBULATORY_CARE_PROVIDER_SITE_OTHER): Payer: Self-pay

## 2014-03-20 ENCOUNTER — Encounter: Payer: Self-pay | Admitting: Family Medicine

## 2014-03-20 VITALS — BP 120/78 | HR 77 | Resp 16 | Ht 65.0 in | Wt 169.0 lb

## 2014-03-20 DIAGNOSIS — M1711 Unilateral primary osteoarthritis, right knee: Secondary | ICD-10-CM

## 2014-03-20 DIAGNOSIS — E785 Hyperlipidemia, unspecified: Secondary | ICD-10-CM

## 2014-03-20 DIAGNOSIS — I10 Essential (primary) hypertension: Secondary | ICD-10-CM

## 2014-03-20 DIAGNOSIS — E559 Vitamin D deficiency, unspecified: Secondary | ICD-10-CM

## 2014-03-20 DIAGNOSIS — E663 Overweight: Secondary | ICD-10-CM

## 2014-03-20 LAB — LIPID PANEL
CHOLESTEROL: 177 mg/dL (ref 0–200)
HDL: 66 mg/dL (ref >39)
LDL Cholesterol: 97 mg/dL (ref 0–99)
Total CHOL/HDL Ratio: 2.7 Ratio
Triglycerides: 68 mg/dL (ref <150)
VLDL: 14 mg/dL (ref 0–40)

## 2014-03-20 LAB — COMPREHENSIVE METABOLIC PANEL
ALT: 12 U/L (ref 0–35)
AST: 14 U/L (ref 0–37)
Albumin: 3.9 g/dL (ref 3.5–5.2)
Alkaline Phosphatase: 53 U/L (ref 39–117)
BILIRUBIN TOTAL: 0.8 mg/dL (ref 0.2–1.2)
BUN: 17 mg/dL (ref 6–23)
CO2: 30 mEq/L (ref 19–32)
Calcium: 9.8 mg/dL (ref 8.4–10.5)
Chloride: 102 mEq/L (ref 96–112)
Creat: 0.61 mg/dL (ref 0.50–1.10)
GLUCOSE: 82 mg/dL (ref 70–99)
Potassium: 3.9 mEq/L (ref 3.5–5.3)
SODIUM: 142 meq/L (ref 135–145)
Total Protein: 6.9 g/dL (ref 6.0–8.3)

## 2014-03-20 LAB — CBC
HEMATOCRIT: 43.3 % (ref 36.0–46.0)
HEMOGLOBIN: 14.6 g/dL (ref 12.0–15.0)
MCH: 29.3 pg (ref 26.0–34.0)
MCHC: 33.7 g/dL (ref 30.0–36.0)
MCV: 86.8 fL (ref 78.0–100.0)
MPV: 10.1 fL (ref 8.6–12.4)
Platelets: 251 10*3/uL (ref 150–400)
RBC: 4.99 MIL/uL (ref 3.87–5.11)
RDW: 15.5 % (ref 11.5–15.5)
WBC: 6.6 10*3/uL (ref 4.0–10.5)

## 2014-03-20 LAB — TSH: TSH: 3.071 u[IU]/mL (ref 0.350–4.500)

## 2014-03-20 NOTE — Progress Notes (Signed)
   Subjective:    Patient ID: Lori Stein, female    DOB: 1932-05-08, 81 y.o.   MRN: [id number]  HPI The PT is here for follow up and re-evaluation of chronic medical conditions, medication management and review of any available recent lab and radiology data.  Preventive health is updated, specifically  Cancer screening and Immunization.  Continues to refuse colonoscopy, which she has never had, denies change in BM, no known f/h of colon cancer  The PT denies any adverse reactions to current medications since the last visit.  There are no new concerns.  There are no specific complaints       Review of Systems See HPI Denies recent fever or chills. Denies sinus pressure, nasal congestion, ear pain or sore throat. Denies chest congestion, productive cough or wheezing. Denies chest pains, palpitations and leg swelling Denies abdominal pain, nausea, vomiting,diarrhea or constipation.   Denies dysuria, frequency, hesitancy or incontinence. Denies uncontrolled  joint pain, swelling and limitation in mobility. Denies headaches, seizures, numbness, or tingling. Denies depression, anxiety or insomnia. Denies skin break down or rash.        Objective:   Physical Exam  BP 120/78 mmHg  Pulse 77  Resp 16  Ht  (1.651 m)  Wt 169 lb (76.658 kg)  BMI 28.12 kg/m2  SpO2 96%  Patient alert and oriented and in no cardiopulmonary distress.  HEENT: No facial asymmetry, EOMI,   oropharynx pink and moist.  Neck supple no JVD, no mass.  Chest: Clear to auscultation bilaterally.  CVS: S1, S2 no murmurs, no S3.Regular rate.  ABD: Soft non tender.   Ext: No edema  MS: Adequate though reduced ROM spine, shoulders, hips and knees.  Skin: Intact, no ulcerations or rash noted.  Psych: Good eye contact, normal affect. Memory intact not anxious or depressed appearing.  CNS: CN 2-12 intact, power,  normal throughout.no focal deficits noted.        Assessment & Plan:    Essential hypertension Controlled, no change in medication DASH diet and commitment to daily physical activity for a minimum of 30 minutes discussed and encouraged, as a part of hypertension management. The importance of attaining a healthy weight is also discussed.     Hyperlipemia Controlled, no change in medication Hyperlipidemia:Low fat diet discussed and encouraged.     Osteoarthritis of right knee Relies on tylenol for pain, denies excessive pain or instability at this time Encouraged to maintain healthy weight   Overweight (BMI 25.0-29.9) Unchanged Patient re-educated about  . The importance of healthy food choices with portion control discussed. Regular safe physical activity also encouraged

## 2014-03-20 NOTE — Patient Instructions (Signed)
Annual wellness in 4.5 month, call if you need me before  CBC, fasting lipid, cmp , TSH and vit D today  No change in medication  Fall Prevention and Home Safety Falls cause injuries and can affect all age groups. It is possible to prevent falls.  HOW TO PREVENT FALLS  Wear shoes with rubber soles that do not have an opening for your toes.  Keep the inside and outside of your house well lit.  Use night lights throughout your home.  Remove clutter from floors.  Clean up floor spills.  Remove throw rugs or fasten them to the floor with carpet tape.  Do not place electrical cords across pathways.  Put grab bars by your tub, shower, and toilet. Do not use towel bars as grab bars.  Put handrails on both sides of the stairway. Fix loose handrails.  Do not climb on stools or stepladders, if possible.  Do not wax your floors.  Repair uneven or unsafe sidewalks, walkways, or stairs.  Keep items you use a lot within reach.  Be aware of pets.  Keep emergency numbers next to the telephone.  Put smoke detectors in your home and near bedrooms. Ask your doctor what other things you can do to prevent falls. Document Released: 12/28/2008 Document Revised: 09/02/2011 Document Reviewed: 06/03/2011 [facility] Patient Information 2015 [place], [place]. This information is not intended to replace advice given to you by your health care provider. Make sure you discuss any questions you have with your health care provider.

## 2014-03-21 ENCOUNTER — Other Ambulatory Visit: Payer: Self-pay | Admitting: Family Medicine

## 2014-03-21 LAB — VITAMIN D 25 HYDROXY (VIT D DEFICIENCY, FRACTURES): VIT D 25 HYDROXY: 17 ng/mL — AB (ref 30–100)

## 2014-03-21 MED ORDER — VITAMIN D3 1.25 MG (50000 UT) PO CAPS: 50000.0000 [IU] | ORAL_CAPSULE | ORAL | Status: DC

## 2014-04-04 DIAGNOSIS — M1711 Unilateral primary osteoarthritis, right knee: Secondary | ICD-10-CM | POA: Insufficient documentation

## 2014-04-04 DIAGNOSIS — M170 Bilateral primary osteoarthritis of knee: Secondary | ICD-10-CM | POA: Insufficient documentation

## 2014-04-04 DIAGNOSIS — E663 Overweight: Secondary | ICD-10-CM | POA: Insufficient documentation

## 2014-04-04 NOTE — Assessment & Plan Note (Signed)
Relies on tylenol for pain, denies excessive pain or instability at this time Encouraged to maintain healthy weight

## 2014-04-04 NOTE — Assessment & Plan Note (Signed)
Controlled, no change in medication DASH diet and commitment to daily physical activity for a minimum of 30 minutes discussed and encouraged, as a part of hypertension management. The importance of attaining a healthy weight is also discussed.  

## 2014-04-04 NOTE — Assessment & Plan Note (Signed)
Controlled, no change in medication Hyperlipidemia:Low fat diet discussed and encouraged.  \ 

## 2014-04-04 NOTE — Assessment & Plan Note (Signed)
Unchanged Patient re-educated about  . The importance of healthy food choices with portion control discussed. Regular safe physical activity also encouraged

## 2014-05-22 ENCOUNTER — Telehealth: Payer: Self-pay

## 2014-05-22 MED ORDER — SIMVASTATIN 10 MG PO TABS: ORAL_TABLET | ORAL | Status: DC

## 2014-05-22 MED ORDER — HYDROCHLOROTHIAZIDE 25 MG PO TABS: 25.0000 mg | ORAL_TABLET | Freq: Every day | ORAL | Status: DC

## 2014-05-22 MED ORDER — POTASSIUM CHLORIDE ER 10 MEQ PO TBCR: EXTENDED_RELEASE_TABLET | ORAL | Status: DC

## 2014-05-22 MED ORDER — VITAMIN D3 1.25 MG (50000 UT) PO CAPS: 50000.0000 [IU] | ORAL_CAPSULE | ORAL | Status: DC

## 2014-05-22 NOTE — Telephone Encounter (Signed)
meds refilled to requested pharmacy  

## 2014-06-19 ENCOUNTER — Other Ambulatory Visit: Payer: Self-pay

## 2014-06-19 MED ORDER — VITAMIN D3 1.25 MG (50000 UT) PO CAPS: 50000.0000 [IU] | ORAL_CAPSULE | ORAL | Status: DC

## 2014-08-02 ENCOUNTER — Ambulatory Visit (INDEPENDENT_AMBULATORY_CARE_PROVIDER_SITE_OTHER): Payer: Commercial Managed Care - HMO | Admitting: Family Medicine

## 2014-08-02 ENCOUNTER — Encounter: Payer: Self-pay | Admitting: Family Medicine

## 2014-08-02 VITALS — BP 120/78 | HR 80 | Resp 16 | Ht 65.0 in | Wt 170.4 lb

## 2014-08-02 DIAGNOSIS — Z0000 Encounter for general adult medical examination without abnormal findings: Secondary | ICD-10-CM | POA: Diagnosis not present

## 2014-08-02 DIAGNOSIS — E559 Vitamin D deficiency, unspecified: Secondary | ICD-10-CM

## 2014-08-02 DIAGNOSIS — Z1231 Encounter for screening mammogram for malignant neoplasm of breast: Secondary | ICD-10-CM

## 2014-08-02 DIAGNOSIS — I10 Essential (primary) hypertension: Secondary | ICD-10-CM

## 2014-08-02 DIAGNOSIS — E785 Hyperlipidemia, unspecified: Secondary | ICD-10-CM

## 2014-08-02 MED ORDER — VITAMIN D3 1.25 MG (50000 UT) PO CAPS: 50000.0000 [IU] | ORAL_CAPSULE | ORAL | Status: DC

## 2014-08-02 NOTE — Assessment & Plan Note (Signed)

## 2014-08-02 NOTE — Patient Instructions (Addendum)
Annual physical exam in October, please call if you need me before  [facility]  You are referred for mammogram when due in August    You Need a bone density test, discuss with one of your children , to get help to take you   It is important that you exercise regularly at least 30 minutes75 times a week. If you develop chest pain, have severe difficulty breathing, or feel very tired, stop exercising immediately and seek medical attention   Fasting lipid and hepatic in October  [facility], we consider it a privelige to serve you.

## 2014-08-02 NOTE — Progress Notes (Signed)
Subjective:    Patient ID: Lori Stein, female    DOB: 06/08/1932, 81 y.o.   MRN: [id number]  HPI  Preventive Screening-Counseling & Management   Patient present here today for a Medicare annual wellness visit.   Current Problems (verified)   Medications Prior to Visit Allergies (verified)   PAST HISTORY  Family History (verified)   Social History Widowed, with 3 children, lives alone    Risk Factors  Current exercise habits:  Walks on treadmill and stationary bike several days a week and [facility] able   Dietary issues discussed: Heart healthy diet discussed and limiting fried fatty foods and red meat and carbs    Cardiac risk factors:  Father had MI   Depression Screen  (Note: if answer to either of the following is "Yes", a more complete depression screening is indicated)   Over the past two weeks, have you felt down, depressed or hopeless? No, tries to see the good in everything   Over the past two weeks, have you felt little interest or pleasure in doing things? No  Have you lost interest or pleasure in daily life? No  Do you often feel hopeless? No  Do you cry easily over simple problems? No   Activities of Daily Living  In your present state of health, do you have any difficulty performing the following activities?  Driving?: No Managing money?: No Feeding yourself?:No Getting from bed to chair?:No Climbing a flight of stairs?:No Preparing food and eating?:No Bathing or showering?:No Getting dressed?:No Getting to the toilet?:No Using the toilet?:No Moving around from place to place?: No  Fall Risk Assessment In the past year have you fallen or had a near fall?:No Are you currently taking any medications that make you dizzy?:No   Hearing Difficulties: No Do you often ask people to speak up or repeat themselves?:No Do you experience ringing or noises in your ears?:No Do you have difficulty understanding soft or  whispered voices?:No  Cognitive Testing  Alert? Yes Normal Appearance?Yes  Oriented to person? Yes Place? Yes  Time? Yes  Displays appropriate judgment?Yes  Can read the correct time from a watch face? yes Are you having problems remembering things?No  Advanced Directives have been discussed with the patient?Yes, states she has a living will    List the Names of Other Physician/Practitioners you currently use:  None   Indicate any recent Medical Services you may have received from other than [facility] providers in the past year (date may be approximate).   Assessment:    Annual Wellness Exam   Plan:    D  Medicare Attestation  I have personally reviewed:  The patient's medical and social history  Their use of alcohol, tobacco or illicit drugs  Their current medications and supplements  The patient's functional ability including ADLs,fall risks, home safety risks, cognitive, and hearing and visual impairment  Diet and physical activities  Evidence for depression or mood disorders  The patient's weight, height, BMI, and visual acuity have been recorded in the chart. I have made referrals, counseling, and provided education to the patient based on review of the above and I have provided the patient with a written personalized care plan for preventive services.     Review of Systems     Objective:   Physical Exam BP 120/78 mmHg  Pulse 80  Resp 16  Ht 5\' 5"  (1.651 m)  Wt 170 lb 6.4 oz (77.293 kg)  BMI 28.36  kg/m2  SpO2 97%        Assessment & Plan:  Medicare annual wellness visit, subsequent Annual exam as documented. Counseling done  re healthy lifestyle involving commitment to 150 minutes exercise per week, heart healthy diet, and attaining healthy weight.The importance of adequate sleep also discussed. Regular seat belt use and home safety, is also discussed. Changes in health habits are decided on by the patient with goals and time frames  set for achieving  them. Immunization and cancer screening needs are specifically addressed at this visit.

## 2014-08-27 ENCOUNTER — Other Ambulatory Visit: Payer: Self-pay | Admitting: Family Medicine

## 2014-11-23 ENCOUNTER — Other Ambulatory Visit: Payer: Self-pay | Admitting: Family Medicine

## 2014-12-18 ENCOUNTER — Ambulatory Visit (INDEPENDENT_AMBULATORY_CARE_PROVIDER_SITE_OTHER): Payer: Commercial Managed Care - HMO | Admitting: Family Medicine

## 2014-12-18 ENCOUNTER — Encounter: Payer: Self-pay | Admitting: Family Medicine

## 2014-12-18 ENCOUNTER — Other Ambulatory Visit: Payer: Self-pay | Admitting: Family Medicine

## 2014-12-18 VITALS — BP 126/72 | HR 60 | Resp 18 | Ht 65.0 in | Wt 173.0 lb

## 2014-12-18 DIAGNOSIS — Z1211 Encounter for screening for malignant neoplasm of colon: Secondary | ICD-10-CM | POA: Diagnosis not present

## 2014-12-18 DIAGNOSIS — Z0000 Encounter for general adult medical examination without abnormal findings: Secondary | ICD-10-CM | POA: Diagnosis not present

## 2014-12-18 DIAGNOSIS — Z1231 Encounter for screening mammogram for malignant neoplasm of breast: Secondary | ICD-10-CM

## 2014-12-18 DIAGNOSIS — Z23 Encounter for immunization: Secondary | ICD-10-CM

## 2014-12-18 LAB — HEMOCCULT GUIAC POC 1CARD (OFFICE): FECAL OCCULT BLD: NEGATIVE

## 2014-12-18 NOTE — Assessment & Plan Note (Signed)

## 2014-12-18 NOTE — Patient Instructions (Signed)
Annual wellness March 17 or after, call if you need me sooner  Flu vaccine today  Please try to get exercise 5 days per week, this will help knee stiffness  Extra strength tylenol , 500 mg , one twice daily helps arthritis , and is safer than alleve to use on a daily basis  You are referred for a mammogram.  You need a bone density test, please let nurse know if you decide on this so referral can be entered. This test will let me know if you need additional medication to help to prevent you from getting a fracture  Labs today

## 2014-12-18 NOTE — Progress Notes (Signed)
   Subjective:    Patient ID: Lori Stein, female    DOB: Aug 07, 1932, 81 y.o.   MRN: [id number]  HPI  Patient is in for annual physical exam. No other health concerns are expressed or addressed at the visit. Recent labs, if available are reviewed. Immunization is reviewed , and  updated if needed.   Review of Systems    See HPI  Objective:   Physical Exam  BP 126/72 mmHg  Pulse 60  Resp 18  Ht  (1.651 m)  Wt 173 lb (78.472 kg)  BMI 28.79 kg/m2  SpO2 97%  Pleasant well nourished female, alert and oriented x 3, in no cardio-pulmonary distress. Afebrile. HEENT No facial trauma or asymetry. Sinuses non tender.  Extra occullar muscles intact, pupils equally reactive to light. External ears normal, tympanic membranes clear. Oropharynx moist, no exudate, upper and lower dentures Neck:supple, no adenopathy,JVD or thyromegaly.No bruits.  Chest: Clear to ascultation bilaterally.No crackles or wheezes. Non tender to palpation  Breast: No asymetry,no masses or lumps. No tenderness. No nipple discharge or inversion. No axillary or supraclavicular adenopathy  Cardiovascular system; Heart sounds normal,  S1 and  S2 ,no S3.  No murmur, or thrill. Apical beat not displaced Peripheral pulses normal.  Abdomen: Soft, non tender, no organomegaly or masses. No bruits. Bowel sounds normal. No guarding, tenderness or rebound.  Rectal:  Normal sphincter tone. No mass.No rectal masses.  Guaiac negative stool.  GU: External genitalia normal female genitalia , female distribution of hair. No lesions. Urethral meatus normal in size, no  Prolapse, no lesions visibly  Present. Bladder non tender. Vagina pink and moist , with no visible lesions , discharge present . Adequate pelvic support no  cystocele or rectocele noted Cervix pink and apatrophic, no adnexal masses, no  adnexal tenderness.   Musculoskeletal exam: Full though reduced  ROM of spine, hips , shoulders  and knees. Bilateral knee deformity ,swelling or crepitus noted. No muscle wasting or atrophy.   Neurologic: Cranial nerves 2 to 12 intact. Power, tone ,sensation and reflexes normal throughout. No disturbance in gait. No tremor.  Skin: Intact, no ulceration, erythema , scaling or rash noted. Pigmentation normal throughout  Psych; Normal mood and affect. Judgement and concentration normal      Assessment & Plan:  Annual physical exam Annual exam as documented. Counseling done  re healthy lifestyle involving commitment to 150 minutes exercise per week, heart healthy diet, and attaining healthy weight.The importance of adequate sleep also discussed. Regular seat belt use and home safety, is also discussed. Changes in health habits are decided on by the patient with goals and time frames  set for achieving them. Immunization and cancer screening needs are specifically addressed at this visit.

## 2014-12-19 LAB — BASIC METABOLIC PANEL
BUN: 18 mg/dL (ref 7–25)
CO2: 30 mmol/L (ref 20–31)
CREATININE: 0.65 mg/dL (ref 0.60–0.88)
Calcium: 10.2 mg/dL (ref 8.6–10.4)
Chloride: 102 mmol/L (ref 98–110)
GLUCOSE: 83 mg/dL (ref 65–99)
Potassium: 3.8 mmol/L (ref 3.5–5.3)
Sodium: 140 mmol/L (ref 135–146)

## 2014-12-19 LAB — LIPID PANEL
Cholesterol: 184 mg/dL (ref 125–200)
HDL: 69 mg/dL (ref >=46)
LDL Cholesterol: 101 mg/dL (ref <130)
Total CHOL/HDL Ratio: 2.7 Ratio (ref <=5.0)
Triglycerides: 70 mg/dL (ref <150)
VLDL: 14 mg/dL (ref <30)

## 2014-12-19 LAB — HEPATIC FUNCTION PANEL
ALT: 12 U/L (ref 6–29)
AST: 15 U/L (ref 10–35)
Albumin: 3.9 g/dL (ref 3.6–5.1)
Alkaline Phosphatase: 44 U/L (ref 33–130)
BILIRUBIN DIRECT: 0.2 mg/dL (ref <=0.2)
Indirect Bilirubin: 0.5 mg/dL (ref 0.2–1.2)
TOTAL PROTEIN: 7 g/dL (ref 6.1–8.1)
Total Bilirubin: 0.7 mg/dL (ref 0.2–1.2)

## 2015-01-05 ENCOUNTER — Other Ambulatory Visit: Payer: Self-pay | Admitting: Family Medicine

## 2015-01-22 ENCOUNTER — Other Ambulatory Visit: Payer: Self-pay | Admitting: Family Medicine

## 2015-01-22 MED ORDER — ACETAMINOPHEN ER 650 MG PO TBCR: 650.0000 mg | EXTENDED_RELEASE_TABLET | Freq: Two times a day (BID) | ORAL | Status: DC

## 2015-01-22 NOTE — Telephone Encounter (Signed)
Med refilled.

## 2015-01-22 NOTE — Telephone Encounter (Signed)
Patient is calling requesting a refill on medication acetaminophen (ARTHRITIS PAIN RELIEF) 650 MG CR tablet  For her arthritis pain, please advise?

## 2015-02-21 ENCOUNTER — Other Ambulatory Visit: Payer: Self-pay | Admitting: Family Medicine

## 2015-02-28 ENCOUNTER — Other Ambulatory Visit: Payer: Self-pay | Admitting: Family Medicine

## 2015-03-13 ENCOUNTER — Telehealth: Payer: Self-pay | Admitting: Family Medicine

## 2015-03-13 MED ORDER — BENZONATATE 100 MG PO CAPS: 100.0000 mg | ORAL_CAPSULE | Freq: Two times a day (BID) | ORAL | Status: DC | PRN

## 2015-03-13 MED ORDER — PREDNISONE 5 MG PO TABS: 5.0000 mg | ORAL_TABLET | Freq: Two times a day (BID) | ORAL | Status: AC

## 2015-03-13 MED ORDER — PROMETHAZINE-DM 6.25-15 MG/5ML PO SYRP: ORAL_SOLUTION | ORAL | Status: DC

## 2015-03-13 NOTE — Telephone Encounter (Signed)
Patient c/o sinus drainage, cough, slight headache x 3 days.  Please advise.

## 2015-03-13 NOTE — Telephone Encounter (Signed)
Patient aware.

## 2015-03-13 NOTE — Telephone Encounter (Signed)
3 meds sent, prednisone, tessalon perles and phnergan DM, also advise sudafed one daily for next 3 to 5 days

## 2015-03-13 NOTE — Telephone Encounter (Signed)
Patient is coughing, bronchitis, denies fever nose is running/ symptoms since Sunday, [facility], please advise?

## 2015-05-22 ENCOUNTER — Other Ambulatory Visit: Payer: Self-pay | Admitting: Family Medicine

## 2015-05-23 ENCOUNTER — Other Ambulatory Visit: Payer: Self-pay | Admitting: Family Medicine

## 2015-06-18 ENCOUNTER — Encounter: Payer: Commercial Managed Care - HMO | Admitting: Family Medicine

## 2015-08-22 ENCOUNTER — Other Ambulatory Visit: Payer: Self-pay | Admitting: Family Medicine

## 2015-09-13 ENCOUNTER — Other Ambulatory Visit: Payer: Self-pay

## 2015-09-13 MED ORDER — HYDROCHLOROTHIAZIDE 25 MG PO TABS: 25.0000 mg | ORAL_TABLET | Freq: Every day | ORAL | Status: DC

## 2015-10-17 ENCOUNTER — Encounter: Payer: Commercial Managed Care - HMO | Admitting: Family Medicine

## 2015-12-10 ENCOUNTER — Other Ambulatory Visit: Payer: Self-pay | Admitting: Family Medicine

## 2015-12-17 ENCOUNTER — Ambulatory Visit (INDEPENDENT_AMBULATORY_CARE_PROVIDER_SITE_OTHER): Payer: Commercial Managed Care - HMO

## 2015-12-17 VITALS — BP 116/68 | HR 60 | Resp 18 | Ht 65.0 in | Wt 164.0 lb

## 2015-12-17 DIAGNOSIS — Z0000 Encounter for general adult medical examination without abnormal findings: Secondary | ICD-10-CM | POA: Diagnosis not present

## 2015-12-17 MED ORDER — SIMVASTATIN 10 MG PO TABS: ORAL_TABLET | ORAL | 1 refills | Status: DC

## 2015-12-17 MED ORDER — HYDROCHLOROTHIAZIDE 25 MG PO TABS: 25.0000 mg | ORAL_TABLET | Freq: Every day | ORAL | 1 refills | Status: DC

## 2015-12-17 MED ORDER — POTASSIUM CHLORIDE CRYS ER 10 MEQ PO TBCR: 10.0000 meq | EXTENDED_RELEASE_TABLET | Freq: Two times a day (BID) | ORAL | 1 refills | Status: DC

## 2015-12-17 NOTE — Progress Notes (Signed)
Subjective:    Lori Stein is a 82 y.o. female who presents for Medicare Annual/Subsequent preventive examination.  Preventive Screening-Counseling & Management  Tobacco History  Smoking Status  . Never Smoker  Smokeless Tobacco  . Never Used     Current Problems (verified) Patient Active Problem List   Diagnosis Date Noted  . Annual physical exam 12/18/2014  . Vitamin D deficiency 08/02/2014  . Osteoarthritis of right knee 04/04/2014  . Overweight (BMI 25.0-29.9) 04/04/2014  . Corn of toe 12/27/2012  . Trigger thumb of right hand 12/27/2012  . Hyperlipemia 06/17/2007  . Essential hypertension 06/17/2007    Medications Prior to Visit Current Outpatient Prescriptions on File Prior to Visit  Medication Sig Dispense Refill  . acetaminophen (ARTHRITIS PAIN RELIEF) 650 MG CR tablet Take 1 tablet (650 mg total) by mouth 2 (two) times daily. 60 tablet 3  . aspirin (ASPIRIN LOW DOSE) 81 MG EC tablet Take 81 mg by mouth daily.      . calcium-vitamin D (OSCAL 500/200 D-3) 500-200 MG-UNIT per tablet Take 1 tablet by mouth 3 (three) times daily.      . Cholecalciferol (VITAMIN D3) 50000 UNITS CAPS Take 50,000 Units by mouth once a week. 12 capsule 3  . D3-50 50000 units capsule TAKE ONE CAPSULE BY MOUTH WEEKLY 12 capsule 1  . Glucosamine-Chondroitin (OSTEO BI-FLEX REGULAR STRENGTH) 250-200 MG TABS Take by mouth daily.      . Multiple Vitamin (MULTIVITAMIN) tablet Take 1 tablet by mouth daily.      . Omega-3 Fatty Acids (FISH OIL) 1000 MG CAPS Take by mouth. Take 3 caps by mouth daily        No current facility-administered medications on file prior to visit.     Current Medications (verified) Current Outpatient Prescriptions  Medication Sig Dispense Refill  . acetaminophen (ARTHRITIS PAIN RELIEF) 650 MG CR tablet Take 1 tablet (650 mg total) by mouth 2 (two) times daily. 60 tablet 3  . aspirin (ASPIRIN LOW DOSE) 81 MG EC tablet Take 81 mg by mouth daily.      .  calcium-vitamin D (OSCAL 500/200 D-3) 500-200 MG-UNIT per tablet Take 1 tablet by mouth 3 (three) times daily.      . Cholecalciferol (VITAMIN D3) 50000 UNITS CAPS Take 50,000 Units by mouth once a week. 12 capsule 3  . D3-50 50000 units capsule TAKE ONE CAPSULE BY MOUTH WEEKLY 12 capsule 1  . Glucosamine-Chondroitin (OSTEO BI-FLEX REGULAR STRENGTH) 250-200 MG TABS Take by mouth daily.      . hydrochlorothiazide (HYDRODIURIL) 25 MG tablet Take 1 tablet (25 mg total) by mouth daily. 90 tablet 1  . Multiple Vitamin (MULTIVITAMIN) tablet Take 1 tablet by mouth daily.      . Omega-3 Fatty Acids (FISH OIL) 1000 MG CAPS Take by mouth. Take 3 caps by mouth daily       . potassium chloride (KLOR-CON M10) 10 MEQ tablet Take 1 tablet (10 mEq total) by mouth 2 (two) times daily. 180 tablet 1  . simvastatin (ZOCOR) 10 MG tablet TAKE 1 TABLET BY MOUTH AT BEDTIME FOR CHOLESTEROL 90 tablet 1   No current facility-administered medications for this visit.      Allergies (verified) Penicillins and Sulfonamide derivatives   PAST HISTORY  Family History Family History  Problem Relation Age of Onset  . Aneurysm Mother     AAA  . Heart attack Father   . Hypertension Father   . Diabetes Sister   . Diabetes Brother   .  Hypertension Sister     Social History Social History  Substance Use Topics  . Smoking status: Never Smoker  . Smokeless tobacco: Never Used  . Alcohol use No     Are there smokers in your home (other than you)? No  Risk Factors Current exercise habits: The patient does not participate in regular exercise at present.  Regularly does yard work  Dietary issues discussed: Patient recently participated in a offered healthy cooking classed offered at the hospital.  She is very interested in learning healthy recipes.   Cardiac risk factors: advanced age (older than 56 for men, 66 for women), dyslipidemia and hypertension.  Depression Screen (Note: if answer to either of the following  is "Yes", a more complete depression screening is indicated)   Over the past two weeks, have you felt down, depressed or hopeless? No  Over the past two weeks, have you felt little interest or pleasure in doing things? No  Have you lost interest or pleasure in daily life? No  Do you often feel hopeless? No  Do you cry easily over simple problems? No  Activities of Daily Living In your present state of health, do you have any difficulty performing the following activities?:  Driving? No Managing money?  No Feeding yourself? No Getting from bed to chair? NoNo exam performed today, annual wellness without physical exam. Climbing a flight of stairs? No Preparing food and eating?: No Bathing or showering? No Getting dressed: No Getting to the toilet? No Using the toilet:No Moving around from place to place: No In the past year have you fallen or had a near fall?:No   Are you sexually active?  No  Do you have more than one partner?  N/a   Hearing Difficulties: No Do you often ask people to speak up or repeat themselves? No Do you experience ringing or noises in your ears? No Do you have difficulty understanding soft or whispered voices? No   Do you feel that you have a problem with memory? No  Do you often misplace items? No  Do you feel safe at home?  Yes  Cognitive Testing  Alert? Yes  Normal Appearance?Yes  Oriented to person? Yes  Place? Yes   Time? Yes  Recall of three objects?  Yes  Can perform simple calculations? Yes  Displays appropriate judgment?Yes  Can read the correct time from a watch face?Yes   Advanced Directives have been discussed with the patient? Yes  List the Names of Other Physician/Practitioners you currently use: 1.    Indicate any recent Medical Services you may have received from other than [facility] providers in the past year (date may be approximate).  Immunization History  Administered Date(s) Administered  . Influenza Split 11/20/2011  .  Influenza Whole 05/12/2007, 01/03/2009, 02/19/2010, 12/11/2010  . Influenza,inj,Quad PF,36+ Mos 12/27/2012, 01/13/2014, 12/18/2014  . Pneumococcal Conjugate-13 10/20/2013  . Pneumococcal Polysaccharide-23 11/23/2003  . Td 11/23/2003    Screening Tests Health Maintenance  Topic Date Due  . ZOSTAVAX  10/22/1994  . TETANUS/TDAP  11/22/2013  . DEXA SCAN  01/29/2016 (Originally 10/22/1999)  . INFLUENZA VACCINE  02/12/2016 (Originally 10/16/2015)  . PNA vac Low Risk Adult  Completed    All answers were reviewed with the patient and necessary referrals were made:  Durwin Nora, LPN   16/03/958   History reviewed: allergies, current medications, past family history, past medical history, past social history, past surgical history and problem list  Review of Systems A comprehensive review of  systems was negative.    Objective:     Vision by Snellen chart: right eye:20/30, left eye:20/30  Body mass index is 27.29 kg/m. BP 116/68   Pulse 60   Resp 18   Ht 5\' 5"  (1.651 m)   Wt 164 lb (74.4 kg)   SpO2 97%   BMI 27.29 kg/m   No exam performed today, annual wellness without physical exam.     Assessment:     Plan:     During the course of the visit the patient was educated and counseled about appropriate screening and preventive services including:    Influenza vaccine  Nutrition counseling   Diet review for nutrition referral? Yes ____  Not Indicated _x___   Patient Instructions (the written plan) was given to the patient.  Medicare Attestation I have personally reviewed: The patient's medical and social history Their use of alcohol, tobacco or illicit drugs Their current medications and supplements The patient's functional ability including ADLs,fall risks, home safety risks, cognitive, and hearing and visual impairment Diet and physical activities Evidence for depression or mood disorders  The patient's weight, height, BMI, and visual acuity have  been recorded in the chart.  I have made referrals, counseling, and provided education to the patient based on review of the above and I have provided the patient with a written personalized care plan for preventive services.     Kandis Fantasia Cricket, [place]   16/03/958

## 2015-12-17 NOTE — Patient Instructions (Signed)
Thank you for choosing [facility] for your health care needs  The Annual Wellness Visit is designed to allow us the chance to assist you in preserving and improving you health.   Dr. Lodema HongSimpson will see you back in January  Your lab order will be mailed to you with the date to have it drawn on it  Please return to have your flu shot or go to your local pharmacy  If you have any concerns please don't hesitate to call.  The new # is [phone]

## 2016-03-25 ENCOUNTER — Other Ambulatory Visit: Payer: Self-pay | Admitting: Family Medicine

## 2016-03-26 ENCOUNTER — Ambulatory Visit (INDEPENDENT_AMBULATORY_CARE_PROVIDER_SITE_OTHER): Payer: Medicare Other | Admitting: Family Medicine

## 2016-03-26 ENCOUNTER — Other Ambulatory Visit: Payer: Self-pay

## 2016-03-26 ENCOUNTER — Encounter: Payer: Self-pay | Admitting: Family Medicine

## 2016-03-26 VITALS — BP 118/78 | HR 61 | Resp 16 | Ht 65.0 in | Wt 172.0 lb

## 2016-03-26 DIAGNOSIS — Z23 Encounter for immunization: Secondary | ICD-10-CM | POA: Insufficient documentation

## 2016-03-26 DIAGNOSIS — I10 Essential (primary) hypertension: Secondary | ICD-10-CM | POA: Diagnosis not present

## 2016-03-26 DIAGNOSIS — E559 Vitamin D deficiency, unspecified: Secondary | ICD-10-CM

## 2016-03-26 DIAGNOSIS — E663 Overweight: Secondary | ICD-10-CM

## 2016-03-26 DIAGNOSIS — E782 Mixed hyperlipidemia: Secondary | ICD-10-CM | POA: Diagnosis not present

## 2016-03-26 DIAGNOSIS — M949 Disorder of cartilage, unspecified: Secondary | ICD-10-CM

## 2016-03-26 DIAGNOSIS — Z1239 Encounter for other screening for malignant neoplasm of breast: Secondary | ICD-10-CM

## 2016-03-26 DIAGNOSIS — Z1211 Encounter for screening for malignant neoplasm of colon: Secondary | ICD-10-CM | POA: Diagnosis not present

## 2016-03-26 DIAGNOSIS — Z1231 Encounter for screening mammogram for malignant neoplasm of breast: Secondary | ICD-10-CM

## 2016-03-26 DIAGNOSIS — M899 Disorder of bone, unspecified: Secondary | ICD-10-CM

## 2016-03-26 DIAGNOSIS — Z780 Asymptomatic menopausal state: Secondary | ICD-10-CM

## 2016-03-26 LAB — COMPREHENSIVE METABOLIC PANEL
ALBUMIN: 3.8 g/dL (ref 3.6–5.1)
ALT: 11 U/L (ref 6–29)
AST: 14 U/L (ref 10–35)
Alkaline Phosphatase: 44 U/L (ref 33–130)
BILIRUBIN TOTAL: 0.9 mg/dL (ref 0.2–1.2)
BUN: 23 mg/dL (ref 7–25)
CO2: 30 mmol/L (ref 20–31)
CREATININE: 0.61 mg/dL (ref 0.60–0.88)
Calcium: 10.3 mg/dL (ref 8.6–10.4)
Chloride: 105 mmol/L (ref 98–110)
Glucose, Bld: 86 mg/dL (ref 65–99)
Potassium: 3.8 mmol/L (ref 3.5–5.3)
SODIUM: 143 mmol/L (ref 135–146)
TOTAL PROTEIN: 6.9 g/dL (ref 6.1–8.1)

## 2016-03-26 LAB — CBC
HCT: 41.1 % (ref 35.0–45.0)
Hemoglobin: 13.8 g/dL (ref 11.7–15.5)
MCH: 29.3 pg (ref 27.0–33.0)
MCHC: 33.6 g/dL (ref 32.0–36.0)
MCV: 87.3 fL (ref 80.0–100.0)
MPV: 10.4 fL (ref 7.5–12.5)
PLATELETS: 255 10*3/uL (ref 140–400)
RBC: 4.71 MIL/uL (ref 3.80–5.10)
RDW: 14.9 % (ref 11.0–15.0)
WBC: 7.8 10*3/uL (ref 3.8–10.8)

## 2016-03-26 LAB — LIPID PANEL
CHOLESTEROL: 199 mg/dL (ref <200)
HDL: 72 mg/dL (ref >50)
LDL CALC: 116 mg/dL — AB (ref <100)
TRIGLYCERIDES: 54 mg/dL (ref <150)
Total CHOL/HDL Ratio: 2.8 Ratio (ref <5.0)
VLDL: 11 mg/dL (ref <30)

## 2016-03-26 LAB — POC HEMOCCULT BLD/STL (OFFICE/1-CARD/DIAGNOSTIC): FECAL OCCULT BLD: NEGATIVE

## 2016-03-26 MED ORDER — SIMVASTATIN 10 MG PO TABS: ORAL_TABLET | ORAL | 1 refills | Status: DC

## 2016-03-26 MED ORDER — POTASSIUM CHLORIDE CRYS ER 10 MEQ PO TBCR: 10.0000 meq | EXTENDED_RELEASE_TABLET | Freq: Two times a day (BID) | ORAL | 1 refills | Status: DC

## 2016-03-26 MED ORDER — HYDROCHLOROTHIAZIDE 25 MG PO TABS: 25.0000 mg | ORAL_TABLET | Freq: Every day | ORAL | 1 refills | Status: DC

## 2016-03-26 MED ORDER — VITAMIN D3 1.25 MG (50000 UT) PO CAPS: 50000.0000 [IU] | ORAL_CAPSULE | ORAL | 0 refills | Status: DC

## 2016-03-26 NOTE — Assessment & Plan Note (Signed)
Hyperlipidemia:Low fat diet discussed and encouraged.   Lipid Panel  Lab Results  Component Value Date   CHOL 184 12/18/2014   HDL 69 12/18/2014   LDLCALC 101 12/18/2014   TRIG 70 12/18/2014   CHOLHDL 2.7 12/18/2014     Updated lab needed at/ before next visit.

## 2016-03-26 NOTE — Assessment & Plan Note (Signed)
Updated lab needed at/ before next visit.   

## 2016-03-26 NOTE — Assessment & Plan Note (Signed)
After obtaining informed consent, the vaccine is  administered by LPN.  

## 2016-03-26 NOTE — Patient Instructions (Signed)
Wellness Oct 3 or after, call if you need me before  Flu vaccine today  Rectal exam today  Fasting cbc, lipid, cmp , TSH and vit D today  You are being referred for mammogram and bone density in April per your request , we will try to get them on the same day

## 2016-03-26 NOTE — Assessment & Plan Note (Signed)
Deteriorated. Patient re-educated about  the importance of commitment to a  minimum of 150 minutes of exercise per week.  The importance of healthy food choices with portion control discussed. Encouraged to start a food diary, count calories and to consider  joining a support group. Sample diet sheets offered. Goals set by the patient for the next several months.   Weight /BMI 03/26/2016 12/17/2015 12/18/2014  WEIGHT 172 lb 164 lb 173 lb  HEIGHT 5\' 5"  5\' 5"  5\' 5"   BMI 28.62 kg/m2 27.29 kg/m2 28.79 kg/m2

## 2016-03-26 NOTE — Addendum Note (Signed)
Addended by: Abner GreenspanHUDY, Axten Pascucci H on: 03/26/2016 12:59 PM   Modules accepted: Orders

## 2016-03-26 NOTE — Assessment & Plan Note (Signed)
No rectal mass and heme negative stool 

## 2016-03-26 NOTE — Assessment & Plan Note (Signed)
Controlled, no change in medication DASH diet and commitment to daily physical activity for a minimum of 30 minutes discussed and encouraged, as a part of hypertension management. The importance of attaining a healthy weight is also discussed.  BP/Weight 03/26/2016 12/17/2015 12/18/2014 08/02/2014 03/20/2014 10/20/2013 05/30/2013  Systolic BP 118 116 126 120 120 120 124  Diastolic BP 78 68 72 78 78 72 80  Wt. (Lbs) 172 164 173 170.4 169 170.4 167  BMI 28.62 27.29 28.79 28.36 28.12 28.36 27.79

## 2016-03-26 NOTE — Progress Notes (Signed)
Lori Stein     MRN: [id number]      DOB: [birth year]   HPI Lori Stein is here for follow up and re-evaluation of chronic medical conditions, medication management and review of any available recent lab and radiology data.  Preventive health is updated, specifically  Cancer screening and Immunization.   The PT denies any adverse reactions to current medications since the last visit.  There are no new concerns.  There are no specific complaints   ROS Denies recent fever or chills. Denies sinus pressure, nasal congestion, ear pain or sore throat. Denies chest congestion, productive cough or wheezing. Denies chest pains, palpitations and leg swelling Denies abdominal pain, nausea, vomiting,diarrhea or constipation.   Denies dysuria, frequency, hesitancy or incontinence. Denies uncontrolled  joint pain, swelling and limitation in mobility. Denies headaches, seizures, numbness, or tingling. Denies depression, anxiety or insomnia. Denies skin break down or rash.   PE  BP 118/78   Pulse 61   Resp 16   Ht 5\' 5"  (1.651 m)   Wt 172 lb (78 kg)   SpO2 93%   BMI 28.62 kg/m   Patient alert and oriented and in no cardiopulmonary distress.  HEENT: No facial asymmetry, EOMI,   oropharynx pink and moist.  Neck supple no JVD, no mass.  Chest: Clear to auscultation bilaterally.  CVS: S1, S2 no murmurs, no S3.Regular rate.  ABD: Soft non tender. No organomegaly or mass, normal bS Rectal: no mass, heme negative stool  Ext: No edema  MS: Adequate ROM spine, shoulders, hips and knees.  Skin: Intact, no ulcerations or rash noted.  Psych: Good eye contact, normal affect. Memory intact not anxious or depressed appearing.  CNS: CN 2-12 intact, power,  normal throughout.no focal deficits noted.   Assessment & Plan  Essential hypertension Controlled, no change in medication DASH diet and commitment to daily physical activity for a minimum of 30 minutes discussed and  encouraged, as a part of hypertension management. The importance of attaining a healthy weight is also discussed.  BP/Weight 03/26/2016 12/17/2015 12/18/2014 08/02/2014 03/20/2014 10/20/2013 05/30/2013  Systolic BP 118 116 126 120 120 120 124  Diastolic BP 78 68 72 78 78 72 80  Wt. (Lbs) 172 164 173 170.4 169 170.4 167  BMI 28.62 27.29 28.79 28.36 28.12 28.36 27.79       Hyperlipemia Hyperlipidemia:Low fat diet discussed and encouraged.   Lipid Panel  Lab Results  Component Value Date   CHOL 184 12/18/2014   HDL 69 12/18/2014   LDLCALC 101 12/18/2014   TRIG 70 12/18/2014   CHOLHDL 2.7 12/18/2014     Updated lab needed at/ before next visit.   Need for prophylactic vaccination and inoculation against influenza After obtaining informed consent, the vaccine is  administered by LPN.   Vitamin D deficiency Updated lab needed at/ before next visit.;   Overweight (BMI 25.0-29.9) Deteriorated. Patient re-educated about  the importance of commitment to a  minimum of 150 minutes of exercise per week.  The importance of healthy food choices with portion control discussed. Encouraged to start a food diary, count calories and to consider  joining a support group. Sample diet sheets offered. Goals set by the patient for the next several months.   Weight /BMI 03/26/2016 12/17/2015 12/18/2014  WEIGHT 172 lb 164 lb 173 lb  HEIGHT 5\' 5"  5\' 5"  5\' 5"   BMI 28.62 kg/m2 27.29 kg/m2 28.79 kg/m2      Colon cancer screening No rectal mass and  heme negative stool

## 2016-03-27 LAB — TSH: TSH: 2.12 mIU/L

## 2016-03-27 LAB — VITAMIN D 25 HYDROXY (VIT D DEFICIENCY, FRACTURES): Vit D, 25-Hydroxy: 55 ng/mL (ref 30–100)

## 2016-03-28 ENCOUNTER — Encounter (HOSPITAL_COMMUNITY): Payer: Self-pay | Admitting: Emergency Medicine

## 2016-03-28 ENCOUNTER — Emergency Department (HOSPITAL_COMMUNITY): Payer: Medicare Other

## 2016-03-28 ENCOUNTER — Emergency Department (HOSPITAL_COMMUNITY)
Admission: EM | Admit: 2016-03-28 | Discharge: 2016-03-28 | Disposition: A | Discharge status: Home / Self Care | Payer: Medicare Other | Attending: Emergency Medicine | Admitting: Emergency Medicine

## 2016-03-28 DIAGNOSIS — J101 Influenza due to other identified influenza virus with other respiratory manifestations: Secondary | ICD-10-CM

## 2016-03-28 DIAGNOSIS — R509 Fever, unspecified: Secondary | ICD-10-CM | POA: Diagnosis present

## 2016-03-28 DIAGNOSIS — Z79899 Other long term (current) drug therapy: Secondary | ICD-10-CM | POA: Diagnosis not present

## 2016-03-28 DIAGNOSIS — I10 Essential (primary) hypertension: Secondary | ICD-10-CM | POA: Insufficient documentation

## 2016-03-28 DIAGNOSIS — J09X2 Influenza due to identified novel influenza A virus with other respiratory manifestations: Secondary | ICD-10-CM | POA: Diagnosis not present

## 2016-03-28 DIAGNOSIS — R05 Cough: Secondary | ICD-10-CM | POA: Diagnosis not present

## 2016-03-28 LAB — COMPREHENSIVE METABOLIC PANEL
ALT: 18 U/L (ref 14–54)
ANION GAP: 8 (ref 5–15)
AST: 23 U/L (ref 15–41)
Albumin: 3.6 g/dL (ref 3.5–5.0)
Alkaline Phosphatase: 39 U/L (ref 38–126)
BUN: 17 mg/dL (ref 6–20)
CALCIUM: 9.4 mg/dL (ref 8.9–10.3)
CHLORIDE: 99 mmol/L — AB (ref 101–111)
CO2: 26 mmol/L (ref 22–32)
CREATININE: 0.68 mg/dL (ref 0.44–1.00)
Glucose, Bld: 86 mg/dL (ref 65–99)
Potassium: 3.4 mmol/L — ABNORMAL LOW (ref 3.5–5.1)
SODIUM: 133 mmol/L — AB (ref 135–145)
Total Bilirubin: 0.6 mg/dL (ref 0.3–1.2)
Total Protein: 7.3 g/dL (ref 6.5–8.1)

## 2016-03-28 LAB — URINALYSIS, ROUTINE W REFLEX MICROSCOPIC
Bacteria, UA: NONE SEEN
Bilirubin Urine: NEGATIVE
GLUCOSE, UA: NEGATIVE mg/dL
HGB URINE DIPSTICK: NEGATIVE
Ketones, ur: 20 mg/dL — AB
Nitrite: NEGATIVE
PH: 5 (ref 5.0–8.0)
PROTEIN: NEGATIVE mg/dL
SPECIFIC GRAVITY, URINE: 1.023 (ref 1.005–1.030)

## 2016-03-28 LAB — CBC WITH DIFFERENTIAL/PLATELET
Basophils Absolute: 0 10*3/uL (ref 0.0–0.1)
Basophils Relative: 0 %
EOS PCT: 0 %
Eosinophils Absolute: 0 10*3/uL (ref 0.0–0.7)
HCT: 38.3 % (ref 36.0–46.0)
Hemoglobin: 13.9 g/dL (ref 12.0–15.0)
LYMPHS ABS: 0.8 10*3/uL (ref 0.7–4.0)
LYMPHS PCT: 13 %
MCH: 29.3 pg (ref 26.0–34.0)
MCHC: 36.3 g/dL — AB (ref 30.0–36.0)
MCV: 80.6 fL (ref 78.0–100.0)
MONO ABS: 0.7 10*3/uL (ref 0.1–1.0)
MONOS PCT: 12 %
Neutro Abs: 4.6 10*3/uL (ref 1.7–7.7)
Neutrophils Relative %: 75 %
PLATELETS: 197 10*3/uL (ref 150–400)
RBC: 4.75 MIL/uL (ref 3.87–5.11)
RDW: 14.4 % (ref 11.5–15.5)
WBC: 6.2 10*3/uL (ref 4.0–10.5)

## 2016-03-28 LAB — INFLUENZA PANEL BY PCR (TYPE A & B)
INFLBPCR: NEGATIVE
Influenza A By PCR: POSITIVE — AB

## 2016-03-28 LAB — RAPID STREP SCREEN (MED CTR MEBANE ONLY): STREPTOCOCCUS, GROUP A SCREEN (DIRECT): NEGATIVE

## 2016-03-28 LAB — I-STAT CG4 LACTIC ACID, ED: Lactic Acid, Venous: 1.03 mmol/L (ref 0.5–1.9)

## 2016-03-28 MED ORDER — ACETAMINOPHEN 325 MG PO TABS
650.0000 mg | ORAL_TABLET | Freq: Once | ORAL | Status: AC | PRN
  Administered 2016-03-28: 650 mg via ORAL
  Filled 2016-03-28: qty 2

## 2016-03-28 MED ORDER — OSELTAMIVIR PHOSPHATE 75 MG PO CAPS: 75.0000 mg | ORAL_CAPSULE | Freq: Two times a day (BID) | ORAL | 0 refills | Status: DC

## 2016-03-28 NOTE — Discharge Instructions (Signed)
Take over the counter tylenol and ibuprofen, as directed on packaging, as needed for discomfort. Take the prescription as directed. Call your regular medical doctor on Monday to schedule a follow up appointment in the next 2 to 3 days.  Return to the Emergency Department immediately if worsening.

## 2016-03-28 NOTE — ED Triage Notes (Signed)
Seen by PCP weds, on thurs fever N

## 2016-03-28 NOTE — ED Provider Notes (Signed)
[facility]-EMERGENCY DEPT Provider Note   CSN: [id number] Arrival date & time: 03/28/16  1249     History   Chief Complaint Chief Complaint  Patient presents with  . Influenza  . Fever    HPI Lori Stein is a 83 y.o. female.  HPI  Pt was seen at 1455.  Per pt, c/o gradual onset and persistence of constant sore throat, runny/stuffy nose, sinus congestion, and cough for the past 2 days. Has been associated with subjective home fevers/chills. Denies rash, no CP/SOB, no N/V/D, no abd pain.    Past Medical History:  Diagnosis Date  . Back pain   . Hyperlipidemia   . Hypertension   . OA (osteoarthritis) of knee     Patient Active Problem List   Diagnosis Date Noted  . Need for prophylactic vaccination and inoculation against influenza 03/26/2016  . Colon cancer screening 03/26/2016  . Vitamin D deficiency 08/02/2014  . Overweight (BMI 25.0-29.9) 04/04/2014  . Corn of toe 12/27/2012  . Hyperlipemia 06/17/2007  . Essential hypertension 06/17/2007    Past Surgical History:  Procedure Laterality Date  . Bilateral tubal ligation    . CHOLECYSTECTOMY  2011  . JOINT REPLACEMENT  09/2010   left knee  . removal of mole from left neck  2004    OB History    No data available       Home Medications    Prior to Admission medications   Medication Sig Start Date End Date Taking? Authorizing Provider  acetaminophen (TYLENOL) 325 MG tablet Take 650 mg by mouth every 6 (six) hours as needed for moderate pain.   Yes Historical Provider, MD  aspirin (ASPIRIN LOW DOSE) 81 MG EC tablet Take 81 mg by mouth daily.     Yes Historical Provider, MD  Cholecalciferol (VITAMIN D3) 50000 units CAPS Take 50,000 Units by mouth once a week. 03/26/16  Yes [name], MD  hydrochlorothiazide (HYDRODIURIL) 25 MG tablet Take 1 tablet (25 mg total) by mouth daily. 03/26/16  Yes [name], MD  Multiple Vitamin (MULTIVITAMIN) tablet Take 1 tablet by mouth daily.     Yes Historical  Provider, MD  Omega-3 Fatty Acids (FISH OIL) 1000 MG CAPS Take by mouth. Take 3 caps by mouth daily      Yes Historical Provider, MD  potassium chloride (KLOR-CON M10) 10 MEQ tablet Take 1 tablet (10 mEq total) by mouth 2 (two) times daily. 03/26/16  Yes [name], MD  simvastatin (ZOCOR) 10 MG tablet TAKE 1 TABLET BY MOUTH AT BEDTIME FOR CHOLESTEROL 03/26/16  Yes [name], MD    Family History Family History  Problem Relation Age of Onset  . Aneurysm Mother     AAA  . Heart attack Father   . Hypertension Father   . Diabetes Sister   . Diabetes Brother   . Hypertension Sister     Social History Social History  Substance Use Topics  . Smoking status: Never Smoker  . Smokeless tobacco: Never Used  . Alcohol use No     Allergies   Penicillins and Sulfonamide derivatives   Review of Systems Review of Systems ROS: Statement: All systems negative except as marked or noted in the HPI; Constitutional: +fever and chills. ; ; Eyes: Negative for eye pain, redness and discharge. ; ; ENMT: Negative for ear pain, hoarseness, +nasal congestion, rhinorrhea, sinus pressure and sore throat. ; ; Cardiovascular: Negative for chest pain, palpitations, diaphoresis, dyspnea and peripheral edema. ; ;  Respiratory: +cough. Negative for wheezing and stridor. ; ; Gastrointestinal: Negative for nausea, vomiting, diarrhea, abdominal pain, blood in stool, hematemesis, jaundice and rectal bleeding. . ; ; Genitourinary: Negative for dysuria, flank pain and hematuria. ; ; Musculoskeletal: Negative for back pain and neck pain. Negative for swelling and trauma.; ; Skin: Negative for pruritus, rash, abrasions, blisters, bruising and skin lesion.; ; Neuro: Negative for headache, lightheadedness and neck stiffness. Negative for weakness, altered level of consciousness, altered mental status, extremity weakness, paresthesias, involuntary movement, seizure and syncope.      Physical Exam Updated Vital  Signs BP 118/67   Pulse 72   Temp 99.9 F (37.7 C) (Oral)   Resp 23   Ht 5\' 5"  (1.651 m)   Wt 172 lb (78 kg)   SpO2 93%   BMI 28.62 kg/m   Physical Exam 1500: Physical examination:  Nursing notes reviewed; Vital signs and O2 SAT reviewed;  Constitutional: Well developed, Well nourished, Well hydrated, In no acute distress; Head:  Normocephalic, atraumatic; Eyes: EOMI, PERRL, No scleral icterus; ENMT: TM's clear bilat. +edemetous nasal turbinates bilat with clear rhinorrhea. Mouth and pharynx without lesions. No tonsillar exudates. No intra-oral edema. No submandibular or sublingual edema. No hoarse voice, no drooling, no stridor. No pain with manipulation of larynx. No trismus. Mouth and pharynx normal, Mucous membranes moist; Neck: Supple, Full range of motion, No lymphadenopathy. No meningeal signs.; Cardiovascular: Regular rate and rhythm, No gallop; Respiratory: Breath sounds clear & equal bilaterally, No wheezes.  Speaking full sentences with ease, Normal respiratory effort/excursion; Chest: Nontender, Movement normal; Abdomen: Soft, Nontender, Nondistended, Normal bowel sounds; Genitourinary: No CVA tenderness; Extremities: Pulses normal, No tenderness, No edema, No calf edema or asymmetry.; Neuro: AA&Ox3, Major CN grossly intact.  Speech clear. No gross focal motor or sensory deficits in extremities.; Skin: Color normal, Warm, Dry.   ED Treatments / Results  Labs (all labs ordered are listed, but only abnormal results are displayed)   EKG  EKG Interpretation None       Radiology   Procedures Procedures (including critical care time)  Medications Ordered in ED Medications  acetaminophen (TYLENOL) tablet 650 mg (650 mg Oral Given 03/28/16 1321)     Initial Impression / Assessment and Plan / ED Course  I have reviewed the triage vital signs and the nursing notes.  Pertinent labs & imaging results that were available during my care of the patient were reviewed by me and  considered in my medical decision making (see chart for details).  MDM Reviewed: previous chart, nursing note and vitals Reviewed previous: labs Interpretation: labs and x-ray   Results for orders placed or performed during the hospital encounter of 03/28/16  Rapid strep screen  Result Value Ref Range   Streptococcus, Group A Screen (Direct) NEGATIVE NEGATIVE  CBC with Differential  Result Value Ref Range   WBC 6.2 4.0 - 10.5 K/uL   RBC 4.75 3.87 - 5.11 MIL/uL   Hemoglobin 13.9 12.0 - 15.0 g/dL   HCT 40.9 81.1 - 91.4 %   MCV 80.6 78.0 - 100.0 fL   MCH 29.3 26.0 - 34.0 pg   MCHC 36.3 (H) 30.0 - 36.0 g/dL   RDW 78.2 95.6 - 21.3 %   Platelets 197 150 - 400 K/uL   Neutrophils Relative % 75 %   Neutro Abs 4.6 1.7 - 7.7 K/uL   Lymphocytes Relative 13 %   Lymphs Abs 0.8 0.7 - 4.0 K/uL   Monocytes Relative 12 %  Monocytes Absolute 0.7 0.1 - 1.0 K/uL   Eosinophils Relative 0 %   Eosinophils Absolute 0.0 0.0 - 0.7 K/uL   Basophils Relative 0 %   Basophils Absolute 0.0 0.0 - 0.1 K/uL  Comprehensive metabolic panel  Result Value Ref Range   Sodium 133 (L) 135 - 145 mmol/L   Potassium 3.4 (L) 3.5 - 5.1 mmol/L   Chloride 99 (L) 101 - 111 mmol/L   CO2 26 22 - 32 mmol/L   Glucose, Bld 86 65 - 99 mg/dL   BUN 17 6 - 20 mg/dL   Creatinine, Ser 8.290.68 0.44 - 1.00 mg/dL   Calcium 9.4 8.9 - 56.210.3 mg/dL   Total Protein 7.3 6.5 - 8.1 g/dL   Albumin 3.6 3.5 - 5.0 g/dL   AST 23 15 - 41 U/L   ALT 18 14 - 54 U/L   Alkaline Phosphatase 39 38 - 126 U/L   Total Bilirubin 0.6 0.3 - 1.2 mg/dL   GFR calc non Af Amer >60 >60 mL/min   GFR calc Af Amer >60 >60 mL/min   Anion gap 8 5 - 15  Urinalysis, Routine w reflex microscopic  Result Value Ref Range   Color, Urine YELLOW YELLOW   APPearance CLEAR CLEAR   Specific Gravity, Urine 1.023 1.005 - 1.030   pH 5.0 5.0 - 8.0   Glucose, UA NEGATIVE NEGATIVE mg/dL   Hgb urine dipstick NEGATIVE NEGATIVE   Bilirubin Urine NEGATIVE NEGATIVE   Ketones, ur  20 (A) NEGATIVE mg/dL   Protein, ur NEGATIVE NEGATIVE mg/dL   Nitrite NEGATIVE NEGATIVE   Leukocytes, UA TRACE (A) NEGATIVE   RBC / HPF 0-5 0 - 5 RBC/hpf   WBC, UA 0-5 0 - 5 WBC/hpf   Bacteria, UA NONE SEEN NONE SEEN   Mucous PRESENT   Influenza panel by PCR (type A & B, H1N1)  Result Value Ref Range   Influenza A By PCR POSITIVE (A) NEGATIVE   Influenza B By PCR NEGATIVE NEGATIVE  I-Stat CG4 Lactic Acid, ED  Result Value Ref Range   Lactic Acid, Venous 1.03 0.5 - 1.9 mmol/L   Dg Chest 2 View Result Date: 03/28/2016 CLINICAL DATA:  Cough and congestion EXAM: CHEST  2 VIEW COMPARISON:  07/11/2010 FINDINGS: Normal mediastinum and cardiac silhouette. Normal pulmonary vasculature. No evidence of effusion, infiltrate, or pneumothorax. No acute bony abnormality. Degenerate changes of the LEFT shoulder. IMPRESSION: No acute cardiopulmonary process. Electronically Signed   By: Genevive BiStewart  Edmunds M.D.   On: 03/28/2016 14:45    1715:  +flu A; will tx with tamiflu. Workup otherwise reassuring. Pt has tol PO well while in the ED without N/V. Pt wants to go home now. Dx and testing d/w pt and family.  Questions answered.  Verb understanding, agreeable to d/c home with outpt f/u.  Final Clinical Impressions(s) / ED Diagnoses   Final diagnoses:  None    New Prescriptions New Prescriptions   No medications on file     Samuel JesterKathleen Carl Butner, DO 04/01/16 1707

## 2016-03-30 ENCOUNTER — Telehealth: Payer: Self-pay | Admitting: Family Medicine

## 2016-03-30 NOTE — Telephone Encounter (Signed)
Direct call to pt, states she took tamiflu twice up to last night, but has indigestion/ chest discomfort, opting to take no more, denies fever , chills or body aches.  I recommend tylenol, fluids and rest, she is to call on Tuesday or sooner for appt if not continuing to improve

## 2016-03-31 LAB — CULTURE, GROUP A STREP (THRC)

## 2016-04-01 ENCOUNTER — Telehealth: Payer: Self-pay

## 2016-04-01 NOTE — Telephone Encounter (Signed)
Attempted to reach patient.  Unable to due to "network difficulties"

## 2016-04-01 NOTE — Telephone Encounter (Signed)
G Start going out next week, she never even took the tamiflu

## 2016-04-04 NOTE — Telephone Encounter (Signed)
Patient aware.

## 2016-06-25 ENCOUNTER — Other Ambulatory Visit: Payer: Self-pay | Admitting: Family Medicine

## 2016-06-25 DIAGNOSIS — E559 Vitamin D deficiency, unspecified: Secondary | ICD-10-CM

## 2016-06-26 NOTE — Telephone Encounter (Signed)
Last vit d = = 55  Do you want to reorder?

## 2016-08-26 ENCOUNTER — Other Ambulatory Visit: Payer: Self-pay | Admitting: Family Medicine

## 2016-08-26 DIAGNOSIS — E559 Vitamin D deficiency, unspecified: Secondary | ICD-10-CM

## 2016-09-09 ENCOUNTER — Telehealth: Payer: Self-pay | Admitting: Family Medicine

## 2016-09-09 NOTE — Telephone Encounter (Signed)
This was called in on the 18th

## 2016-09-09 NOTE — Telephone Encounter (Signed)
Lori CheMargaret is asking for a refill on DIALYVITE VITAMIN D3 MAX [id number] units [facility], please advise?

## 2016-09-12 DIAGNOSIS — M25561 Pain in right knee: Secondary | ICD-10-CM | POA: Diagnosis not present

## 2016-09-12 DIAGNOSIS — M2391 Unspecified internal derangement of right knee: Secondary | ICD-10-CM | POA: Diagnosis not present

## 2016-09-15 ENCOUNTER — Other Ambulatory Visit: Payer: Self-pay | Admitting: Family Medicine

## 2016-09-15 DIAGNOSIS — M25569 Pain in unspecified knee: Secondary | ICD-10-CM

## 2016-09-16 ENCOUNTER — Telehealth: Payer: Self-pay | Admitting: *Deleted

## 2016-09-16 NOTE — Telephone Encounter (Signed)
Patient left message nurse line stating she is returning Dr Anthony SarSimpson's call per patient Dr Lodema HongSimpson called her yesterday

## 2016-09-16 NOTE — Telephone Encounter (Signed)
I had called to get the name of the orthopedic doc she is to see regarding arthritis in knee, I have entered a generic referral the office is in [place] I understand so pls let her know this and send to the appropriate office

## 2016-09-18 NOTE — Telephone Encounter (Signed)
Returned patient's call, informed of Dr Smithfield FoodsSimpson's message below. She states [place] is using [place] [facility]

## 2016-09-23 DIAGNOSIS — M1711 Unilateral primary osteoarthritis, right knee: Secondary | ICD-10-CM | POA: Diagnosis not present

## 2016-10-03 ENCOUNTER — Other Ambulatory Visit: Payer: Self-pay

## 2016-10-03 MED ORDER — ACETAMINOPHEN ER 650 MG PO TBCR: 650.0000 mg | EXTENDED_RELEASE_TABLET | Freq: Two times a day (BID) | ORAL | 3 refills | Status: AC

## 2016-12-23 ENCOUNTER — Other Ambulatory Visit: Payer: Self-pay | Admitting: Family Medicine

## 2016-12-23 ENCOUNTER — Ambulatory Visit: Payer: Medicare Other

## 2016-12-23 DIAGNOSIS — E559 Vitamin D deficiency, unspecified: Secondary | ICD-10-CM

## 2016-12-24 ENCOUNTER — Ambulatory Visit: Payer: Medicare Other

## 2016-12-30 DIAGNOSIS — M25561 Pain in right knee: Secondary | ICD-10-CM | POA: Diagnosis not present

## 2016-12-30 DIAGNOSIS — M1711 Unilateral primary osteoarthritis, right knee: Secondary | ICD-10-CM | POA: Diagnosis not present

## 2017-01-02 ENCOUNTER — Other Ambulatory Visit: Payer: Self-pay | Admitting: Family Medicine

## 2017-01-02 NOTE — Telephone Encounter (Signed)
Seen 03/26/16

## 2017-01-19 ENCOUNTER — Ambulatory Visit (INDEPENDENT_AMBULATORY_CARE_PROVIDER_SITE_OTHER): Payer: Medicare Other

## 2017-01-19 ENCOUNTER — Ambulatory Visit: Payer: Medicare Other

## 2017-01-19 VITALS — BP 120/64 | HR 82 | Temp 98.2°F | Ht 63.0 in | Wt 161.2 lb

## 2017-01-19 DIAGNOSIS — E559 Vitamin D deficiency, unspecified: Secondary | ICD-10-CM

## 2017-01-19 DIAGNOSIS — Z0000 Encounter for general adult medical examination without abnormal findings: Secondary | ICD-10-CM | POA: Diagnosis not present

## 2017-01-19 DIAGNOSIS — Z23 Encounter for immunization: Secondary | ICD-10-CM | POA: Diagnosis not present

## 2017-01-19 MED ORDER — CHOLECALCIFEROL 1.25 MG (50000 UT) PO TABS: 1.0000 | ORAL_TABLET | ORAL | 0 refills | Status: DC

## 2017-01-19 NOTE — Patient Instructions (Addendum)
Lori Stein , Thank you for taking time to come for your Medicare Wellness Visit. I appreciate your ongoing commitment to your health goals. Please review the following plan we discussed and let me know if I can assist you in the future.   Screening recommendations/referrals: Colonoscopy: n/a Mammogram: n/a Bone Density: 2020 Recommended yearly ophthalmology/optometry visit for glaucoma screening and checkup Recommended yearly dental visit for hygiene and checkup  Vaccinations: Influenza vaccine: Given today Pneumococcal vaccine: Due 2020 Tdap vaccine: Discuss at next visit with Dr. Moshe Cipro Shingles vaccine: Discussed getting at pharmacy    Advanced directives: Discussed today. Take home and review with family  Conditions/risks identified: Be careful! Falls are your main concern  Next appointment: Follow up as needed   Preventive Care 65 Years and Older, Female Preventive care refers to lifestyle choices and visits with your health care provider that can promote health and wellness. What does preventive care include?  A yearly physical exam. This is also called an annual well check.  Dental exams once or twice a year.  Routine eye exams. Ask your health care provider how often you should have your eyes checked.  Personal lifestyle choices, including:  Daily care of your teeth and gums.  Regular physical activity.  Eating a healthy diet.  Avoiding tobacco and drug use.  Limiting alcohol use.  Practicing safe sex.  Taking low-dose aspirin every day.  Taking vitamin and mineral supplements as recommended by your health care provider. What happens during an annual well check? The services and screenings done by your health care provider during your annual well check will depend on your age, overall health, lifestyle risk factors, and family history of disease. Counseling  Your health care provider may ask you questions about your:  Alcohol use.  Tobacco  use.  Drug use.  Emotional well-being.  Home and relationship well-being.  Sexual activity.  Eating habits.  History of falls.  Memory and ability to understand (cognition).  Work and work Statistician.  Reproductive health. Screening  You may have the following tests or measurements:  Height, weight, and BMI.  Blood pressure.  Lipid and cholesterol levels. These may be checked every 5 years, or more frequently if you are over 65 years old.  Skin check.  Lung cancer screening. You may have this screening every year starting at age 42 if you have a 30-pack-year history of smoking and currently smoke or have quit within the past 15 years.  Fecal occult blood test (FOBT) of the stool. You may have this test every year starting at age 66.  Flexible sigmoidoscopy or colonoscopy. You may have a sigmoidoscopy every 5 years or a colonoscopy every 10 years starting at age 66.  Hepatitis C blood test.  Hepatitis B blood test.  Sexually transmitted disease (STD) testing.  Diabetes screening. This is done by checking your blood sugar (glucose) after you have not eaten for a while (fasting). You may have this done every 1-3 years.  Bone density scan. This is done to screen for osteoporosis. You may have this done starting at age 24.  Mammogram. This may be done every 1-2 years. Talk to your health care provider about how often you should have regular mammograms. Talk with your health care provider about your test results, treatment options, and if necessary, the need for more tests. Vaccines  Your health care provider may recommend certain vaccines, such as:  Influenza vaccine. This is recommended every year.  Tetanus, diphtheria, and acellular pertussis (Tdap, Td)  vaccine. You may need a Td booster every 10 years.  Zoster vaccine. You may need this after age 29.  Pneumococcal 13-valent conjugate (PCV13) vaccine. One dose is recommended after age 72.  Pneumococcal  polysaccharide (PPSV23) vaccine. One dose is recommended after age 30. Talk to your health care provider about which screenings and vaccines you need and how often you need them. This information is not intended to replace advice given to you by your health care provider. Make sure you discuss any questions you have with your health care provider. Document Released: 03/30/2015 Document Revised: 11/21/2015 Document Reviewed: 01/02/2015 Elsevier Interactive Patient Education  2017 [place] Prevention in the Home Falls can cause injuries. They can happen to people of all ages. There are many things you can do to make your home safe and to help prevent falls. What can I do on the outside of my home?  Regularly fix the edges of walkways and driveways and fix any cracks.  Remove anything that might make you trip as you walk through a door, such as a raised step or threshold.  Trim any bushes or trees on the path to your home.  Use bright outdoor lighting.  Clear any walking paths of anything that might make someone trip, such as rocks or tools.  Regularly check to see if handrails are loose or broken. Make sure that both sides of any steps have handrails.  Any raised decks and porches should have guardrails on the edges.  Have any leaves, snow, or ice cleared regularly.  Use sand or salt on walking paths during winter.  Clean up any spills in your garage right away. This includes oil or grease spills. What can I do in the bathroom?  Use night lights.  Install grab bars by the toilet and in the tub and shower. Do not use towel bars as grab bars.  Use non-skid mats or decals in the tub or shower.  If you need to sit down in the shower, use a plastic, non-slip stool.  Keep the floor dry. Clean up any water that spills on the floor as soon as it happens.  Remove soap buildup in the tub or shower regularly.  Attach bath mats securely with double-sided non-slip rug  tape.  Do not have throw rugs and other things on the floor that can make you trip. What can I do in the bedroom?  Use night lights.  Make sure that you have a light by your bed that is easy to reach.  Do not use any sheets or blankets that are too big for your bed. They should not hang down onto the floor.  Have a firm chair that has side arms. You can use this for support while you get dressed.  Do not have throw rugs and other things on the floor that can make you trip. What can I do in the kitchen?  Clean up any spills right away.  Avoid walking on wet floors.  Keep items that you use a lot in easy-to-reach places.  If you need to reach something above you, use a strong step stool that has a grab bar.  Keep electrical cords out of the way.  Do not use floor polish or wax that makes floors slippery. If you must use wax, use non-skid floor wax.  Do not have throw rugs and other things on the floor that can make you trip. What can I do with my stairs?  Do not leave  any items on the stairs.  Make sure that there are handrails on both sides of the stairs and use them. Fix handrails that are broken or loose. Make sure that handrails are as long as the stairways.  Check any carpeting to make sure that it is firmly attached to the stairs. Fix any carpet that is loose or worn.  Avoid having throw rugs at the top or bottom of the stairs. If you do have throw rugs, attach them to the floor with carpet tape.  Make sure that you have a light switch at the top of the stairs and the bottom of the stairs. If you do not have them, ask someone to add them for you. What else can I do to help prevent falls?  Wear shoes that:  Do not have high heels.  Have rubber bottoms.  Are comfortable and fit you well.  Are closed at the toe. Do not wear sandals.  If you use a stepladder:  Make sure that it is fully opened. Do not climb a closed stepladder.  Make sure that both sides of the  stepladder are locked into place.  Ask someone to hold it for you, if possible.  Clearly mark and make sure that you can see:  Any grab bars or handrails.  First and last steps.  Where the edge of each step is.  Use tools that help you move around (mobility aids) if they are needed. These include:  Canes.  Walkers.  Scooters.  Crutches.  Turn on the lights when you go into a dark area. Replace any light bulbs as soon as they burn out.  Set up your furniture so you have a clear path. Avoid moving your furniture around.  If any of your floors are uneven, fix them.  If there are any pets around you, be aware of where they are.  Review your medicines with your doctor. Some medicines can make you feel dizzy. This can increase your chance of falling. Ask your doctor what other things that you can do to help prevent falls. This information is not intended to replace advice given to you by your health care provider. Make sure you discuss any questions you have with your health care provider. Document Released: 12/28/2008 Document Revised: 08/09/2015 Document Reviewed: 04/07/2014 Elsevier Interactive Patient Education  2017 [place] for Adults  A healthy lifestyle and preventive care can promote health and wellness. Preventive health guidelines for adults include the following key practices.  . A routine yearly physical is a good way to check with your health care provider about your health and preventive screening. It is a chance to share any concerns and updates on your health and to receive a thorough exam.  . Visit your dentist for a routine exam and preventive care every 6 months. Brush your teeth twice a day and floss once a day. Good oral hygiene prevents tooth decay and gum disease.  . The frequency of eye exams is based on your age, health, family medical history, use  of contact lenses, and other factors. Follow your health care provider's  ecommendations for frequency of eye exams.  . Eat a healthy diet. Foods like vegetables, fruits, whole grains, low-fat dairy products, and lean protein foods contain the nutrients you need without too many calories. Decrease your intake of foods high in solid fats, added sugars, and salt. Eat the right amount of calories for you. Get information about a proper diet from your health  care provider, if necessary.  . Regular physical exercise is one of the most important things you can do for your health. Most adults should get at least 150 minutes of moderate-intensity exercise (any activity that increases your heart rate and causes you to sweat) each week. In addition, most adults need muscle-strengthening exercises on 2 or more days a week.  Silver Sneakers may be a benefit available to you. To determine eligibility, you may visit the website: www.silversneakers.com or contact program at (364)234-0351 Mon-Fri between 8AM-8PM.   . Maintain a healthy weight. The body mass index (BMI) is a screening tool to identify possible weight problems. It provides an estimate of body fat based on height and weight. Your health care provider can find your BMI and can help you achieve or maintain a healthy weight.   For adults 20 years and older: ? A BMI below 18.5 is considered underweight. ? A BMI of 18.5 to 24.9 is normal. ? A BMI of 25 to 29.9 is considered overweight. ? A BMI of 30 and above is considered obese.   . Maintain normal blood lipids and cholesterol levels by exercising and minimizing your intake of saturated fat. Eat a balanced diet with plenty of fruit and vegetables. Blood tests for lipids and cholesterol should begin at age 62 and be repeated every 5 years. If your lipid or cholesterol levels are high, you are over 50, or you are at high risk for heart disease, you may need your cholesterol levels checked more frequently. Ongoing high lipid and cholesterol levels should be treated with medicines  if diet and exercise are not working.  . If you smoke, find out from your health care provider how to quit. If you do not use tobacco, please do not start.  . If you choose to drink alcohol, please do not consume more than 2 drinks per day. One drink is considered to be 12 ounces (355 mL) of beer, 5 ounces (148 mL) of wine, or 1.5 ounces (44 mL) of liquor.  . If you are 57-72 years old, ask your health care provider if you should take aspirin to prevent strokes.  . Use sunscreen. Apply sunscreen liberally and repeatedly throughout the day. You should seek shade when your shadow is shorter than you. Protect yourself by wearing long sleeves, pants, a wide-brimmed hat, and sunglasses year round, whenever you are outdoors.  . Once a month, do a whole body skin exam, using a mirror to look at the skin on your back. Tell your health care provider of new moles, moles that have irregular borders, moles that are larger than a pencil eraser, or moles that have changed in shape or color.  Please bring a copy of your POA (Power of [place]) and/or Living Will to your next appointment.   Fall Prevention in the Home Falls can cause injuries. They can happen to people of all ages. There are many things you can do to make your home safe and to help prevent falls. What can I do on the outside of my home?  Regularly fix the edges of walkways and driveways and fix any cracks.  Remove anything that might make you trip as you walk through a door, such as a raised step or threshold.  Trim any bushes or trees on the path to your home.  Use bright outdoor lighting.  Clear any walking paths of anything that might make someone trip, such as rocks or tools.  Regularly check to see if handrails  are loose or broken. Make sure that both sides of any steps have handrails.  Any raised decks and porches should have guardrails on the edges.  Have any leaves, snow, or ice cleared regularly.  Use sand or salt on  walking paths during winter.  Clean up any spills in your garage right away. This includes oil or grease spills. What can I do in the bathroom?  Use night lights.  Install grab bars by the toilet and in the tub and shower. Do not use towel bars as grab bars.  Use non-skid mats or decals in the tub or shower.  If you need to sit down in the shower, use a plastic, non-slip stool.  Keep the floor dry. Clean up any water that spills on the floor as soon as it happens.  Remove soap buildup in the tub or shower regularly.  Attach bath mats securely with double-sided non-slip rug tape.  Do not have throw rugs and other things on the floor that can make you trip. What can I do in the bedroom?  Use night lights.  Make sure that you have a light by your bed that is easy to reach.  Do not use any sheets or blankets that are too big for your bed. They should not hang down onto the floor.  Have a firm chair that has side arms. You can use this for support while you get dressed.  Do not have throw rugs and other things on the floor that can make you trip. What can I do in the kitchen?  Clean up any spills right away.  Avoid walking on wet floors.  Keep items that you use a lot in easy-to-reach places.  If you need to reach something above you, use a strong step stool that has a grab bar.  Keep electrical cords out of the way.  Do not use floor polish or wax that makes floors slippery. If you must use wax, use non-skid floor wax.  Do not have throw rugs and other things on the floor that can make you trip. What can I do with my stairs?  Do not leave any items on the stairs.  Make sure that there are handrails on both sides of the stairs and use them. Fix handrails that are broken or loose. Make sure that handrails are as long as the stairways.  Check any carpeting to make sure that it is firmly attached to the stairs. Fix any carpet that is loose or worn.  Avoid having throw  rugs at the top or bottom of the stairs. If you do have throw rugs, attach them to the floor with carpet tape.  Make sure that you have a light switch at the top of the stairs and the bottom of the stairs. If you do not have them, ask someone to add them for you. What else can I do to help prevent falls?  Wear shoes that: ? Do not have high heels. ? Have rubber bottoms. ? Are comfortable and fit you well. ? Are closed at the toe. Do not wear sandals.  If you use a stepladder: ? Make sure that it is fully opened. Do not climb a closed stepladder. ? Make sure that both sides of the stepladder are locked into place. ? Ask someone to hold it for you, if possible.  Clearly mark and make sure that you can see: ? Any grab bars or handrails. ? First and last steps. ? Where the edge  of each step is.  Use tools that help you move around (mobility aids) if they are needed. These include: ? Canes. ? Walkers. ? Scooters. ? Crutches.  Turn on the lights when you go into a dark area. Replace any light bulbs as soon as they burn out.  Set up your furniture so you have a clear path. Avoid moving your furniture around.  If any of your floors are uneven, fix them.  If there are any pets around you, be aware of where they are.  Review your medicines with your doctor. Some medicines can make you feel dizzy. This can increase your chance of falling. Ask your doctor what other things that you can do to help prevent falls. This information is not intended to replace advice given to you by your health care provider. Make sure you discuss any questions you have with your health care provider. Document Released: 12/28/2008 Document Revised: 08/09/2015 Document Reviewed: 04/07/2014 Elsevier Interactive Patient Education  2018 [place] Maintenance, Female Adopting a healthy lifestyle and getting preventive care can go a long way to promote health and wellness. Talk with your health care  provider about what schedule of regular examinations is right for you. This is a good chance for you to check in with your provider about disease prevention and staying healthy. In between checkups, there are plenty of things you can do on your own. Experts have done a lot of research about which lifestyle changes and preventive measures are most likely to keep you healthy. Ask your health care provider for more information. Weight and diet Eat a healthy diet  Be sure to include plenty of vegetables, fruits, low-fat dairy products, and lean protein.  Do not eat a lot of foods high in solid fats, added sugars, or salt.  Get regular exercise. This is one of the most important things you can do for your health. ? Most adults should exercise for at least 150 minutes each week. The exercise should increase your heart rate and make you sweat (moderate-intensity exercise). ? Most adults should also do strengthening exercises at least twice a week. This is in addition to the moderate-intensity exercise.  Maintain a healthy weight  Body mass index (BMI) is a measurement that can be used to identify possible weight problems. It estimates body fat based on height and weight. Your health care provider can help determine your BMI and help you achieve or maintain a healthy weight.  For females 85 years of age and older: ? A BMI below 18.5 is considered underweight. ? A BMI of 18.5 to 24.9 is normal. ? A BMI of 25 to 29.9 is considered overweight. ? A BMI of 30 and above is considered obese.  Watch levels of cholesterol and blood lipids  You should start having your blood tested for lipids and cholesterol at 81 years of age, then have this test every 5 years.  You may need to have your cholesterol levels checked more often if: ? Your lipid or cholesterol levels are high. ? You are older than 81 years of age. ? You are at high risk for heart disease.  Cancer screening Lung Cancer  Lung cancer  screening is recommended for adults 29-12 years old who are at high risk for lung cancer because of a history of smoking.  A yearly low-dose CT scan of the lungs is recommended for people who: ? Currently smoke. ? Have quit within the past 15 years. ? Have at least  a 30-pack-year history of smoking. A pack year is smoking an average of one pack of cigarettes a day for 1 year.  Yearly screening should continue until it has been 15 years since you quit.  Yearly screening should stop if you develop a health problem that would prevent you from having lung cancer treatment.  Breast Cancer  Practice breast self-awareness. This means understanding how your breasts normally appear and feel.  It also means doing regular breast self-exams. Let your health care provider know about any changes, no matter how small.  If you are in your 20s or 30s, you should have a clinical breast exam (CBE) by a health care provider every 1-3 years as part of a regular health exam.  If you are 48 or older, have a CBE every year. Also consider having a breast X-ray (mammogram) every year.  If you have a family history of breast cancer, talk to your health care provider about genetic screening.  If you are at high risk for breast cancer, talk to your health care provider about having an MRI and a mammogram every year.  Breast cancer gene (BRCA) assessment is recommended for women who have family members with BRCA-related cancers. BRCA-related cancers include: ? Breast. ? Ovarian. ? Tubal. ? Peritoneal cancers.  Results of the assessment will determine the need for genetic counseling and BRCA1 and BRCA2 testing.  Cervical Cancer Your health care provider may recommend that you be screened regularly for cancer of the pelvic organs (ovaries, uterus, and vagina). This screening involves a pelvic examination, including checking for microscopic changes to the surface of your cervix (Pap test). You may be encouraged to  have this screening done every 3 years, beginning at age 38.  For women ages 40-65, health care providers may recommend pelvic exams and Pap testing every 3 years, or they may recommend the Pap and pelvic exam, combined with testing for human papilloma virus (HPV), every 5 years. Some types of HPV increase your risk of cervical cancer. Testing for HPV may also be done on women of any age with unclear Pap test results.  Other health care providers may not recommend any screening for nonpregnant women who are considered low risk for pelvic cancer and who do not have symptoms. Ask your health care provider if a screening pelvic exam is right for you.  If you have had past treatment for cervical cancer or a condition that could lead to cancer, you need Pap tests and screening for cancer for at least 20 years after your treatment. If Pap tests have been discontinued, your risk factors (such as having a new sexual partner) need to be reassessed to determine if screening should resume. Some women have medical problems that increase the chance of getting cervical cancer. In these cases, your health care provider may recommend more frequent screening and Pap tests.  Colorectal Cancer  This type of cancer can be detected and often prevented.  Routine colorectal cancer screening usually begins at 81 years of age and continues through 81 years of age.  Your health care provider may recommend screening at an earlier age if you have risk factors for colon cancer.  Your health care provider may also recommend using home test kits to check for hidden blood in the stool.  A small camera at the end of a tube can be used to examine your colon directly (sigmoidoscopy or colonoscopy). This is done to check for the earliest forms of colorectal cancer.  Routine screening  usually begins at age 30.  Direct examination of the colon should be repeated every 5-10 years through 81 years of age. However, you may need to be  screened more often if early forms of precancerous polyps or small growths are found.  Skin Cancer  Check your skin from head to toe regularly.  Tell your health care provider about any new moles or changes in moles, especially if there is a change in a mole's shape or color.  Also tell your health care provider if you have a mole that is larger than the size of a pencil eraser.  Always use sunscreen. Apply sunscreen liberally and repeatedly throughout the day.  Protect yourself by wearing long sleeves, pants, a wide-brimmed hat, and sunglasses whenever you are outside.  Heart disease, diabetes, and high blood pressure  High blood pressure causes heart disease and increases the risk of stroke. High blood pressure is more likely to develop in: ? People who have blood pressure in the high end of the normal range (130-139/85-89 mm Hg). ? People who are overweight or obese. ? People who are African American.  If you are 62-59 years of age, have your blood pressure checked every 3-5 years. If you are 6 years of age or older, have your blood pressure checked every year. You should have your blood pressure measured twice-once when you are at a hospital or clinic, and once when you are not at a hospital or clinic. Record the average of the two measurements. To check your blood pressure when you are not at a hospital or clinic, you can use: ? An automated blood pressure machine at a pharmacy. ? A home blood pressure monitor.  If you are between 46 years and 39 years old, ask your health care provider if you should take aspirin to prevent strokes.  Have regular diabetes screenings. This involves taking a blood sample to check your fasting blood sugar level. ? If you are at a normal weight and have a low risk for diabetes, have this test once every three years after 81 years of age. ? If you are overweight and have a high risk for diabetes, consider being tested at a younger age or more  often. Preventing infection Hepatitis B  If you have a higher risk for hepatitis B, you should be screened for this virus. You are considered at high risk for hepatitis B if: ? You were born in a country where hepatitis B is common. Ask your health care provider which countries are considered high risk. ? Your parents were born in a high-risk country, and you have not been immunized against hepatitis B (hepatitis B vaccine). ? You have HIV or AIDS. ? You use needles to inject street drugs. ? You live with someone who has hepatitis B. ? You have had sex with someone who has hepatitis B. ? You get hemodialysis treatment. ? You take certain medicines for conditions, including cancer, organ transplantation, and autoimmune conditions.  Hepatitis C  Blood testing is recommended for: ? Everyone born from [birth year] through 1963. ? Anyone with known risk factors for hepatitis C.  Sexually transmitted infections (STIs)  You should be screened for sexually transmitted infections (STIs) including gonorrhea and chlamydia if: ? You are sexually active and are younger than 81 years of age. ? You are older than 81 years of age and your health care provider tells you that you are at risk for this type of infection. ? Your sexual activity has changed  since you were last screened and you are at an increased risk for chlamydia or gonorrhea. Ask your health care provider if you are at risk.  If you do not have HIV, but are at risk, it may be recommended that you take a prescription medicine daily to prevent HIV infection. This is called pre-exposure prophylaxis (PrEP). You are considered at risk if: ? You are sexually active and do not regularly use condoms or know the HIV status of your partner(s). ? You take drugs by injection. ? You are sexually active with a partner who has HIV.  Talk with your health care provider about whether you are at high risk of being infected with HIV. If you choose to begin PrEP,  you should first be tested for HIV. You should then be tested every 3 months for as long as you are taking PrEP. Pregnancy  If you are premenopausal and you may become pregnant, ask your health care provider about preconception counseling.  If you may become pregnant, take 400 to 800 micrograms (mcg) of folic acid every day.  If you want to prevent pregnancy, talk to your health care provider about birth control (contraception). Osteoporosis and menopause  Osteoporosis is a disease in which the bones lose minerals and strength with aging. This can result in serious bone fractures. Your risk for osteoporosis can be identified using a bone density scan.  If you are 61 years of age or older, or if you are at risk for osteoporosis and fractures, ask your health care provider if you should be screened.  Ask your health care provider whether you should take a calcium or vitamin D supplement to lower your risk for osteoporosis.  Menopause may have certain physical symptoms and risks.  Hormone replacement therapy may reduce some of these symptoms and risks. Talk to your health care provider about whether hormone replacement therapy is right for you. Follow these instructions at home:  Schedule regular health, dental, and eye exams.  Stay current with your immunizations.  Do not use any tobacco products including cigarettes, chewing tobacco, or electronic cigarettes.  If you are pregnant, do not drink alcohol.  If you are breastfeeding, limit how much and how often you drink alcohol.  Limit alcohol intake to no more than 1 drink per day for nonpregnant women. One drink equals 12 ounces of beer, 5 ounces of wine, or 1 ounces of hard liquor.  Do not use street drugs.  Do not share needles.  Ask your health care provider for help if you need support or information about quitting drugs.  Tell your health care provider if you often feel depressed.  Tell your health care provider if you  have ever been abused or do not feel safe at home. This information is not intended to replace advice given to you by your health care provider. Make sure you discuss any questions you have with your health care provider. Document Released: 09/16/2010 Document Revised: 08/09/2015 Document Reviewed: 12/05/2014 Elsevier Interactive Patient Education  [employer].

## 2017-01-19 NOTE — Progress Notes (Addendum)
Subjective:   Lori Stein is a 83 y.o. female who presents for Medicare Annual (Subsequent) preventive examination.  Review of Systems:  N/A       Objective:     Vitals: BP 120/64 (BP Location: Left Arm, Patient Position: Sitting, Cuff Size: Normal)   Pulse 82   Temp 98.2 F (36.8 C) (Other (Comment))   Ht 5\' 3"  (1.6 m)   Wt 161 lb 4 oz (73.1 kg)   SpO2 96%   BMI 28.56 kg/m   Body mass index is 28.56 kg/m.   Tobacco Social History   Tobacco Use  Smoking Status Never Smoker  Smokeless Tobacco Never Used     Counseling given: Not Answered   Past Medical History:  Diagnosis Date  . Back pain   . Hyperlipidemia   . Hypertension   . OA (osteoarthritis) of knee    Past Surgical History:  Procedure Laterality Date  . Bilateral tubal ligation    . CHOLECYSTECTOMY  2011  . JOINT REPLACEMENT  09/2010   left knee  . removal of mole from left neck  2004   Family History  Problem Relation Age of Onset  . Aneurysm Mother        AAA  . Heart attack Father   . Hypertension Father   . Diabetes Sister   . Diabetes Brother   . Hypertension Sister    Social History   Substance and Sexual Activity  Sexual Activity No    Outpatient Encounter Medications as of 01/19/2017  Medication Sig  . acetaminophen (ARTHRITIS PAIN RELIEF) 650 MG CR tablet Take 1 tablet (650 mg total) by mouth 2 (two) times daily.  Marland Kitchen acetaminophen (TYLENOL) 325 MG tablet Take 650 mg by mouth every 6 (six) hours as needed for moderate pain.  Marland Kitchen aspirin (ASPIRIN LOW DOSE) 81 MG EC tablet Take 81 mg by mouth daily.    . Cholecalciferol (DIALYVITE VITAMIN D3 MAX) 16109 units TABS Take 1 capsule once a week by mouth.  . hydrochlorothiazide (HYDRODIURIL) 25 MG tablet TAKE 1 TABLET BY MOUTH ONCE DAILY.  . Multiple Vitamin (MULTIVITAMIN) tablet Take 1 tablet by mouth daily.    . Omega-3 Fatty Acids (FISH OIL) 1000 MG CAPS Take by mouth. Take 3 caps by mouth daily     . potassium chloride  (KLOR-CON M10) 10 MEQ tablet Take 1 tablet (10 mEq total) by mouth 2 (two) times daily.  . [DISCONTINUED] DIALYVITE VITAMIN D3 MAX 60454 units TABS TAKE 1 CAPSULE BY MOUTH ONCE A WEEK  . [DISCONTINUED] simvastatin (ZOCOR) 10 MG tablet TAKE 1 TABLET BY MOUTH AT BEDTIME FOR CHOLESTEROL  . oseltamivir (TAMIFLU) 75 MG capsule Take 1 capsule (75 mg total) by mouth every 12 (twelve) hours. (Patient not taking: Reported on 01/19/2017)   No facility-administered encounter medications on file as of 01/19/2017.     Activities of Daily Living In your present state of health, do you have any difficulty performing the following activities: 01/19/2017 03/26/2016  Hearing? N N  Vision? Y -  Difficulty concentrating or making decisions? N N  Walking or climbing stairs? Y Y  Dressing or bathing? N N  Doing errands, shopping? N N  Some recent data might be hidden    Patient Care Team: [name], MD as PCP - General    Assessment:     Exercise Activities and Dietary recommendations    Goals    None     Fall Risk Fall Risk  01/19/2017  03/26/2016 12/18/2014 08/02/2014 05/30/2013  Falls in the past year? Yes No No No No  Number falls in past yr: 1 - - - -  Injury with Fall? No - - - -  Risk for fall due to : History of fall(s);Impaired balance/gait - - - -  Follow up Falls prevention discussed - - - -   Depression Screen PHQ 2/9 Scores 01/19/2017 03/26/2016 12/18/2014 12/27/2012  PHQ - 2 Score 1 0 0 0  PHQ- 9 Score - 2 - -     Cognitive Function     6CIT Screen 01/19/2017  What Year? 0 points  What month? 0 points  What time? 0 points  Count back from 20 0 points  Months in reverse 0 points  Repeat phrase 6 points  Total Score 6    Immunization History  Administered Date(s) Administered  . Influenza Split 11/20/2011  . Influenza Whole 05/12/2007, 01/03/2009, 02/19/2010, 12/11/2010  . Influenza,inj,Quad PF,6+ Mos 12/27/2012, 01/13/2014, 12/18/2014, 03/26/2016, 01/19/2017  .  Pneumococcal Conjugate-13 10/20/2013  . Pneumococcal Polysaccharide-23 11/23/2003  . Td 11/23/2003   Screening Tests Health Maintenance  Topic Date Due  . TETANUS/TDAP  05/06/2018 (Originally 11/22/2013)  . DEXA SCAN  05/07/2018 (Originally 10/22/1999)  . INFLUENZA VACCINE  Completed  . PNA vac Low Risk Adult  Completed      Plan:      I have personally reviewed and noted the following in the patient's chart:   . Medical and social history . Use of alcohol, tobacco or illicit drugs  . Current medications and supplements . Functional ability and status . Nutritional status . Physical activity . Advanced directives . List of other physicians . Hospitalizations, surgeries, and ER visits in previous 12 months . Vitals . Screenings to include cognitive, depression, and falls . Referrals and appointments  In addition, I have reviewed and discussed with patient certain preventive protocols, quality metrics, and best practice recommendations. A written personalized care plan for preventive services as well as general preventive health recommendations were provided to patient.     Mack HookBreanna  Laken Rog, LPN  16/10/960412/13/2018

## 2017-01-19 NOTE — Progress Notes (Deleted)
Subjective:     Patient ID: Lori Stein, female   DOB: 07/30/1932, 83 y.o.   MRN: [id number]  HPI   Review of Systems     Objective:      Assessment:     ***    Plan:     I have personally reviewed and addressed the Medicare Annual Wellness questionnaire and have noted the following in the patient's chart:  A. Medical and social history B. Use of alcohol, tobacco or illicit drugs  C. Current medications and supplements D. Functional ability and status E.  Nutritional status F.  Physical activity G. Advance directives H. List of other physicians I.  Hospitalizations, surgeries, and ER visits in previous 12 months J.  Vitals K. Screenings to include cognitive, depression, and falls L. Referrals and appointments - none  In addition, I have reviewed and discussed with patient certain preventive protocols, quality metrics, and best practice recommendations. A written personalized care plan for preventive services as well as general preventive health recommendations were provided to patient.  Signed,   Mack HookBreanna  Maud Rubendall, LPN

## 2017-02-12 DIAGNOSIS — H35023 Exudative retinopathy, bilateral: Secondary | ICD-10-CM | POA: Diagnosis not present

## 2017-02-16 ENCOUNTER — Other Ambulatory Visit: Payer: Self-pay | Admitting: Family Medicine

## 2017-02-18 ENCOUNTER — Telehealth: Payer: Self-pay | Admitting: Family Medicine

## 2017-02-18 ENCOUNTER — Other Ambulatory Visit: Payer: Self-pay

## 2017-02-18 MED ORDER — SIMVASTATIN 10 MG PO TABS: ORAL_TABLET | ORAL | 1 refills | Status: DC

## 2017-02-18 NOTE — Telephone Encounter (Signed)
Spoke with brandy about medication refill, [facility]. Gearldine BienenstockBrandy [facility]  Request., Medication : simvastapin

## 2017-03-30 ENCOUNTER — Ambulatory Visit: Payer: Medicare Other | Admitting: Family Medicine

## 2017-04-01 DIAGNOSIS — M1711 Unilateral primary osteoarthritis, right knee: Secondary | ICD-10-CM | POA: Diagnosis not present

## 2017-04-01 DIAGNOSIS — M25561 Pain in right knee: Secondary | ICD-10-CM | POA: Diagnosis not present

## 2017-04-09 ENCOUNTER — Other Ambulatory Visit: Payer: Self-pay | Admitting: Family Medicine

## 2017-04-09 DIAGNOSIS — E559 Vitamin D deficiency, unspecified: Secondary | ICD-10-CM

## 2017-04-21 ENCOUNTER — Other Ambulatory Visit: Payer: Self-pay | Admitting: Family Medicine

## 2017-05-18 ENCOUNTER — Ambulatory Visit (INDEPENDENT_AMBULATORY_CARE_PROVIDER_SITE_OTHER): Payer: Medicare Other | Admitting: Family Medicine

## 2017-05-18 ENCOUNTER — Encounter: Payer: Self-pay | Admitting: Family Medicine

## 2017-05-18 VITALS — BP 120/80 | HR 64 | Resp 16 | Ht 63.0 in | Wt 164.0 lb

## 2017-05-18 DIAGNOSIS — E663 Overweight: Secondary | ICD-10-CM

## 2017-05-18 DIAGNOSIS — E782 Mixed hyperlipidemia: Secondary | ICD-10-CM

## 2017-05-18 DIAGNOSIS — I10 Essential (primary) hypertension: Secondary | ICD-10-CM

## 2017-05-18 DIAGNOSIS — E559 Vitamin D deficiency, unspecified: Secondary | ICD-10-CM

## 2017-05-18 DIAGNOSIS — Z1231 Encounter for screening mammogram for malignant neoplasm of breast: Secondary | ICD-10-CM

## 2017-05-18 DIAGNOSIS — M1711 Unilateral primary osteoarthritis, right knee: Secondary | ICD-10-CM

## 2017-05-18 NOTE — Assessment & Plan Note (Signed)
Controlled, no change in medication DASH diet and commitment to daily physical activity for a minimum of 30 minutes discussed and encouraged, as a part of hypertension management. The importance of attaining a healthy weight is also discussed.  BP/Weight 05/18/2017 01/19/2017 03/28/2016 03/26/2016 12/17/2015 12/18/2014 08/02/2014  Systolic BP 120 120 114 118 116 126 120  Diastolic BP 80 64 57 78 68 72 78  Wt. (Lbs) 164 161.25 172 172 164 173 170.4  BMI 29.05 28.56 28.62 28.62 27.29 28.79 28.36

## 2017-05-18 NOTE — Patient Instructions (Signed)
F/u in 6 month, call if you need me before  Please get mammogram scheduled at checkout   CBC, lipid, cmp and EGFR, tSH and Vit D  Your blood pressure is excellent and your exam is very good  Continue to be careful not to fall, use your cane at all times

## 2017-05-19 ENCOUNTER — Other Ambulatory Visit: Payer: Self-pay | Admitting: Family Medicine

## 2017-05-19 LAB — COMPLETE METABOLIC PANEL WITH GFR
AG RATIO: 1.5 (calc) (ref 1.0–2.5)
ALBUMIN MSPROF: 4.4 g/dL (ref 3.6–5.1)
ALKALINE PHOSPHATASE (APISO): 44 U/L (ref 33–130)
ALT: 11 U/L (ref 6–29)
AST: 13 U/L (ref 10–35)
BILIRUBIN TOTAL: 0.7 mg/dL (ref 0.2–1.2)
BUN: 25 mg/dL (ref 7–25)
CHLORIDE: 101 mmol/L (ref 98–110)
CO2: 32 mmol/L (ref 20–32)
Calcium: 11.2 mg/dL — ABNORMAL HIGH (ref 8.6–10.4)
Creat: 0.71 mg/dL (ref 0.60–0.88)
GFR, EST AFRICAN AMERICAN: 92 mL/min/{1.73_m2} (ref >=60)
GFR, Est Non African American: 79 mL/min/{1.73_m2} (ref >=60)
GLOBULIN: 2.9 g/dL (ref 1.9–3.7)
Glucose, Bld: 85 mg/dL (ref 65–139)
Potassium: 4 mmol/L (ref 3.5–5.3)
SODIUM: 141 mmol/L (ref 135–146)
TOTAL PROTEIN: 7.3 g/dL (ref 6.1–8.1)

## 2017-05-19 LAB — CBC
HCT: 44.2 % (ref 35.0–45.0)
Hemoglobin: 15.1 g/dL (ref 11.7–15.5)
MCH: 29.5 pg (ref 27.0–33.0)
MCHC: 34.2 g/dL (ref 32.0–36.0)
MCV: 86.5 fL (ref 80.0–100.0)
MPV: 11.5 fL (ref 7.5–12.5)
PLATELETS: 235 10*3/uL (ref 140–400)
RBC: 5.11 10*6/uL — AB (ref 3.80–5.10)
RDW: 13.9 % (ref 11.0–15.0)
WBC: 7.1 10*3/uL (ref 3.8–10.8)

## 2017-05-19 LAB — VITAMIN D 25 HYDROXY (VIT D DEFICIENCY, FRACTURES): VIT D 25 HYDROXY: 96 ng/mL (ref 30–100)

## 2017-05-19 LAB — LIPID PANEL
CHOLESTEROL: 193 mg/dL (ref <200)
HDL: 66 mg/dL (ref >50)
LDL CHOLESTEROL (CALC): 110 mg/dL — AB
Non-HDL Cholesterol (Calc): 127 mg/dL (calc) (ref <130)
TRIGLYCERIDES: 76 mg/dL (ref <150)
Total CHOL/HDL Ratio: 2.9 (calc) (ref <5.0)

## 2017-05-19 LAB — TSH: TSH: 2.73 m[IU]/L (ref 0.40–4.50)

## 2017-05-21 ENCOUNTER — Encounter: Payer: Self-pay | Admitting: Family Medicine

## 2017-05-21 DIAGNOSIS — M25761 Osteophyte, right knee: Secondary | ICD-10-CM

## 2017-05-21 HISTORY — DX: Osteophyte, right knee: M25.761

## 2017-05-21 NOTE — Assessment & Plan Note (Signed)
Adequately managed with intra articular injections every 3 months by orthopedics, denies any falls in the past 1 year

## 2017-05-21 NOTE — Assessment & Plan Note (Signed)
Hyperlipidemia:Low fat diet discussed and encouraged.   Lipid Panel  Lab Results  Component Value Date   CHOL 193 05/18/2017   HDL 66 05/18/2017   LDLCALC 116 (H) 03/26/2016   TRIG 76 05/18/2017   CHOLHDL 2.9 05/18/2017   Adequate control, no med change

## 2017-05-21 NOTE — Assessment & Plan Note (Signed)
Updated lab shows overcorrection, needs to stop weekly vit D

## 2017-05-21 NOTE — Assessment & Plan Note (Signed)
Deteriorated. Patient re-educated about  the importance of commitment to a  minimum of 150 minutes of exercise per week.  The importance of healthy food choices with portion control discussed. Encouraged to start a food diary, count calories and to consider  joining a support group. Sample diet sheets offered. Goals set by the patient for the next several months.   Weight /BMI 05/18/2017 01/19/2017 03/28/2016  WEIGHT 164 lb 161 lb 4 oz 172 lb  HEIGHT 5\' 3"  5\' 3"  5\' 5"   BMI 29.05 kg/m2 28.56 kg/m2 28.62 kg/m2

## 2017-05-21 NOTE — Progress Notes (Signed)
   Lori Stein     MRN: [id number]      DOB: 03/21/1932   HPI Lori Stein is here for follow up and re-evaluation of chronic medical conditions, medication management and review of any available recent lab and radiology data.  Preventive health is updated, specifically  Cancer screening and Immunization.   Follows orthopedics regularly for intraarticular injections in knee with good effect The PT denies any adverse reactions to current medications since the last visit.  There are no new concerns.  There are no specific complaints   ROS Denies recent fever or chills. Denies sinus pressure, nasal congestion, ear pain or sore throat. Denies chest congestion, productive cough or wheezing. Denies chest pains, palpitations and leg swelling Denies abdominal pain, nausea, vomiting,diarrhea or constipation.   Denies dysuria, frequency, hesitancy or incontinence. Denies  Uncontrolled joint pain, swelling and limitation in mobility. Denies headaches, seizures, numbness, or tingling. Denies depression, anxiety or insomnia. Denies skin break down or rash.   PE  BP 120/80   Pulse 64   Resp 16   Ht 5\' 3"  (1.6 m)   Wt 164 lb (74.4 kg)   SpO2 95%   BMI 29.05 kg/m   Patient alert and oriented and in no cardiopulmonary distress.  HEENT: No facial asymmetry, EOMI,   oropharynx pink and moist.  Neck supple no JVD, no mass.  Chest: Clear to auscultation bilaterally.  CVS: S1, S2 no murmurs, no S3.Regular rate.  ABD: Soft non tender.   Ext: No edema  MS: Decreased though adequate   ROM spine, shoulders, hips and markedly reduced in right knee with deformity and crepitus.  Skin: Intact, no ulcerations or rash noted.  Psych: Good eye contact, normal affect. Memory intact not anxious or depressed appearing.  CNS: CN 2-12 intact, power,  normal throughout.no focal deficits noted.   Assessment & Plan  Essential hypertension Controlled, no change in medication DASH diet and  commitment to daily physical activity for a minimum of 30 minutes discussed and encouraged, as a part of hypertension management. The importance of attaining a healthy weight is also discussed.  BP/Weight 05/18/2017 01/19/2017 03/28/2016 03/26/2016 12/17/2015 12/18/2014 08/02/2014  Systolic BP 120 120 114 118 116 126 120  Diastolic BP 80 64 57 78 68 72 78  Wt. (Lbs) 164 161.25 172 172 164 173 170.4  BMI 29.05 28.56 28.62 28.62 27.29 28.79 28.36       Hyperlipemia Hyperlipidemia:Low fat diet discussed and encouraged.   Lipid Panel  Lab Results  Component Value Date   CHOL 193 05/18/2017   HDL 66 05/18/2017   LDLCALC 116 (H) 03/26/2016   TRIG 76 05/18/2017   CHOLHDL 2.9 05/18/2017   Adequate control, no med change    Overweight (BMI 25.0-29.9) Deteriorated. Patient re-educated about  the importance of commitment to a  minimum of 150 minutes of exercise per week.  The importance of healthy food choices with portion control discussed. Encouraged to start a food diary, count calories and to consider  joining a support group. Sample diet sheets offered. Goals set by the patient for the next several months.   Weight /BMI 05/18/2017 01/19/2017 03/28/2016  WEIGHT 164 lb 161 lb 4 oz 172 lb  HEIGHT 5\' 3"  5\' 3"  5\' 5"   BMI 29.05 kg/m2 28.56 kg/m2 28.62 kg/m2      Osteoarthritis of right knee Adequately managed with intra articular injections every 3 months by orthopedics, denies any falls in the past 1 year

## 2017-05-28 ENCOUNTER — Ambulatory Visit (HOSPITAL_COMMUNITY)
Admission: RE | Admit: 2017-05-28 | Discharge: 2017-05-28 | Disposition: A | Discharge status: Home / Self Care | Payer: Medicare Other | Source: Ambulatory Visit | Attending: Family Medicine | Admitting: Family Medicine

## 2017-05-28 ENCOUNTER — Encounter (HOSPITAL_COMMUNITY): Payer: Self-pay

## 2017-05-28 DIAGNOSIS — Z1231 Encounter for screening mammogram for malignant neoplasm of breast: Secondary | ICD-10-CM | POA: Diagnosis not present

## 2017-06-09 ENCOUNTER — Encounter: Payer: Self-pay | Admitting: Family Medicine

## 2017-06-09 DIAGNOSIS — I10 Essential (primary) hypertension: Secondary | ICD-10-CM

## 2017-06-09 DIAGNOSIS — E785 Hyperlipidemia, unspecified: Secondary | ICD-10-CM

## 2017-07-01 DIAGNOSIS — M1711 Unilateral primary osteoarthritis, right knee: Secondary | ICD-10-CM | POA: Diagnosis not present

## 2017-07-01 DIAGNOSIS — M25561 Pain in right knee: Secondary | ICD-10-CM | POA: Diagnosis not present

## 2017-07-29 ENCOUNTER — Telehealth: Payer: Self-pay | Admitting: Family Medicine
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
## 2017-07-30 NOTE — Telephone Encounter (Signed)
Called patient to schedule an appt, no answer, no voicemail, I will try again before I leave for the day.

## 2017-07-30 NOTE — Telephone Encounter (Signed)
I know you don't have any openings but just wanted to route to a provider to get a response incase there was something mild that could be sent or I can have her wait until next week. Please advise

## 2017-07-30 NOTE — Telephone Encounter (Signed)
Please schedule for tomorrow

## 2017-08-05 ENCOUNTER — Ambulatory Visit (INDEPENDENT_AMBULATORY_CARE_PROVIDER_SITE_OTHER): Payer: Medicare Other | Admitting: Family Medicine

## 2017-08-05 ENCOUNTER — Encounter: Payer: Self-pay | Admitting: Family Medicine

## 2017-08-05 ENCOUNTER — Other Ambulatory Visit: Payer: Self-pay

## 2017-08-05 VITALS — BP 110/68 | HR 66 | Ht 63.0 in | Wt 164.1 lb

## 2017-08-05 DIAGNOSIS — E782 Mixed hyperlipidemia: Secondary | ICD-10-CM | POA: Diagnosis not present

## 2017-08-05 DIAGNOSIS — F409 Phobic anxiety disorder, unspecified: Secondary | ICD-10-CM

## 2017-08-05 DIAGNOSIS — F5105 Insomnia due to other mental disorder: Secondary | ICD-10-CM | POA: Diagnosis not present

## 2017-08-05 DIAGNOSIS — F419 Anxiety disorder, unspecified: Secondary | ICD-10-CM | POA: Diagnosis not present

## 2017-08-05 DIAGNOSIS — I10 Essential (primary) hypertension: Secondary | ICD-10-CM | POA: Diagnosis not present

## 2017-08-05 MED ORDER — TEMAZEPAM 7.5 MG PO CAPS: 7.5000 mg | ORAL_CAPSULE | Freq: Every evening | ORAL | 1 refills | Status: DC | PRN

## 2017-08-05 NOTE — Patient Instructions (Signed)
F/U in 6 weeks  New for sleep and  To help within nerves is omne capsule at bedtime as needed, You may take it every night or every other night , see how you do, call if you have any problems with it please  Remember to use your alarm system, and do like you always have, say prayers and put everything in God's hands, and keep a close contact with your  Trusted family and neighbors
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
## 2017-08-09 ENCOUNTER — Encounter: Payer: Self-pay | Admitting: Family Medicine

## 2017-08-09 DIAGNOSIS — F5105 Insomnia due to other mental disorder: Secondary | ICD-10-CM | POA: Insufficient documentation

## 2017-08-09 DIAGNOSIS — F409 Phobic anxiety disorder, unspecified: Secondary | ICD-10-CM | POA: Insufficient documentation

## 2017-08-09 DIAGNOSIS — F419 Anxiety disorder, unspecified: Secondary | ICD-10-CM | POA: Insufficient documentation

## 2017-08-09 NOTE — Assessment & Plan Note (Signed)
Controlled, no change in medication  
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
## 2017-08-09 NOTE — Assessment & Plan Note (Signed)
Situational, hopefully this will resolve over time

## 2017-08-11 ENCOUNTER — Telehealth: Payer: Self-pay | Admitting: Family Medicine

## 2017-08-11 NOTE — Telephone Encounter (Signed)
Patient called in to let Dr.Simpson know that when she took temazepam she wokeup very dizzy the next morning, says the room was spinning. She drank some water & felt better but ended up with a headache. Patient says she did not continue the medication and woke up fine this morning.  She does not want to continue this medication she is afraid she will fall & break something from the dizziness.  Cb#: [phone]/ [phone]

## 2017-08-14 NOTE — Telephone Encounter (Signed)
I spoke with the pt, states she took the capsule last night , had a great night's rest and does not feel badly this morning. Sates she feels very well. I advised her to not to take the medication every night but every 2 to 3 nights , as needed, and she agrees

## 2017-09-15 ENCOUNTER — Other Ambulatory Visit: Payer: Self-pay | Admitting: Family Medicine

## 2017-09-16 ENCOUNTER — Ambulatory Visit: Payer: Medicare Other | Admitting: Family Medicine

## 2017-10-20 ENCOUNTER — Other Ambulatory Visit: Payer: Self-pay | Admitting: Family Medicine

## 2017-11-18 ENCOUNTER — Encounter: Payer: Self-pay | Admitting: Family Medicine

## 2017-11-18 ENCOUNTER — Ambulatory Visit (INDEPENDENT_AMBULATORY_CARE_PROVIDER_SITE_OTHER): Payer: Medicare Other | Admitting: Family Medicine

## 2017-11-18 VITALS — BP 122/78 | HR 75 | Resp 16 | Ht 63.0 in | Wt 160.0 lb

## 2017-11-18 DIAGNOSIS — M1711 Unilateral primary osteoarthritis, right knee: Secondary | ICD-10-CM

## 2017-11-18 DIAGNOSIS — E782 Mixed hyperlipidemia: Secondary | ICD-10-CM | POA: Diagnosis not present

## 2017-11-18 DIAGNOSIS — E663 Overweight: Secondary | ICD-10-CM

## 2017-11-18 DIAGNOSIS — Z1211 Encounter for screening for malignant neoplasm of colon: Secondary | ICD-10-CM | POA: Diagnosis not present

## 2017-11-18 DIAGNOSIS — I10 Essential (primary) hypertension: Secondary | ICD-10-CM | POA: Diagnosis not present

## 2017-11-18 LAB — HEMOCCULT GUIAC POC 1CARD (OFFICE)
Card #2 Fecal Occult Blod, POC: NEGATIVE
FECAL OCCULT BLD: NEGATIVE
FECAL OCCULT BLD: NEGATIVE

## 2017-11-18 NOTE — Patient Instructions (Addendum)
Wellness with nurse end Novemeber  Please get fasting lipid, cmp and eGFR as soon as possible, you are to leave with order  mD follow up in 4 months call if you need me sooner  pls call and come for flu vaccine this Friday between 8 aadn 12 midday  Exam is excellent  Stool cards are checked today  Ears are flushed today  Be careful not to fall and I hope you continue do do well   Use your cane at all times to reduce your chance of falling Thank you  for choosing [facility]. We consider it a privelige to serve you.  Delivering excellent health care in a caring and  compassionate way is our goal.  Partnering with you,  so that together we can achieve this goal is our strategy.

## 2017-11-18 NOTE — Progress Notes (Signed)
   Lori Stein     MRN: [id number]      DOB: 12-Sep-1932   HPI Lori Stein is here for follow up and re-evaluation of chronic medical conditions, medication management and review of any available recent lab and radiology data.  Preventive health is updated, specifically  Cancer screening and Immunization.   Questions or concerns regarding consultations or procedures which the PT has had in the interim are  addressed. The PT denies any adverse reactions to current medications since the last visit.  C/o generalized osteoarthritis and also bilateral ear fulness right worse than left ROS Denies recent fever or chills. Denies sinus pressure, nasal congestion,  or sore throat. Denies chest congestion, productive cough or wheezing. Denies chest pains, palpitations and leg swelling Denies abdominal pain, nausea, vomiting,diarrhea or constipation.   Denies dysuria, frequency, hesitancy or incontinence. C/o joint pain, swelling and limitation in mobility. Denies headaches, seizures, numbness, or tingling. Denies depression, anxiety or insomnia. Denies skin break down or rash.   PE  BP 122/78   Pulse 75   Resp 16   Ht 5\' 3"  (1.6 m)   Wt 160 lb (72.6 kg)   SpO2 96%   BMI 28.34 kg/m   Patient alert and oriented and in no cardiopulmonary distress.  HEENT: No facial asymmetry, EOMI,   oropharynx pink and moist.  Neck supple no JVD, no mass.bilateral cerumen impaction left greater than right successful irrigation by  Nurse  Chest: Clear to auscultation bilaterally.  CVS: S1, S2 no murmurs, no S3.Regular rate.  ABD: Soft non tender.   Ext: No edema  GE:ZMOQHUTML ROM spine, shoulders, hips and knees.  Skin: Intact, no ulcerations or rash noted.  Psych: Good eye contact, normal affect. Memory intact not anxious or depressed appearing.  CNS: CN 2-12 intact, power,  normal throughout.no focal deficits noted.   Assessment & Plan  Essential hypertension Controlled, no change  in medication DASH diet and commitment to daily physical activity for a minimum of 30 minutes discussed and encouraged, as a part of hypertension management. The importance of attaining a healthy weight is also discussed.  BP/Weight 11/18/2017 08/05/2017 05/18/2017 01/19/2017 03/28/2016 03/26/2016 12/17/2015  Systolic BP 122 110 120 120 114 118 116  Diastolic BP 78 68 80 64 57 78 68  Wt. (Lbs) 160 164.12 164 161.25 172 172 164  BMI 28.34 29.07 29.05 28.56 28.62 28.62 27.29       Overweight (BMI 25.0-29.9) Improved. Patient re-educated about  the importance of commitment to a  minimum of 150 minutes of exercise per week.  The importance of healthy food choices with portion control discussed. Encouraged to start a food diary, count calories and to consider  joining a support group. Sample diet sheets offered. Goals set by the patient for the next several months.   Weight /BMI 11/18/2017 08/05/2017 05/18/2017  WEIGHT 160 lb 164 lb 1.9 oz 164 lb  HEIGHT 5\' 3"  5\' 3"  5\' 3"   BMI 28.34 kg/m2 29.07 kg/m2 29.05 kg/m2      Hyperlipemia Hyperlipidemia:Low fat diet discussed and encouraged.   Lipid Panel  Lab Results  Component Value Date   CHOL 193 05/18/2017   HDL 66 05/18/2017   LDLCALC 110 (H) 05/18/2017   TRIG 76 05/18/2017   CHOLHDL 2.9 05/18/2017     Updated lab needed , not at goal when last checked  Osteoarthritis of right knee Fall precautions and home safety discussed, she has not fallen since her last  visit

## 2017-11-19 NOTE — Assessment & Plan Note (Signed)
Hyperlipidemia:Low fat diet discussed and encouraged.   Lipid Panel  Lab Results  Component Value Date   CHOL 193 05/18/2017   HDL 66 05/18/2017   LDLCALC 110 (H) 05/18/2017   TRIG 76 05/18/2017   CHOLHDL 2.9 05/18/2017     Updated lab needed , not at goal when last checked

## 2017-11-19 NOTE — Assessment & Plan Note (Signed)
Improved. Patient re-educated about  the importance of commitment to a  minimum of 150 minutes of exercise per week.  The importance of healthy food choices with portion control discussed. Encouraged to start a food diary, count calories and to consider  joining a support group. Sample diet sheets offered. Goals set by the patient for the next several months.   Weight /BMI 11/18/2017 08/05/2017 05/18/2017  WEIGHT 160 lb 164 lb 1.9 oz 164 lb  HEIGHT 5\' 3"  5\' 3"  5\' 3"   BMI 28.34 kg/m2 29.07 kg/m2 29.05 kg/m2

## 2017-11-19 NOTE — Assessment & Plan Note (Signed)
Controlled, no change in medication DASH diet and commitment to daily physical activity for a minimum of 30 minutes discussed and encouraged, as a part of hypertension management. The importance of attaining a healthy weight is also discussed.  BP/Weight 11/18/2017 08/05/2017 05/18/2017 01/19/2017 03/28/2016 03/26/2016 12/17/2015  Systolic BP 122 110 120 120 114 118 116  Diastolic BP 78 68 80 64 57 78 68  Wt. (Lbs) 160 164.12 164 161.25 172 172 164  BMI 28.34 29.07 29.05 28.56 28.62 28.62 27.29

## 2017-11-19 NOTE — Assessment & Plan Note (Addendum)
Fall precautions and home safety discussed, she has not fallen since her last  visit

## 2017-11-30 ENCOUNTER — Ambulatory Visit (INDEPENDENT_AMBULATORY_CARE_PROVIDER_SITE_OTHER): Payer: Medicare Other

## 2017-11-30 DIAGNOSIS — Z23 Encounter for immunization: Secondary | ICD-10-CM

## 2017-11-30 DIAGNOSIS — E782 Mixed hyperlipidemia: Secondary | ICD-10-CM | POA: Diagnosis not present

## 2017-11-30 DIAGNOSIS — I10 Essential (primary) hypertension: Secondary | ICD-10-CM | POA: Diagnosis not present

## 2017-12-01 LAB — COMPLETE METABOLIC PANEL WITH GFR
AG Ratio: 1.5 (calc) (ref 1.0–2.5)
ALBUMIN MSPROF: 4 g/dL (ref 3.6–5.1)
ALKALINE PHOSPHATASE (APISO): 39 U/L (ref 33–130)
ALT: 10 U/L (ref 6–29)
AST: 11 U/L (ref 10–35)
BILIRUBIN TOTAL: 0.6 mg/dL (ref 0.2–1.2)
BUN: 23 mg/dL (ref 7–25)
CHLORIDE: 104 mmol/L (ref 98–110)
CO2: 29 mmol/L (ref 20–32)
CREATININE: 0.65 mg/dL (ref 0.60–0.88)
Calcium: 10.3 mg/dL (ref 8.6–10.4)
GFR, EST AFRICAN AMERICAN: 95 mL/min/{1.73_m2} (ref >=60)
GFR, Est Non African American: 82 mL/min/{1.73_m2} (ref >=60)
GLOBULIN: 2.6 g/dL (ref 1.9–3.7)
Glucose, Bld: 84 mg/dL (ref 65–99)
Potassium: 4.1 mmol/L (ref 3.5–5.3)
Sodium: 141 mmol/L (ref 135–146)
Total Protein: 6.6 g/dL (ref 6.1–8.1)

## 2017-12-01 LAB — LIPID PANEL
CHOLESTEROL: 166 mg/dL (ref <200)
HDL: 56 mg/dL (ref >50)
LDL CHOLESTEROL (CALC): 93 mg/dL
Non-HDL Cholesterol (Calc): 110 mg/dL (calc) (ref <130)
TRIGLYCERIDES: 82 mg/dL (ref <150)
Total CHOL/HDL Ratio: 3 (calc) (ref <5.0)

## 2017-12-16 ENCOUNTER — Other Ambulatory Visit: Payer: Self-pay | Admitting: Family Medicine

## 2018-01-25 ENCOUNTER — Ambulatory Visit: Payer: Medicare Other

## 2018-02-01 ENCOUNTER — Ambulatory Visit (INDEPENDENT_AMBULATORY_CARE_PROVIDER_SITE_OTHER): Payer: Medicare Other

## 2018-02-01 VITALS — BP 141/73 | HR 61 | Resp 12 | Ht 63.0 in | Wt 158.0 lb

## 2018-02-01 DIAGNOSIS — Z0000 Encounter for general adult medical examination without abnormal findings: Secondary | ICD-10-CM | POA: Diagnosis not present

## 2018-02-01 NOTE — Patient Instructions (Signed)
[name] , Thank you for taking time to come for your Medicare Wellness Visit. I appreciate your ongoing commitment to your health goals. Please review the following plan we discussed and let me know if I can assist you in the future.   Screening recommendations/referrals: Colonoscopy: refused  Mammogram: up to date  Bone Density: up to date  Recommended yearly ophthalmology/optometry visit for glaucoma screening and checkup Recommended yearly dental visit for hygiene and checkup  Vaccinations: Influenza vaccine: up to date  Pneumococcal vaccine: up to date  Tdap vaccine: up to date  Shingles vaccine: postponed     Advanced directives: in place   Conditions/risks identified: advanced age, hypertension, arthritis   Next appointment: wellness visit in one year    Preventive Care 1665 Years and Older, Female Preventive care refers to lifestyle choices and visits with your health care provider that can promote health and wellness. What does preventive care include?  A yearly physical exam. This is also called an annual well check.  Dental exams once or twice a year.  Routine eye exams. Ask your health care provider how often you should have your eyes checked.  Personal lifestyle choices, including:  Daily care of your teeth and gums.  Regular physical activity.  Eating a healthy diet.  Avoiding tobacco and drug use.  Limiting alcohol use.  Practicing safe sex.  Taking low-dose aspirin every day.  Taking vitamin and mineral supplements as recommended by your health care provider. What happens during an annual well check? The services and screenings done by your health care provider during your annual well check will depend on your age, overall health, lifestyle risk factors, and family history of disease. Counseling  Your health care provider may ask you questions about your:  Alcohol use.  Tobacco use.  Drug use.  Emotional well-being.  Home and  relationship well-being.  Sexual activity.  Eating habits.  History of falls.  Memory and ability to understand (cognition).  Work and work Astronomerenvironment.  Reproductive health. Screening  You may have the following tests or measurements:  Height, weight, and BMI.  Blood pressure.  Lipid and cholesterol levels. These may be checked every 5 years, or more frequently if you are over 82 years old.  Skin check.  Lung cancer screening. You may have this screening every year starting at age 82 if you have a 30-pack-year history of smoking and currently smoke or have quit within the past 15 years.  Fecal occult blood test (FOBT) of the stool. You may have this test every year starting at age 82.  Flexible sigmoidoscopy or colonoscopy. You may have a sigmoidoscopy every 5 years or a colonoscopy every 10 years starting at age 82.  Hepatitis C blood test.  Hepatitis B blood test.  Sexually transmitted disease (STD) testing.  Diabetes screening. This is done by checking your blood sugar (glucose) after you have not eaten for a while (fasting). You may have this done every 1-3 years.  Bone density scan. This is done to screen for osteoporosis. You may have this done starting at age 82.  Mammogram. This may be done every 1-2 years. Talk to your health care provider about how often you should have regular mammograms. Talk with your health care provider about your test results, treatment options, and if necessary, the need for more tests. Vaccines  Your health care provider may recommend certain vaccines, such as:  Influenza vaccine. This is recommended every year.  Tetanus, diphtheria, and acellular pertussis (Tdap,  Td) vaccine. You may need a Td booster every 10 years.  Zoster vaccine. You may need this after age 31.  Pneumococcal 13-valent conjugate (PCV13) vaccine. One dose is recommended after age 13.  Pneumococcal polysaccharide (PPSV23) vaccine. One dose is recommended after  age 49. Talk to your health care provider about which screenings and vaccines you need and how often you need them. This information is not intended to replace advice given to you by your health care provider. Make sure you discuss any questions you have with your health care provider. Document Released: 03/30/2015 Document Revised: 11/21/2015 Document Reviewed: 01/02/2015 Elsevier Interactive Patient Education  2017 [place] Prevention in the Home Falls can cause injuries. They can happen to people of all ages. There are many things you can do to make your home safe and to help prevent falls. What can I do on the outside of my home?  Regularly fix the edges of walkways and driveways and fix any cracks.  Remove anything that might make you trip as you walk through a door, such as a raised step or threshold.  Trim any bushes or trees on the path to your home.  Use bright outdoor lighting.  Clear any walking paths of anything that might make someone trip, such as rocks or tools.  Regularly check to see if handrails are loose or broken. Make sure that both sides of any steps have handrails.  Any raised decks and porches should have guardrails on the edges.  Have any leaves, snow, or ice cleared regularly.  Use sand or salt on walking paths during winter.  Clean up any spills in your garage right away. This includes oil or grease spills. What can I do in the bathroom?  Use night lights.  Install grab bars by the toilet and in the tub and shower. Do not use towel bars as grab bars.  Use non-skid mats or decals in the tub or shower.  If you need to sit down in the shower, use a plastic, non-slip stool.  Keep the floor dry. Clean up any water that spills on the floor as soon as it happens.  Remove soap buildup in the tub or shower regularly.  Attach bath mats securely with double-sided non-slip rug tape.  Do not have throw rugs and other things on the floor that can  make you trip. What can I do in the bedroom?  Use night lights.  Make sure that you have a light by your bed that is easy to reach.  Do not use any sheets or blankets that are too big for your bed. They should not hang down onto the floor.  Have a firm chair that has side arms. You can use this for support while you get dressed.  Do not have throw rugs and other things on the floor that can make you trip. What can I do in the kitchen?  Clean up any spills right away.  Avoid walking on wet floors.  Keep items that you use a lot in easy-to-reach places.  If you need to reach something above you, use a strong step stool that has a grab bar.  Keep electrical cords out of the way.  Do not use floor polish or wax that makes floors slippery. If you must use wax, use non-skid floor wax.  Do not have throw rugs and other things on the floor that can make you trip. What can I do with my stairs?  Do not  leave any items on the stairs.  Make sure that there are handrails on both sides of the stairs and use them. Fix handrails that are broken or loose. Make sure that handrails are as long as the stairways.  Check any carpeting to make sure that it is firmly attached to the stairs. Fix any carpet that is loose or worn.  Avoid having throw rugs at the top or bottom of the stairs. If you do have throw rugs, attach them to the floor with carpet tape.  Make sure that you have a light switch at the top of the stairs and the bottom of the stairs. If you do not have them, ask someone to add them for you. What else can I do to help prevent falls?  Wear shoes that:  Do not have high heels.  Have rubber bottoms.  Are comfortable and fit you well.  Are closed at the toe. Do not wear sandals.  If you use a stepladder:  Make sure that it is fully opened. Do not climb a closed stepladder.  Make sure that both sides of the stepladder are locked into place.  Ask someone to hold it for you,  if possible.  Clearly mark and make sure that you can see:  Any grab bars or handrails.  First and last steps.  Where the edge of each step is.  Use tools that help you move around (mobility aids) if they are needed. These include:  Canes.  Walkers.  Scooters.  Crutches.  Turn on the lights when you go into a dark area. Replace any light bulbs as soon as they burn out.  Set up your furniture so you have a clear path. Avoid moving your furniture around.  If any of your floors are uneven, fix them.  If there are any pets around you, be aware of where they are.  Review your medicines with your doctor. Some medicines can make you feel dizzy. This can increase your chance of falling. Ask your doctor what other things that you can do to help prevent falls. This information is not intended to replace advice given to you by your health care provider. Make sure you discuss any questions you have with your health care provider. Document Released: 12/28/2008 Document Revised: 08/09/2015 Document Reviewed: 04/07/2014 Elsevier Interactive Patient Education  2017 [employer].

## 2018-02-01 NOTE — Progress Notes (Signed)
Subjective:   BIRDENA KINGMA is a 84 y.o. female who presents for Medicare Annual (Subsequent) preventive examination.  Review of Systems:   Cardiac Risk Factors include: advanced age (>1men, >12 women);hypertension;obesity (BMI >30kg/m2);dyslipidemia     Objective:     Vitals: BP (!) 141/73   Pulse 61   Resp 12   Ht 5\' 3"  (1.6 m)   Wt 158 lb (71.7 kg)   SpO2 94%   BMI 27.99 kg/m   Body mass index is 27.99 kg/m.  Advanced Directives 02/01/2018 03/28/2016  Does Patient Have a Medical Advance Directive? Yes Yes  Type of Advance Directive Living will Healthcare Power of Attorney  Does patient want to make changes to medical advance directive? No - Patient declined -  Copy of Healthcare Power of Attorney in Chart? - No - copy requested    Tobacco Social History   Tobacco Use  Smoking Status Never Smoker  Smokeless Tobacco Never Used     Counseling given: Not Answered   Clinical Intake:  Pre-visit preparation completed: Yes  Pain : No/denies pain Pain Score: 0-No pain     BMI - recorded: 28 Nutritional Status: BMI 25 -29 Overweight Nutritional Risks: None Diabetes: No  How often do you need to have someone help you when you read instructions, pamphlets, or other written materials from your doctor or pharmacy?: 2 - Rarely What is the last grade level you completed in school?: 12 grade   Interpreter Needed?: No  Information entered by :: Hartford Poli LPN   Past Medical History:  Diagnosis Date  . Back pain   . Hyperlipidemia   . Hypertension   . OA (osteoarthritis) of knee   . Osteophyte, right knee 05/21/2017   Past Surgical History:  Procedure Laterality Date  . Bilateral tubal ligation    . CHOLECYSTECTOMY  2011  . JOINT REPLACEMENT  09/2010   left knee  . removal of mole from left neck  2004   Family History  Problem Relation Age of Onset  . Aneurysm Mother        AAA  . Heart attack Father   . Hypertension Father   . Diabetes  Sister   . Diabetes Brother   . Hypertension Sister    Social History   Socioeconomic History  . Marital status: Widowed    Spouse name: Not on file  . Number of children: 3  . Years of education: 88  . Highest education level: Some college, no degree  Occupational History  . Occupation: Retired  Engineer, production  . Financial resource strain: Somewhat hard  . Food insecurity:    Worry: Never true    Inability: Never true  . Transportation needs:    Medical: No    Non-medical: No  Tobacco Use  . Smoking status: Never Smoker  . Smokeless tobacco: Never Used  Substance and Sexual Activity  . Alcohol use: No  . Drug use: No  . Sexual activity: Not Currently  Lifestyle  . Physical activity:    Days per week: 0 days    Minutes per session: 0 min  . Stress: Only a little  Relationships  . Social connections:    Talks on phone: More than three times a week    Gets together: More than three times a week    Attends religious service: More than 4 times per year    Active member of club or organization: Yes    Attends meetings of clubs or organizations:  More than 4 times per year    Relationship status: Widowed  Other Topics Concern  . Not on file  Social History Narrative   Lives alone, sister sometimes comes and stays with her     Outpatient Encounter Medications as of 02/01/2018  Medication Sig  . acetaminophen (ARTHRITIS PAIN RELIEF) 650 MG CR tablet Take 1 tablet (650 mg total) by mouth 2 (two) times daily.  Marland Kitchen aspirin (ASPIRIN LOW DOSE) 81 MG EC tablet Take 81 mg by mouth daily.    . hydrochlorothiazide (HYDRODIURIL) 25 MG tablet TAKE 1 TABLET BY MOUTH ONCE DAILY.  Marland Kitchen Omega-3 Fatty Acids (FISH OIL) 1000 MG CAPS Take by mouth. Take 3 caps by mouth daily     . potassium chloride (K-DUR,KLOR-CON) 10 MEQ tablet TAKE 1 TABLET BY MOUTH TWICE DAILY  . simvastatin (ZOCOR) 10 MG tablet TAKE 1 TABLET BY MOUTH AT BEDTIME FOR CHOLESTEROL   No facility-administered encounter  medications on file as of 02/01/2018.     Activities of Daily Living In your present state of health, do you have any difficulty performing the following activities: 02/01/2018  Hearing? N  Vision? N  Difficulty concentrating or making decisions? Y  Walking or climbing stairs? Y  Dressing or bathing? N  Doing errands, shopping? Y  Preparing Food and eating ? N  Using the Toilet? N  In the past six months, have you accidently leaked urine? Y  Do you have problems with loss of bowel control? N  Managing your Medications? N  Managing your Finances? N  Housekeeping or managing your Housekeeping? N  Some recent data might be hidden    Patient Care Team: [name], MD as PCP - General    Assessment:   This is a routine wellness examination for [facility].  Exercise Activities and Dietary recommendations Current Exercise Habits: The patient does not participate in regular exercise at present, Exercise limited by: orthopedic condition(s)  Goals    . Exercise 150 minutes per week (moderate activity)    . Prevent Falls       Fall Risk Fall Risk  02/01/2018 11/18/2017 08/05/2017 05/18/2017 01/19/2017  Falls in the past year? 0 No No No Yes  Number falls in past yr: - - - - 1  Injury with Fall? - - - - No  Risk for fall due to : Impaired mobility - - - History of fall(s);Impaired balance/gait  Follow up Falls prevention discussed - - - Falls prevention discussed   Is the patient's home free of loose throw rugs in walkways, pet beds, electrical cords, etc?   no      Grab bars in the bathroom? yes      Handrails on the stairs?   yes      Adequate lighting?   yes  Timed Get Up and Go performed: Patient able to perform in 10 seconds with the aide of a cane   Depression Screen PHQ 2/9 Scores 02/01/2018 11/18/2017 08/05/2017 01/19/2017  PHQ - 2 Score 0 0 2 1  PHQ- 9 Score - 2 7 -     Cognitive Function     6CIT Screen 02/01/2018 01/19/2017  What Year? 0 points 0 points  What  month? 0 points 0 points  What time? 0 points 0 points  Count back from 20 0 points 0 points  Months in reverse 0 points 0 points  Repeat phrase 0 points 6 points  Total Score 0 6    Immunization History  Administered  Date(s) Administered  . Influenza Split 11/20/2011  . Influenza Whole 05/12/2007, 01/03/2009, 02/19/2010, 12/11/2010  . Influenza,inj,Quad PF,6+ Mos 12/27/2012, 01/13/2014, 12/18/2014, 03/26/2016, 01/19/2017, 11/30/2017  . Pneumococcal Conjugate-13 10/20/2013  . Pneumococcal Polysaccharide-23 11/23/2003  . Td 11/23/2003    Qualifies for Shingles Vaccine?postponed   Screening Tests Health Maintenance  Topic Date Due  . TETANUS/TDAP  05/06/2018 (Originally 11/22/2013)  . DEXA SCAN  05/07/2018 (Originally 10/22/1999)  . INFLUENZA VACCINE  Completed  . PNA vac Low Risk Adult  Completed    Cancer Screenings: Lung: Low Dose CT Chest recommended if Age 35-80 years, 30 pack-year currently smoking OR have quit w/in 15years. Patient does not qualify. Breast:  Up to date on Mammogram? Yes   Up to date of Bone Density/Dexa? Yes Colorectal: refused   Additional Screenings:  Hepatitis C Screening: N/A      Plan:   Continue to increase physical activity and decrease fall risk   I have personally reviewed and noted the following in the patient's chart:   . Medical and social history . Use of alcohol, tobacco or illicit drugs  . Current medications and supplements . Functional ability and status . Nutritional status . Physical activity . Advanced directives . List of other physicians . Hospitalizations, surgeries, and ER visits in previous 12 months . Vitals . Screenings to include cognitive, depression, and falls . Referrals and appointments  In addition, I have reviewed and discussed with patient certain preventive protocols, quality metrics, and best practice recommendations. A written personalized care plan for preventive services as well as general preventive  health recommendations were provided to patient.     Fabian NovemberAnita J , LPN  [id number]/18/2019

## 2018-02-15 ENCOUNTER — Telehealth: Payer: Self-pay | Admitting: Family Medicine

## 2018-02-15 ENCOUNTER — Other Ambulatory Visit: Payer: Self-pay

## 2018-02-15 MED ORDER — BUSPIRONE HCL 5 MG PO TABS: 5.0000 mg | ORAL_TABLET | Freq: Every day | ORAL | 2 refills | Status: DC

## 2018-02-15 NOTE — Telephone Encounter (Signed)
Pt is calling in wants something for her nerves, she states when she hasnt drove her car in awhile, she gets nervous when cars come up behind her, and when she meets a car.

## 2018-02-15 NOTE — Telephone Encounter (Signed)
I would not recommend a 'nerve pill ' for her to take only when she is driving!, this will slow down her reaction time and ability to safely drive, she needs to reconsider driving and her  Ability to drive safely, I understand her anxiety, but a nerve pill for any  Driver is potentially very harmful I having chronic anxiety issues and wants to start low dose medication eVERY day, , buspar  5 mg once daily , she should let you know and send in 3 months only wiuth f/u arranged. Taking this regularly should reduce road anxiety and is safer if taken at same time every day

## 2018-02-15 NOTE — Telephone Encounter (Signed)
Patient aware and med sent  

## 2018-03-22 ENCOUNTER — Ambulatory Visit (INDEPENDENT_AMBULATORY_CARE_PROVIDER_SITE_OTHER): Payer: Medicare Other | Admitting: Family Medicine

## 2018-03-22 ENCOUNTER — Encounter: Payer: Self-pay | Admitting: Family Medicine

## 2018-03-22 VITALS — BP 128/78 | HR 67 | Resp 15 | Ht 63.0 in | Wt 164.0 lb

## 2018-03-22 DIAGNOSIS — M1711 Unilateral primary osteoarthritis, right knee: Secondary | ICD-10-CM

## 2018-03-22 DIAGNOSIS — E663 Overweight: Secondary | ICD-10-CM

## 2018-03-22 DIAGNOSIS — E782 Mixed hyperlipidemia: Secondary | ICD-10-CM | POA: Diagnosis not present

## 2018-03-22 DIAGNOSIS — I10 Essential (primary) hypertension: Secondary | ICD-10-CM

## 2018-03-22 NOTE — Patient Instructions (Addendum)
F/U in 4.5 months, call if you need me before   Labs today lipid cmp and eGFr , CBC  Excellent blood pressure , stay ojn same medication  90 day supply  of medications are being sent  Stay off of highways/ roads that make you anxious when  You drive  Thank you  for choosing [facility]. We consider it a privelige to serve you.  Delivering excellent health care in a caring and  compassionate way is our goal.  Partnering with you,  so that together we can achieve this goal is our strategy.

## 2018-03-23 ENCOUNTER — Telehealth: Payer: Self-pay

## 2018-03-23 LAB — CBC
HEMATOCRIT: 43.6 % (ref 35.0–45.0)
Hemoglobin: 14.9 g/dL (ref 11.7–15.5)
MCH: 30.3 pg (ref 27.0–33.0)
MCHC: 34.2 g/dL (ref 32.0–36.0)
MCV: 88.8 fL (ref 80.0–100.0)
MPV: 11.2 fL (ref 7.5–12.5)
Platelets: 257 10*3/uL (ref 140–400)
RBC: 4.91 10*6/uL (ref 3.80–5.10)
RDW: 14.5 % (ref 11.0–15.0)
WBC: 6.8 10*3/uL (ref 3.8–10.8)

## 2018-03-23 LAB — COMPLETE METABOLIC PANEL WITH GFR
AG Ratio: 1.4 (calc) (ref 1.0–2.5)
ALKALINE PHOSPHATASE (APISO): 44 U/L (ref 33–130)
ALT: 11 U/L (ref 6–29)
AST: 18 U/L (ref 10–35)
Albumin: 4.1 g/dL (ref 3.6–5.1)
BUN: 16 mg/dL (ref 7–25)
CHLORIDE: 103 mmol/L (ref 98–110)
CO2: 28 mmol/L (ref 20–32)
CREATININE: 0.69 mg/dL (ref 0.60–0.88)
Calcium: 10.7 mg/dL — ABNORMAL HIGH (ref 8.6–10.4)
GFR, Est African American: 93 mL/min/{1.73_m2} (ref >=60)
GFR, Est Non African American: 81 mL/min/{1.73_m2} (ref >=60)
GLUCOSE: 72 mg/dL (ref 65–99)
Globulin: 2.9 g/dL (calc) (ref 1.9–3.7)
Potassium: 4.3 mmol/L (ref 3.5–5.3)
SODIUM: 141 mmol/L (ref 135–146)
Total Bilirubin: 0.8 mg/dL (ref 0.2–1.2)
Total Protein: 7 g/dL (ref 6.1–8.1)

## 2018-03-23 LAB — LIPID PANEL
CHOL/HDL RATIO: 2.8 (calc) (ref <5.0)
Cholesterol: 180 mg/dL (ref <200)
HDL: 65 mg/dL (ref >50)
LDL CHOLESTEROL (CALC): 98 mg/dL
Non-HDL Cholesterol (Calc): 115 mg/dL (calc) (ref <130)
Triglycerides: 80 mg/dL (ref <150)

## 2018-03-23 NOTE — Telephone Encounter (Signed)
Lab ordered per MD 

## 2018-03-26 ENCOUNTER — Encounter: Payer: Self-pay | Admitting: *Deleted

## 2018-03-27 ENCOUNTER — Encounter: Payer: Self-pay | Admitting: Family Medicine

## 2018-03-27 NOTE — Assessment & Plan Note (Signed)
Hyperlipidemia:Low fat diet discussed and encouraged.   Lipid Panel  Lab Results  Component Value Date   CHOL 180 03/22/2018   HDL 65 03/22/2018   LDLCALC 98 03/22/2018   TRIG 80 03/22/2018   CHOLHDL 2.8 03/22/2018  Controlled, no change in medication     

## 2018-03-27 NOTE — Assessment & Plan Note (Signed)
Controlled, no change in medication DASH diet and commitment to daily physical activity for a minimum of 30 minutes discussed and encouraged, as a part of hypertension management. The importance of attaining a healthy weight is also discussed.  BP/Weight 03/22/2018 02/01/2018 11/18/2017 08/05/2017 05/18/2017 01/19/2017 03/28/2016  Systolic BP 128 141 122 110 120 120 114  Diastolic BP 78 73 78 68 80 64 57  Wt. (Lbs) 164 158 160 164.12 164 161.25 172  BMI 29.05 27.99 28.34 29.07 29.05 28.56 28.62

## 2018-03-27 NOTE — Assessment & Plan Note (Signed)
Not disabling, gets intra articular injections regularly Home safety reviewed

## 2018-03-27 NOTE — Assessment & Plan Note (Signed)
Deteriorated. Patient re-educated about  the importance of commitment to a  minimum of 150 minutes of exercise per week.  The importance of healthy food choices with portion control discussed. Encouraged to start a food diary, count calories and to consider  joining a support group. Sample diet sheets offered. Goals set by the patient for the next several months.   Weight /BMI 03/22/2018 02/01/2018 11/18/2017  WEIGHT 164 lb 158 lb 160 lb  HEIGHT 5\' 3"  5\' 3"  5\' 3"   BMI 29.05 kg/m2 27.99 kg/m2 28.34 kg/m2

## 2018-03-27 NOTE — Progress Notes (Signed)
Lori Stein     MRN: [id number]      DOB: 1932-11-24   HPI [name] is here for follow up and re-evaluation of chronic medical conditions, medication management and review of any available recent lab and radiology data.  Preventive health is updated, specifically  Cancer screening and Immunization.   Questions or concerns regarding consultations or procedures which the PT has had in the interim are  addressed. The PT denies any adverse reactions to current medications since the last visit.  C/o increased anxiety when driving on the highway , had been requesting xanx , I adviused that iT IS UNSAFE AND SHE NEEDS TO HAVE SOMEONE DRIVE HER INSTEad, where she feels anxious and  Uncomfortable, she readily agrees   ROS Denies recent fever or chills. Denies sinus pressure, nasal congestion, ear pain or sore throat. Denies chest congestion, productive cough or wheezing. Denies chest pains, palpitations and leg swelling Denies abdominal pain, nausea, vomiting,diarrhea or constipation.   Denies dysuria, frequency, hesitancy or incontinence. Denies  Uncontrolled  joint pain, swelling and limitation in mobility. Denies headaches, seizures, numbness, or tingling. Denies skin break down or rash.   PE  BP 128/78   Pulse 67   Resp 15   Ht 5\' 3"  (1.6 m)   Wt 164 lb (74.4 kg)   SpO2 95%   BMI 29.05 kg/m   Patient alert and oriented and in no cardiopulmonary distress.  HEENT: No facial asymmetry, EOMI,   oropharynx pink and moist.  Neck supple no JVD, no mass.  Chest: Clear to auscultation bilaterally.  CVS: S1, S2 no murmurs, no S3.Regular rate.  ABD: Soft non tender.   Ext: No edema  MS: Adequate though reduced ROM spine, shoulders, hips and knees.  Skin: Intact, no ulcerations or rash noted.  Psych: Good eye contact, normal affect. Memory intact not anxious or depressed appearing.  CNS: CN 2-12 intact, power,  normal throughout.no focal deficits noted.   Assessment &  Plan Essential hypertension Controlled, no change in medication DASH diet and commitment to daily physical activity for a minimum of 30 minutes discussed and encouraged, as a part of hypertension management. The importance of attaining a healthy weight is also discussed.  BP/Weight 03/22/2018 02/01/2018 11/18/2017 08/05/2017 05/18/2017 01/19/2017 03/28/2016  Systolic BP 128 141 122 110 120 120 114  Diastolic BP 78 73 78 68 80 64 57  Wt. (Lbs) 164 158 160 164.12 164 161.25 172  BMI 29.05 27.99 28.34 29.07 29.05 28.56 28.62       Hyperlipemia Hyperlipidemia:Low fat diet discussed and encouraged.   Lipid Panel  Lab Results  Component Value Date   CHOL 180 03/22/2018   HDL 65 03/22/2018   LDLCALC 98 03/22/2018   TRIG 80 03/22/2018   CHOLHDL 2.8 03/22/2018   Controlled, no change in medication     Overweight (BMI 25.0-29.9) Deteriorated. Patient re-educated about  the importance of commitment to a  minimum of 150 minutes of exercise per week.  The importance of healthy food choices with portion control discussed. Encouraged to start a food diary, count calories and to consider  joining a support group. Sample diet sheets offered. Goals set by the patient for the next several months.   Weight /BMI 03/22/2018 02/01/2018 11/18/2017  WEIGHT 164 lb 158 lb 160 lb  HEIGHT 5\' 3"  5\' 3"  5\' 3"   BMI 29.05 kg/m2 27.99 kg/m2 28.34 kg/m2      Osteoarthritis of right knee Not disabling, gets intra articular injections  regularly Home safety reviewed

## 2018-04-16 IMAGING — DX DG CHEST 2V
2 series · 2 of 2 positions shown · non-contrast
Comparison: 07/11/2010

CLINICAL DATA: Cough and congestion

EXAM:
CHEST  2 VIEW

[chest pa]
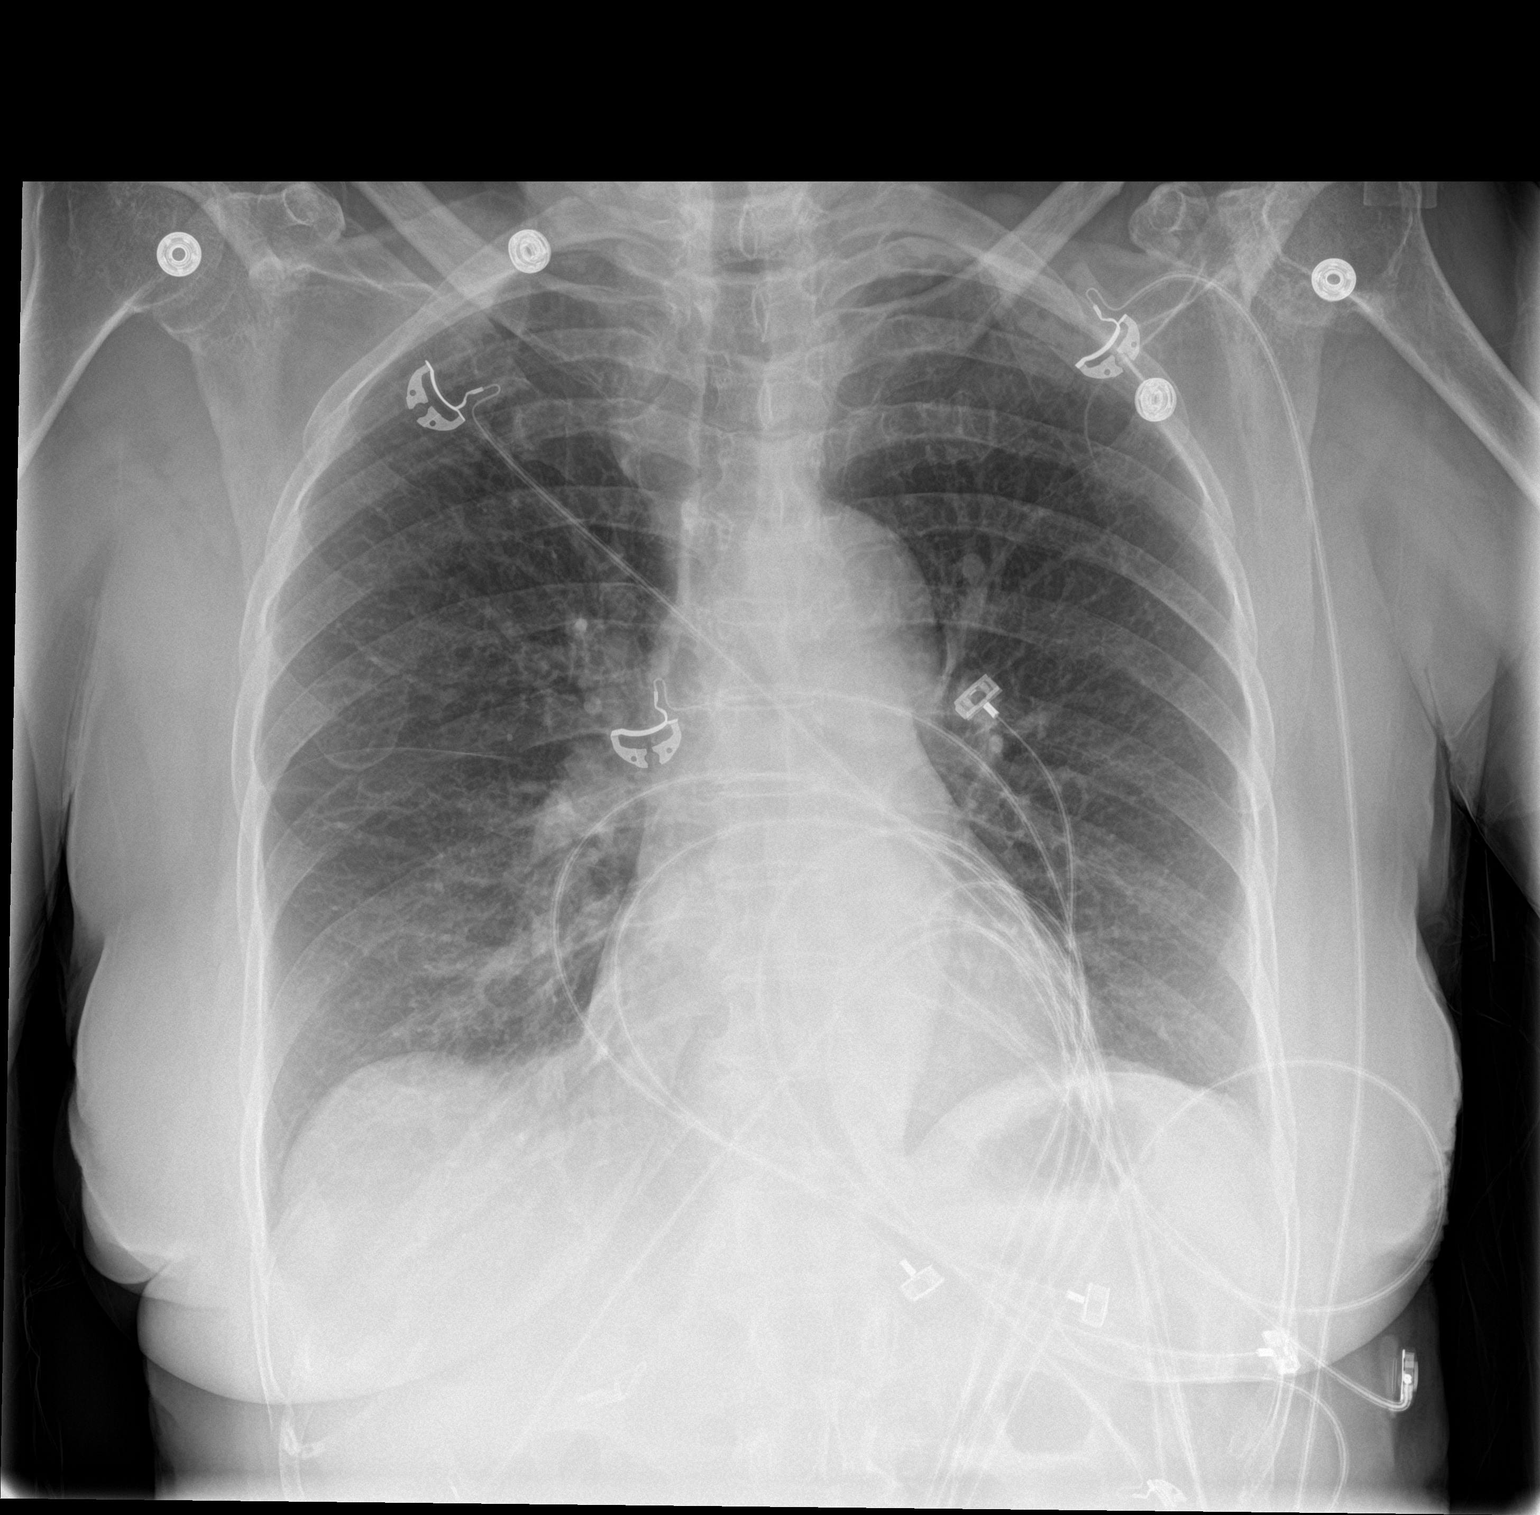

[chest lat]
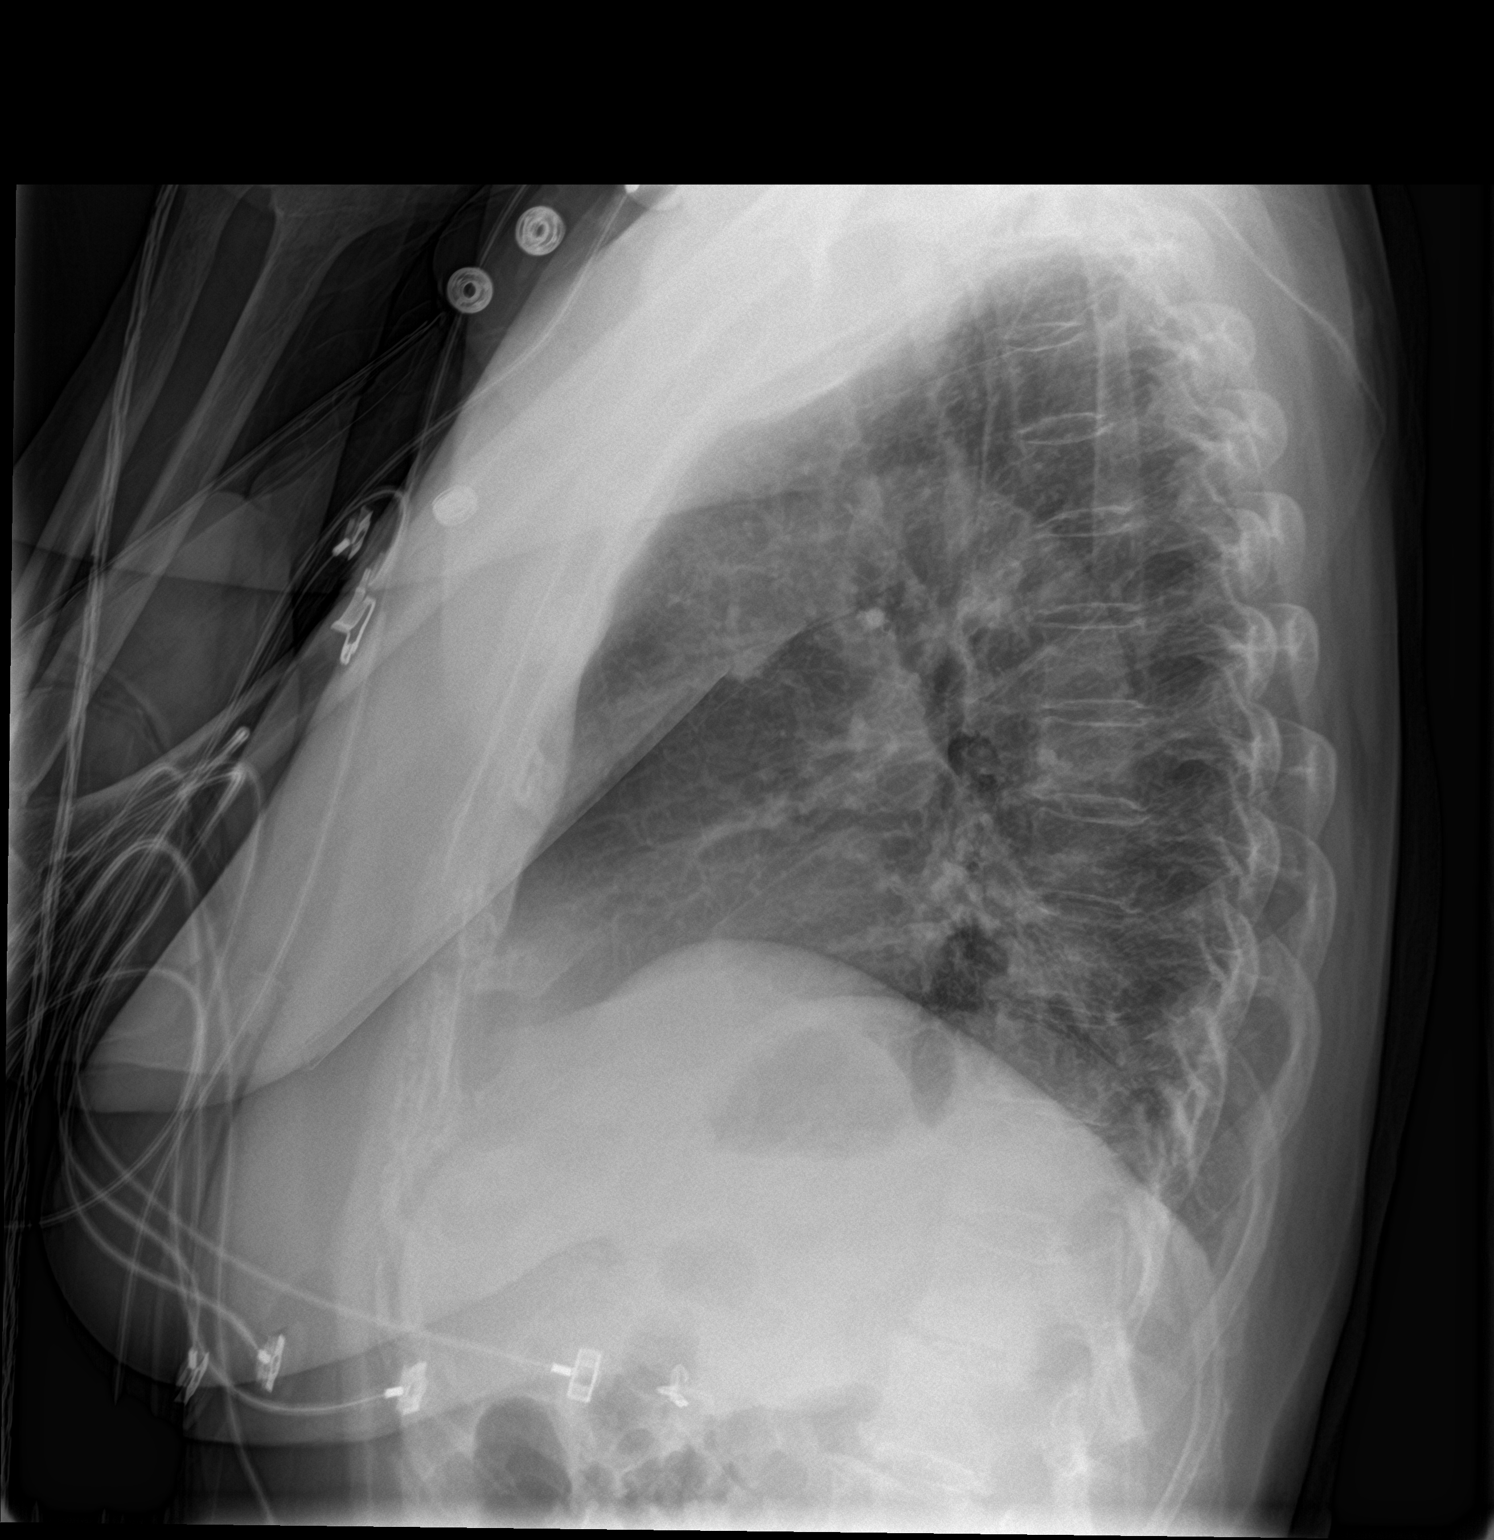

[2 of 2 positions shown; findings below may reference images not displayed]

FINDINGS: Normal mediastinum and cardiac silhouette. Normal pulmonary
vasculature. No evidence of effusion, infiltrate, or pneumothorax.
No acute bony abnormality. Degenerate changes of the LEFT shoulder.
IMPRESSION: No acute cardiopulmonary process.

## 2018-07-07 ENCOUNTER — Other Ambulatory Visit: Payer: Self-pay | Admitting: Family Medicine

## 2018-07-21 ENCOUNTER — Other Ambulatory Visit: Payer: Self-pay

## 2018-07-21 ENCOUNTER — Ambulatory Visit (INDEPENDENT_AMBULATORY_CARE_PROVIDER_SITE_OTHER): Payer: Medicare Other | Admitting: Family Medicine

## 2018-07-21 ENCOUNTER — Encounter: Payer: Self-pay | Admitting: Family Medicine

## 2018-07-21 ENCOUNTER — Encounter: Payer: Self-pay | Admitting: *Deleted

## 2018-07-21 VITALS — BP 128/78 | Ht 63.0 in | Wt 175.0 lb

## 2018-07-21 DIAGNOSIS — Z1231 Encounter for screening mammogram for malignant neoplasm of breast: Secondary | ICD-10-CM

## 2018-07-21 DIAGNOSIS — I10 Essential (primary) hypertension: Secondary | ICD-10-CM | POA: Diagnosis not present

## 2018-07-21 DIAGNOSIS — M1711 Unilateral primary osteoarthritis, right knee: Secondary | ICD-10-CM

## 2018-07-21 DIAGNOSIS — E782 Mixed hyperlipidemia: Secondary | ICD-10-CM

## 2018-07-21 NOTE — Progress Notes (Signed)
Virtual Visit via Telephone Note  I connected with Ninfa Linden on 07/21/18 at 11:00 AM EDT by telephone and verified that I am speaking with the correct person using two identifiers.  Location: Patient: home  Provider: office   I discussed the limitations, risks, security and privacy concerns of performing an evaluation and management service by telephone and the availability of in person appointments. I also discussed with the patient that there may be a patient responsible charge related to this service. The patient expressed understanding and agreed to proceed.   History of Present Illness: Denies recent fever or chills. Denies sinus pressure, nasal congestion, ear pain or sore throat. Denies chest congestion, productive cough or wheezing. Denies chest pains, palpitations and leg swelling Denies abdominal pain, nausea, vomiting,diarrhea or constipation.   Denies dysuria, frequency, hesitancy or incontinence. Denies uncontrolled  joint pain, she does have knee swelling and limitation in mobility. Denies headaches, seizures, numbness, or tingling. Denies depression, anxiety or insomnia. Denies skin break down or rash.       Observations/Objective: BP 128/78   Ht 5\' 3"  (1.6 m)   Wt 175 lb (79.4 kg)   BMI 31.00 kg/m  Good communication with no confusion and intact memory. Alert and oriented x 3 No signs of respiratory distress during sppech    Assessment and Plan: Essential hypertension Controlled, no change in medication DASH diet and commitment to daily physical activity for a minimum of 30 minutes discussed and encouraged, as a part of hypertension management. The importance of attaining a healthy weight is also discussed.  BP/Weight 07/21/2018 03/22/2018 02/01/2018 11/18/2017 08/05/2017 05/18/2017 01/19/2017  Systolic BP 128 128 141 122 110 120 120  Diastolic BP 78 78 73 78 68 80 64  Wt. (Lbs) 175 164 158 160 164.12 164 161.25  BMI 31 29.05 27.99 28.34 29.07 29.05  28.56       Hyperlipemia Hyperlipidemia:Low fat diet discussed and encouraged.   Lipid Panel  Lab Results  Component Value Date   CHOL 180 03/22/2018   HDL 65 03/22/2018   LDLCALC 98 03/22/2018   TRIG 80 03/22/2018   CHOLHDL 2.8 03/22/2018  Controlled, no change in medication      Osteoarthritis of right knee Reports doing well as far as pain and instability are concerned, uses tylenol as needed    Follow Up Instructions:    I discussed the assessment and treatment plan with the patient. The patient was provided an opportunity to ask questions and all were answered. The patient agreed with the plan and demonstrated an understanding of the instructions.   The patient was advised to call back or seek an in-person evaluation if the symptoms worsen or if the condition fails to improve as anticipated.  I provided 22 minutes of non-face-to-face time during this encounter.   Syliva Overman, MD

## 2018-07-24 ENCOUNTER — Encounter: Payer: Self-pay | Admitting: Family Medicine

## 2018-07-24 NOTE — Assessment & Plan Note (Signed)
Reports doing well as far as pain and instability are concerned, uses tylenol as needed

## 2018-07-24 NOTE — Assessment & Plan Note (Signed)
Hyperlipidemia:Low fat diet discussed and encouraged.   Lipid Panel  Lab Results  Component Value Date   CHOL 180 03/22/2018   HDL 65 03/22/2018   LDLCALC 98 03/22/2018   TRIG 80 03/22/2018   CHOLHDL 2.8 03/22/2018  Controlled, no change in medication

## 2018-07-24 NOTE — Patient Instructions (Addendum)
F./U with MD in 5 months, call if you need me sooner  Please keep wellness appointment , scheduled in November  Mammogram is being scheduled and appointment information sent  Fasting lipid, cmp and EGFr 1 week before MD follow up  Be careful not to fall, and keep home environment clutter  free and well lit  Thankful you are doing well Social distancing. Frequent hand washing with soap and water Keeping your hands off of your face.Wear a mask when you go out and also keep a 6 ft distance These 3 practices will help to keep both you and your community healthy during this time. Please practice them faithfully!  Thanks for choosing [facility], we consider it a privelige to serve you.

## 2018-07-24 NOTE — Assessment & Plan Note (Signed)
Controlled, no change in medication DASH diet and commitment to daily physical activity for a minimum of 30 minutes discussed and encouraged, as a part of hypertension management. The importance of attaining a healthy weight is also discussed.  BP/Weight 07/21/2018 03/22/2018 02/01/2018 11/18/2017 08/05/2017 05/18/2017 01/19/2017  Systolic BP 128 128 141 122 110 120 120  Diastolic BP 78 78 73 78 68 80 64  Wt. (Lbs) 175 164 158 160 164.12 164 161.25  BMI 31 29.05 27.99 28.34 29.07 29.05 28.56

## 2018-07-26 NOTE — Addendum Note (Signed)
Addended by: Recardo Evangelist A on: 07/26/2018 08:04 AM   Modules accepted: Orders

## 2018-07-28 ENCOUNTER — Other Ambulatory Visit: Payer: Self-pay | Admitting: Family Medicine

## 2018-08-02 ENCOUNTER — Ambulatory Visit (HOSPITAL_COMMUNITY): Payer: Medicare Other

## 2018-10-07 ENCOUNTER — Other Ambulatory Visit: Payer: Self-pay | Admitting: Family Medicine

## 2018-12-14 ENCOUNTER — Other Ambulatory Visit: Payer: Self-pay

## 2018-12-14 ENCOUNTER — Ambulatory Visit (INDEPENDENT_AMBULATORY_CARE_PROVIDER_SITE_OTHER): Payer: Medicare Other | Admitting: Family Medicine

## 2018-12-14 ENCOUNTER — Encounter: Payer: Self-pay | Admitting: Family Medicine

## 2018-12-14 VITALS — BP 110/70 | HR 72 | Resp 14 | Ht 63.0 in | Wt 163.0 lb

## 2018-12-14 DIAGNOSIS — R12 Heartburn: Secondary | ICD-10-CM

## 2018-12-14 DIAGNOSIS — I10 Essential (primary) hypertension: Secondary | ICD-10-CM

## 2018-12-14 DIAGNOSIS — E559 Vitamin D deficiency, unspecified: Secondary | ICD-10-CM | POA: Diagnosis not present

## 2018-12-14 DIAGNOSIS — Z23 Encounter for immunization: Secondary | ICD-10-CM | POA: Diagnosis not present

## 2018-12-14 DIAGNOSIS — Z780 Asymptomatic menopausal state: Secondary | ICD-10-CM | POA: Diagnosis not present

## 2018-12-14 DIAGNOSIS — Z1231 Encounter for screening mammogram for malignant neoplasm of breast: Secondary | ICD-10-CM

## 2018-12-14 DIAGNOSIS — M1711 Unilateral primary osteoarthritis, right knee: Secondary | ICD-10-CM | POA: Diagnosis not present

## 2018-12-14 DIAGNOSIS — E663 Overweight: Secondary | ICD-10-CM | POA: Diagnosis not present

## 2018-12-14 DIAGNOSIS — E782 Mixed hyperlipidemia: Secondary | ICD-10-CM | POA: Diagnosis not present

## 2018-12-14 MED ORDER — FAMOTIDINE 40 MG PO TABS: ORAL_TABLET | ORAL | 1 refills | Status: DC

## 2018-12-14 NOTE — Patient Instructions (Addendum)
F/U with MD in early March , call if you need me before  Flu vaccine today  Fasting lipid,cmp and EGFr, TSH and vit D today  Please schedule mammogram and dexa on same day next March when mammogram is due   Take Pepcid once daily on the days that you have indigestion and avoid foods that cause this  Continue to try too protect yourself as much as possible  Thanks for choosing [facility], we consider it a privelige to serve you.

## 2018-12-15 ENCOUNTER — Encounter: Payer: Self-pay | Admitting: Family Medicine

## 2018-12-15 DIAGNOSIS — R12 Heartburn: Secondary | ICD-10-CM | POA: Insufficient documentation

## 2018-12-15 LAB — COMPLETE METABOLIC PANEL WITH GFR
AG Ratio: 1.4 (calc) (ref 1.0–2.5)
ALT: 16 U/L (ref 6–29)
AST: 16 U/L (ref 10–35)
Albumin: 4.2 g/dL (ref 3.6–5.1)
Alkaline phosphatase (APISO): 43 U/L (ref 37–153)
BUN: 24 mg/dL (ref 7–25)
CO2: 29 mmol/L (ref 20–32)
Calcium: 10.8 mg/dL — ABNORMAL HIGH (ref 8.6–10.4)
Chloride: 103 mmol/L (ref 98–110)
Creat: 0.79 mg/dL (ref 0.60–0.88)
GFR, Est African American: 80 mL/min/{1.73_m2} (ref >=60)
GFR, Est Non African American: 69 mL/min/{1.73_m2} (ref >=60)
Globulin: 3 g/dL (calc) (ref 1.9–3.7)
Glucose, Bld: 91 mg/dL (ref 65–99)
Potassium: 3.9 mmol/L (ref 3.5–5.3)
Sodium: 141 mmol/L (ref 135–146)
Total Bilirubin: 0.8 mg/dL (ref 0.2–1.2)
Total Protein: 7.2 g/dL (ref 6.1–8.1)

## 2018-12-15 LAB — LIPID PANEL
Cholesterol: 185 mg/dL (ref <200)
HDL: 61 mg/dL (ref >=50)
LDL Cholesterol (Calc): 108 mg/dL (calc) — ABNORMAL HIGH
Non-HDL Cholesterol (Calc): 124 mg/dL (calc) (ref <130)
Total CHOL/HDL Ratio: 3 (calc) (ref <5.0)
Triglycerides: 75 mg/dL (ref <150)

## 2018-12-15 LAB — VITAMIN D 25 HYDROXY (VIT D DEFICIENCY, FRACTURES): Vit D, 25-Hydroxy: 39 ng/mL (ref 30–100)

## 2018-12-15 LAB — TSH: TSH: 3.51 mIU/L (ref 0.40–4.50)

## 2018-12-15 NOTE — Progress Notes (Signed)
Lori Stein     MRN: [id number]      DOB: [birth year]   HPI Lori Stein is here for follow up and re-evaluation of chronic medical conditions, medication management and review of any available recent lab and radiology data.  Preventive health is updated, specifically  Cancer screening and Immunization.   Questions or concerns regarding consultations or procedures which the PT has had in the interim are  addressed. The PT denies any adverse reactions to current medications since the last visit.  C/o intermittent dyspepsia and heartburn after eating certain foods  ROS Denies recent fever or chills. Denies sinus pressure, nasal congestion, ear pain or sore throat. Denies chest congestion, productive cough or wheezing. Denies chest pains, palpitations and leg swelling Denies abdominal pain, nausea, vomiting,diarrhea or constipation.   Denies dysuria, frequency, hesitancy or incontinence.  Denies headaches, seizures, numbness, or tingling.  Denies skin break down or rash.   PE  BP 110/70   Pulse 72   Resp 14   Ht 5\' 3"  (1.6 m)   Wt 163 lb (73.9 kg)   BMI 28.87 kg/m   Patient alert and oriented and in no cardiopulmonary distress.  HEENT: No facial asymmetry, EOMI,   oropharynx pink and moist.  Neck supple no JVD, no mass.  Chest: Clear to auscultation bilaterally.  CVS: S1, S2 no murmurs, no S3.Regular rate.  ABD: Soft non tender.   Ext: No edema  MS: decreased  ROM spine, shoulders, hips and knees.  Skin: Intact, no ulcerations or rash noted.  Psych: Good eye contact, normal affect.  not anxious or depressed appearing.  CNS: CN 2-12 intact, power,  normal throughout.no focal deficits noted.   Assessment & Plan  Essential hypertension Controlled, no change in medication DASH diet and commitment to daily physical activity for a minimum of 30 minutes discussed and encouraged, as a part of hypertension management. The importance of attaining a healthy weight  is also discussed.  BP/Weight 12/14/2018 07/21/2018 03/22/2018 02/01/2018 11/18/2017 0/86/5784 08/23/6293  Systolic BP 284 132 440 102 725 366 440  Diastolic BP 70 78 78 73 78 68 80  Wt. (Lbs) 163 175 164 158 160 164.12 164  BMI 28.87 31 29.05 27.99 28.34 29.07 29.05       Hyperlipemia Hyperlipidemia:Low fat diet discussed and encouraged.   Lipid Panel  Lab Results  Component Value Date   CHOL 185 12/14/2018   HDL 61 12/14/2018   LDLCALC 108 (H) 12/14/2018   TRIG 75 12/14/2018   CHOLHDL 3.0 12/14/2018   No med change, needs to reduce fat intake   Overweight (BMI 25.0-29.9)  Patient re-educated about  the importance of commitment to a  minimum of 150 minutes of exercise per week as able.  The importance of healthy food choices with portion control discussed, as well as eating regularly and within a 12 hour window most days. The need to choose "clean , green" food 50 to 75% of the time is discussed, as well as to make water the primary drink and set a goal of 64 ounces water daily.    Weight /BMI 12/14/2018 07/21/2018 03/22/2018  WEIGHT 163 lb 175 lb 164 lb  HEIGHT 5\' 3"  5\' 3"  5\' 3"   BMI 28.87 kg/m2 31 kg/m2 29.05 kg/m2      Osteoarthritis of right knee Chronic and unchanged, no falls, intermittent pain flares managed by tylenol  Heartburn Intermittent heartburn aggravated by certain foods, advised to avoid foods that aggravate symptoms and use oTC  pepcid as needed

## 2018-12-15 NOTE — Assessment & Plan Note (Signed)
  Patient re-educated about  the importance of commitment to a  minimum of 150 minutes of exercise per week as able.  The importance of healthy food choices with portion control discussed, as well as eating regularly and within a 12 hour window most days. The need to choose "clean , green" food 50 to 75% of the time is discussed, as well as to make water the primary drink and set a goal of 64 ounces water daily.    Weight /BMI 12/14/2018 07/21/2018 03/22/2018  WEIGHT 163 lb 175 lb 164 lb  HEIGHT 5\' 3"  5\' 3"  5\' 3"   BMI 28.87 kg/m2 31 kg/m2 29.05 kg/m2

## 2018-12-15 NOTE — Assessment & Plan Note (Signed)
Controlled, no change in medication DASH diet and commitment to daily physical activity for a minimum of 30 minutes discussed and encouraged, as a part of hypertension management. The importance of attaining a healthy weight is also discussed.  BP/Weight 12/14/2018 07/21/2018 03/22/2018 02/01/2018 11/18/2017 8/56/3149 7/0/2637  Systolic BP 858 850 277 412 878 676 720  Diastolic BP 70 78 78 73 78 68 80  Wt. (Lbs) 163 175 164 158 160 164.12 164  BMI 28.87 31 29.05 27.99 28.34 29.07 29.05

## 2018-12-15 NOTE — Assessment & Plan Note (Signed)
Intermittent heartburn aggravated by certain foods, advised to avoid foods that aggravate symptoms and use oTC pepcid as needed

## 2018-12-15 NOTE — Assessment & Plan Note (Signed)
Chronic and unchanged, no falls, intermittent pain flares managed by tylenol

## 2018-12-15 NOTE — Assessment & Plan Note (Signed)
Hyperlipidemia:Low fat diet discussed and encouraged.   Lipid Panel  Lab Results  Component Value Date   CHOL 185 12/14/2018   HDL 61 12/14/2018   LDLCALC 108 (H) 12/14/2018   TRIG 75 12/14/2018   CHOLHDL 3.0 12/14/2018   No med change, needs to reduce fat intake

## 2018-12-30 ENCOUNTER — Ambulatory Visit: Payer: Medicare Other | Admitting: Family Medicine

## 2019-01-04 ENCOUNTER — Telehealth: Payer: Self-pay | Admitting: Family Medicine

## 2019-01-04 NOTE — Telephone Encounter (Signed)
Spoke with patient and she states the back pain isn't bad enough for her to come in. She thinks she turned wrong. Advised her to alternate heat and ice and told her to take 1 or 2 tablets of Tylenol ES but to start with 1 and see how that does for her. Encouraged her to call back if her pain didn't get better within the next couple of days with verbal understanding.

## 2019-01-04 NOTE — Telephone Encounter (Signed)
Wants to speak with a nurse regarding Back pain

## 2019-02-01 ENCOUNTER — Other Ambulatory Visit: Payer: Self-pay

## 2019-02-01 MED ORDER — HYDROCHLOROTHIAZIDE 25 MG PO TABS: 25.0000 mg | ORAL_TABLET | Freq: Every day | ORAL | 1 refills | Status: DC

## 2019-02-03 ENCOUNTER — Other Ambulatory Visit: Payer: Self-pay

## 2019-02-03 ENCOUNTER — Ambulatory Visit: Payer: Medicare Other

## 2019-02-03 ENCOUNTER — Encounter: Payer: Self-pay | Admitting: Family Medicine

## 2019-02-03 ENCOUNTER — Ambulatory Visit (INDEPENDENT_AMBULATORY_CARE_PROVIDER_SITE_OTHER): Payer: Medicare Other | Admitting: Family Medicine

## 2019-02-03 VITALS — BP 110/70 | HR 72 | Resp 14 | Ht 63.0 in | Wt 163.0 lb

## 2019-02-03 DIAGNOSIS — Z0000 Encounter for general adult medical examination without abnormal findings: Secondary | ICD-10-CM

## 2019-02-03 NOTE — Progress Notes (Signed)
Subjective:   Lori Stein is a 85 y.o. female who presents for Medicare Annual (Subsequent) preventive examination.  Location of Patient: Home Location of Provider: Telehealth Consent was obtain for visit to be over via telehealth.   I verified that I am speaking with the correct person using two identifiers.    Review of Systems:    Cardiac Risk Factors include: advanced age (>27men, >22 women);dyslipidemia;hypertension     Objective:     Vitals: BP 110/70   Pulse 72   Resp 14   Ht 5\' 3"  (1.6 m)   Wt 163 lb (73.9 kg)   BMI 28.87 kg/m   Body mass index is 28.87 kg/m.  Advanced Directives 02/01/2018 03/28/2016  Does Patient Have a Medical Advance Directive? Yes Yes  Type of Advance Directive Living will Healthcare Power of Attorney  Does patient want to make changes to medical advance directive? No - Patient declined -  Copy of Healthcare Power of Attorney in Chart? 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 Back pain   . Hyperlipidemia   . Hypertension   . OA (osteoarthritis) of knee   . Osteophyte, right knee 05/21/2017   Past Surgical History:  Procedure Laterality Date  . Bilateral tubal ligation    . CHOLECYSTECTOMY  2011  . JOINT REPLACEMENT  09/2010   left knee  . removal of mole from left neck  2004   Family History   Problem Relation Age of Onset  . Aneurysm Mother        AAA  . Heart attack Father   . Hypertension Father   . Diabetes Sister   . Diabetes Brother   . Hypertension Sister    Social History   Socioeconomic History  . Marital status: Widowed    Spouse name: Not on file  . Number of children: 3  . Years of education: 31  . Highest education level: Some college, no degree  Occupational History  . Occupation: Retired  14  . Financial resource strain: Somewhat hard  . Food insecurity    Worry: Never true    Inability: Never true  . Transportation needs    Medical: No    Non-medical: No  Tobacco Use  . Smoking status: Never Smoker  . Smokeless tobacco: Never Used  Substance and Sexual Activity  . Alcohol use: No  . Drug use: No  . Sexual activity: Not Currently  Lifestyle  . Physical activity    Days per week: 0 days    Minutes per session: 0 min  . Stress: Only a little  Relationships  . Social connections    Talks on phone: More than three times a week    Gets together: More than three times a week    Attends religious service: More than  4 times per year    Active member of club or organization: Yes    Attends meetings of clubs or organizations: More than 4 times per year    Relationship status: Widowed  Other Topics Concern  . Not on file  Social History Narrative   Lives alone, sister sometimes comes and stays with her     Outpatient Encounter Medications as of 02/03/2019  Medication Sig  . acetaminophen (ARTHRITIS PAIN RELIEF) 650 MG CR tablet Take 1 tablet (650 mg total) by mouth 2 (two) times daily.  Marland Kitchen. aspirin (ASPIRIN LOW DOSE) 81 MG EC tablet Take 81 mg by mouth daily.    . famotidine (PEPCID) 40 MG tablet Take one tablet by mouth once daily, as needed, for indigestion  . hydrochlorothiazide (HYDRODIURIL) 25 MG tablet Take 1 tablet (25 mg total) by mouth daily.  . Omega-3 Fatty Acids (FISH OIL) 1000 MG CAPS Take by mouth. Take 3 caps by mouth  daily     . potassium chloride (K-DUR,KLOR-CON) 10 MEQ tablet TAKE 1 TABLET BY MOUTH TWICE DAILY  . simvastatin (ZOCOR) 10 MG tablet TAKE 1 TABLET BY MOUTH AT BEDTIME FOR CHOLESTEROL   No facility-administered encounter medications on file as of 02/03/2019.     Activities of Daily Living In your present state of health, do you have any difficulty performing the following activities: 02/03/2019  Hearing? N  Vision? N  Difficulty concentrating or making decisions? N  Walking or climbing stairs? N  Dressing or bathing? N  Doing errands, shopping? N  Preparing Food and eating ? N  Using the Toilet? N  In the past six months, have you accidently leaked urine? N  Do you have problems with loss of bowel control? N  Managing your Medications? N  Managing your Finances? N  Housekeeping or managing your Housekeeping? N  Some recent data might be hidden    Patient Care Team: Kerri PerchesSimpson, Lizbet E, MD as PCP - General    Assessment:   This is a routine wellness examination for [facility].  Exercise Activities and Dietary recommendations Current Exercise Habits: The patient does not participate in regular exercise at present, Exercise limited by: None identified  Goals    . Exercise 150 minutes per week (moderate activity)    . Prevent Falls       Fall Risk Fall Risk  02/03/2019 07/21/2018 03/22/2018 02/01/2018 11/18/2017  Falls in the past year? 0 0 0 0 No  Number falls in past yr: 0 - - - -  Injury with Fall? 0 0 - - -  Risk for fall due to : - - - Impaired mobility -  Follow up - - - Falls prevention discussed -   Is the patient's home free of loose throw rugs in walkways, pet beds, electrical cords, etc?   yes      Grab bars in the bathroom? yes      Handrails on the stairs?   yes      Adequate lighting?   yes    Depression Screen PHQ 2/9 Scores 02/03/2019 07/21/2018 03/22/2018 02/01/2018  PHQ - 2 Score 1 0 0 0  PHQ- 9 Score - - 1 -     Cognitive Function     6CIT Screen  02/03/2019 02/01/2018 01/19/2017  What Year? 0 points 0 points 0 points  What month? 0 points 0 points 0 points  What time? 0 points 0 points 0 points  Count back from 20 0 points 0 points 0  points  Months in reverse 0 points 0 points 0 points  Repeat phrase 0 points 0 points 6 points  Total Score 0 0 6    Immunization History  Administered Date(s) Administered  . Fluad Quad(high Dose 65+) 12/14/2018  . Influenza Split 11/20/2011  . Influenza Whole 05/12/2007, 01/03/2009, 02/19/2010, 12/11/2010  . Influenza,inj,Quad PF,6+ Mos 12/27/2012, 01/13/2014, 12/18/2014, 03/26/2016, 01/19/2017, 11/30/2017  . Pneumococcal Conjugate-13 10/20/2013  . Pneumococcal Polysaccharide-23 11/23/2003  . Td 11/23/2003    Qualifies for Shingles Vaccine? declined  Screening Tests Health Maintenance  Topic Date Due  . DEXA SCAN  10/22/1999  . TETANUS/TDAP  12/14/2019 (Originally 11/22/2013)  . INFLUENZA VACCINE  Completed  . PNA vac Low Risk Adult  Completed    Cancer Screenings: Lung: Low Dose CT Chest recommended if Age 35-80 years, 30 pack-year currently smoking OR have quit w/in 15years. Patient does not qualify. Breast:  Up to date on Mammogram? Yes   Up to date of Bone Density/Dexa? No Colorectal:  No longer needed  Additional Screenings: : Hepatitis C Screening: n/a      Plan:       1. Encounter for Medicare annual wellness exam  I have personally reviewed and noted the following in the patient's chart:   . Medical and social history . Use of alcohol, tobacco or illicit drugs  . Current medications and supplements . Functional ability and status . Nutritional status . Physical activity . Advanced directives . List of other physicians . Hospitalizations, surgeries, and ER visits in previous 12 months . Vitals . Screenings to include cognitive, depression, and falls . Referrals and appointments  In addition, I have reviewed and discussed with patient certain preventive  protocols, quality metrics, and best practice recommendations. A written personalized care plan for preventive services as well as general preventive health recommendations were provided to patient.     I provided 20 minutes of non-face-to-face time during this encounter.   Perlie Mayo, NP  02/03/2019

## 2019-02-03 NOTE — Patient Instructions (Signed)
Lori Stein , Thank you for taking time to come for your Medicare Wellness Visit. I appreciate your ongoing commitment to your health goals. Please review the following plan we discussed and let me know if I can assist you in the future.   Please continue to practice social distancing to keep you, your family, and our community safe.  If you must go out, please wear a Mask and practice good handwashing.  We have that you have a safe, healthy, happy Holiday season! See you in the New Year!  Screening recommendations/referrals: Colonoscopy:  No longer applicable Mammogram:  No longer applicable Bone Density:  Reports enough calcium vitamin D Recommended yearly ophthalmology/optometry visit for glaucoma screening and checkup Recommended yearly dental visit for hygiene and checkup  Vaccinations: Influenza vaccine: up to date  Pneumococcal vaccine: up to date  Tdap vaccine: up to date  Shingles vaccine:  Declined    Advanced directives: in place   Conditions/risks identified: advanced age, hypertension, arthritis   Next appointment: 05/17/2019   Preventive Care 11 Years and Older, Female Preventive care refers to lifestyle choices and visits with your health care provider that can promote health and wellness. What does preventive care include?  A yearly physical exam. This is also called an annual well check.  Dental exams once or twice a year.  Routine eye exams. Ask your health care provider how often you should have your eyes checked.  Personal lifestyle choices, including:  Daily care of your teeth and gums.  Regular physical activity.  Eating a healthy diet.  Avoiding tobacco and drug use.  Limiting alcohol use.  Practicing safe sex.  Taking low-dose aspirin every day.  Taking vitamin and mineral supplements as recommended by your health care provider. What happens during an annual well check? The services and screenings done by your health care provider  during your annual well check will depend on your age, overall health, lifestyle risk factors, and family history of disease. Counseling  Your health care provider may ask you questions about your:  Alcohol use.  Tobacco use.  Drug use.  Emotional well-being.  Home and relationship well-being.  Sexual activity.  Eating habits.  History of falls.  Memory and ability to understand (cognition).  Work and work Statistician.  Reproductive health. Screening  You may have the following tests or measurements:  Height, weight, and BMI.  Blood pressure.  Lipid and cholesterol levels. These may be checked every 5 years, or more frequently if you are over 68 years old.  Skin check.  Lung cancer screening. You may have this screening every year starting at age 67 if you have a 30-pack-year history of smoking and currently smoke or have quit within the past 15 years.  Fecal occult blood test (FOBT) of the stool. You may have this test every year starting at age 25.  Flexible sigmoidoscopy or colonoscopy. You may have a sigmoidoscopy every 5 years or a colonoscopy every 10 years starting at age 71.  Hepatitis C blood test.  Hepatitis B blood test.  Sexually transmitted disease (STD) testing.  Diabetes screening. This is done by checking your blood sugar (glucose) after you have not eaten for a while (fasting). You may have this done every 1-3 years.  Bone density scan. This is done to screen for osteoporosis. You may have this done starting at age 24.  Mammogram. This may be done every 1-2 years. Talk to your health care provider about how often you should have regular mammograms.  Talk with your health care provider about your test results, treatment options, and if necessary, the need for more tests. Vaccines  Your health care provider may recommend certain vaccines, such as:  Influenza vaccine. This is recommended every year.  Tetanus, diphtheria, and acellular pertussis  (Tdap, Td) vaccine. You may need a Td booster every 10 years.  Zoster vaccine. You may need this after age 21.  Pneumococcal 13-valent conjugate (PCV13) vaccine. One dose is recommended after age 67.  Pneumococcal polysaccharide (PPSV23) vaccine. One dose is recommended after age 2. Talk to your health care provider about which screenings and vaccines you need and how often you need them. This information is not intended to replace advice given to you by your health care provider. Make sure you discuss any questions you have with your health care provider. Document Released: 03/30/2015 Document Revised: 11/21/2015 Document Reviewed: 01/02/2015 Elsevier Interactive Patient Education  2017 [place] Prevention in the Home Falls can cause injuries. They can happen to people of all ages. There are many things you can do to make your home safe and to help prevent falls. What can I do on the outside of my home?  Regularly fix the edges of walkways and driveways and fix any cracks.  Remove anything that might make you trip as you walk through a door, such as a raised step or threshold.  Trim any bushes or trees on the path to your home.  Use bright outdoor lighting.  Clear any walking paths of anything that might make someone trip, such as rocks or tools.  Regularly check to see if handrails are loose or broken. Make sure that both sides of any steps have handrails.  Any raised decks and porches should have guardrails on the edges.  Have any leaves, snow, or ice cleared regularly.  Use sand or salt on walking paths during winter.  Clean up any spills in your garage right away. This includes oil or grease spills. What can I do in the bathroom?  Use night lights.  Install grab bars by the toilet and in the tub and shower. Do not use towel bars as grab bars.  Use non-skid mats or decals in the tub or shower.  If you need to sit down in the shower, use a plastic, non-slip  stool.  Keep the floor dry. Clean up any water that spills on the floor as soon as it happens.  Remove soap buildup in the tub or shower regularly.  Attach bath mats securely with double-sided non-slip rug tape.  Do not have throw rugs and other things on the floor that can make you trip. What can I do in the bedroom?  Use night lights.  Make sure that you have a light by your bed that is easy to reach.  Do not use any sheets or blankets that are too big for your bed. They should not hang down onto the floor.  Have a firm chair that has side arms. You can use this for support while you get dressed.  Do not have throw rugs and other things on the floor that can make you trip. What can I do in the kitchen?  Clean up any spills right away.  Avoid walking on wet floors.  Keep items that you use a lot in easy-to-reach places.  If you need to reach something above you, use a strong step stool that has a grab bar.  Keep electrical cords out of the way.  Do not use floor polish or wax that makes floors slippery. If you must use wax, use non-skid floor wax.  Do not have throw rugs and other things on the floor that can make you trip. What can I do with my stairs?  Do not leave any items on the stairs.  Make sure that there are handrails on both sides of the stairs and use them. Fix handrails that are broken or loose. Make sure that handrails are as long as the stairways.  Check any carpeting to make sure that it is firmly attached to the stairs. Fix any carpet that is loose or worn.  Avoid having throw rugs at the top or bottom of the stairs. If you do have throw rugs, attach them to the floor with carpet tape.  Make sure that you have a light switch at the top of the stairs and the bottom of the stairs. If you do not have them, ask someone to add them for you. What else can I do to help prevent falls?  Wear shoes that:  Do not have high heels.  Have rubber bottoms.  Are  comfortable and fit you well.  Are closed at the toe. Do not wear sandals.  If you use a stepladder:  Make sure that it is fully opened. Do not climb a closed stepladder.  Make sure that both sides of the stepladder are locked into place.  Ask someone to hold it for you, if possible.  Clearly mark and make sure that you can see:  Any grab bars or handrails.  First and last steps.  Where the edge of each step is.  Use tools that help you move around (mobility aids) if they are needed. These include:  Canes.  Walkers.  Scooters.  Crutches.  Turn on the lights when you go into a dark area. Replace any light bulbs as soon as they burn out.  Set up your furniture so you have a clear path. Avoid moving your furniture around.  If any of your floors are uneven, fix them.  If there are any pets around you, be aware of where they are.  Review your medicines with your doctor. Some medicines can make you feel dizzy. This can increase your chance of falling. Ask your doctor what other things that you can do to help prevent falls. This information is not intended to replace advice given to you by your health care provider. Make sure you discuss any questions you have with your health care provider. Document Released: 12/28/2008 Document Revised: 08/09/2015 Document Reviewed: 04/07/2014 Elsevier Interactive Patient Education  2017 [employer].

## 2019-02-21 ENCOUNTER — Ambulatory Visit: Payer: Medicare Other

## 2019-03-28 ENCOUNTER — Other Ambulatory Visit: Payer: Self-pay | Admitting: Family Medicine

## 2019-05-04 ENCOUNTER — Other Ambulatory Visit: Payer: Self-pay | Admitting: Family Medicine

## 2019-05-17 ENCOUNTER — Ambulatory Visit (INDEPENDENT_AMBULATORY_CARE_PROVIDER_SITE_OTHER): Payer: Medicare Other | Admitting: Family Medicine

## 2019-05-17 ENCOUNTER — Other Ambulatory Visit: Payer: Self-pay

## 2019-05-17 ENCOUNTER — Encounter: Payer: Self-pay | Admitting: Family Medicine

## 2019-05-17 VITALS — BP 110/70 | Ht 63.0 in | Wt 163.0 lb

## 2019-05-17 DIAGNOSIS — Z6828 Body mass index (BMI) 28.0-28.9, adult: Secondary | ICD-10-CM

## 2019-05-17 DIAGNOSIS — M1711 Unilateral primary osteoarthritis, right knee: Secondary | ICD-10-CM

## 2019-05-17 DIAGNOSIS — E785 Hyperlipidemia, unspecified: Secondary | ICD-10-CM

## 2019-05-17 DIAGNOSIS — E663 Overweight: Secondary | ICD-10-CM

## 2019-05-17 DIAGNOSIS — I10 Essential (primary) hypertension: Secondary | ICD-10-CM | POA: Diagnosis not present

## 2019-05-17 DIAGNOSIS — E782 Mixed hyperlipidemia: Secondary | ICD-10-CM

## 2019-05-17 NOTE — Assessment & Plan Note (Signed)
Controlled, no change in medication  

## 2019-05-17 NOTE — Assessment & Plan Note (Signed)
Home safety and use of assuasive device to prevent faalls/ reduce risk is discussed No falls in past 1 year

## 2019-05-17 NOTE — Assessment & Plan Note (Signed)
  Patient re-educated about  the importance of commitment to a  minimum of 150 minutes of exercise per week as able.  The importance of healthy food choices with portion control discussed, as well as eating regularly and within a 12 hour window most days. The need to choose "clean , green" food 50 to 75% of the time is discussed, as well as to make water the primary drink and set a goal of 64 ounces water daily.    Weight /BMI 05/17/2019 02/03/2019 12/14/2018  WEIGHT 163 lb 163 lb 163 lb  HEIGHT 5\' 3"  5\' 3"  5\' 3"   BMI 28.87 kg/m2 28.87 kg/m2 28.87 kg/m2

## 2019-05-17 NOTE — Assessment & Plan Note (Signed)
Hyperlipidemia:Low fat diet discussed and encouraged.   Lipid Panel  Lab Results  Component Value Date   CHOL 185 12/14/2018   HDL 61 12/14/2018   LDLCALC 108 (H) 12/14/2018   TRIG 75 12/14/2018   CHOLHDL 3.0 12/14/2018   Needs to reduce fat in diet

## 2019-05-17 NOTE — Patient Instructions (Addendum)
F/u in office with MD end August or early September,flu vaccine at visit, call if you need me before  Please get fasting CBC, lipid, cmp and eGFR first week in April  Continue to be careful not to fall  No candy late at night so you sleep well!  Thanks for choosing [facility] we consider it a privelige to serve you.   Continue to keep active and to make healthy food choices

## 2019-05-17 NOTE — Progress Notes (Signed)
Virtual Visit via Telephone Note  I connected with Lori Stein on 05/17/19 at  1:20 PM EST by telephone and verified that I am speaking with the correct person using two identifiers.  Location Patient: home Provider: office   I discussed the limitations, risks, security and privacy concerns of performing an evaluation and management service by telephone and the availability of in person appointments. I also discussed with the patient that there may be a patient responsible charge related to this service. The patient expressed understanding and agreed to proceed.   History of Present Illness: F/U chronic problems, medication review, and refill medication when necessary. Review most recent labs and order labs which are due Review preventive health and update with necessary referrals or immunizations as indicated Denies recent fever or chills. Denies sinus pressure, nasal congestion, ear pain or sore throat. Denies chest congestion, productive cough or wheezing. Denies chest pains, palpitations and leg swelling Denies abdominal pain, nausea, vomiting,diarrhea or constipation.   Denies dysuria, frequency, hesitancy or incontinence. Denies uncontrolled joint pain, swelling and limitation in mobility. Denies headaches, seizures, numbness, or tingling. Denies depression, anxiety or insomnia. Denies skin break down or rash.      Observations/Objective:  BP 110/70   Ht 5\' 3"  (1.6 m)   Wt 163 lb (73.9 kg)   BMI 28.87 kg/m  Good communication with no confusion and intact memory. Alert and oriented x 3 No signs of respiratory distress during speech   Assessment and Plan: Essential hypertension Controlled, no change in medication   Hyperlipemia Hyperlipidemia:Low fat diet discussed and encouraged.   Lipid Panel  Lab Results  Component Value Date   CHOL 185 12/14/2018   HDL 61 12/14/2018   LDLCALC 108 (H) 12/14/2018   TRIG 75 12/14/2018   CHOLHDL 3.0 12/14/2018    Needs to reduce fat in diet    Overweight (BMI 25.0-29.9)  Patient re-educated about  the importance of commitment to a  minimum of 150 minutes of exercise per week as able.  The importance of healthy food choices with portion control discussed, as well as eating regularly and within a 12 hour window most days. The need to choose "clean , green" food 50 to 75% of the time is discussed, as well as to make water the primary drink and set a goal of 64 ounces water daily.    Weight /BMI 05/17/2019 02/03/2019 12/14/2018  WEIGHT 163 lb 163 lb 163 lb  HEIGHT 5\' 3"  5\' 3"  5\' 3"   BMI 28.87 kg/m2 28.87 kg/m2 28.87 kg/m2      Osteoarthritis of right knee Home safety and use of assuasive device to prevent faalls/ reduce risk is discussed No falls in past 1 year    Follow Up Instructions:    I discussed the assessment and treatment plan with the patient. The patient was provided an opportunity to ask questions and all were answered. The patient agreed with the plan and demonstrated an understanding of the instructions.   The patient was advised to call back or seek an in-person evaluation if the symptoms worsen or if the condition fails to improve as anticipated.  I provided 22 minutes of non-face-to-face time during this encounter.   12/16/2018, MD

## 2019-05-30 ENCOUNTER — Other Ambulatory Visit (HOSPITAL_COMMUNITY): Payer: Medicare Other

## 2019-05-30 ENCOUNTER — Ambulatory Visit (HOSPITAL_COMMUNITY): Payer: Medicare Other

## 2019-06-15 ENCOUNTER — Ambulatory Visit (INDEPENDENT_AMBULATORY_CARE_PROVIDER_SITE_OTHER): Payer: Medicare Other | Admitting: Family Medicine

## 2019-06-15 ENCOUNTER — Other Ambulatory Visit: Payer: Self-pay

## 2019-06-15 ENCOUNTER — Encounter: Payer: Self-pay | Admitting: Family Medicine

## 2019-06-15 VITALS — BP 138/80 | HR 70 | Temp 97.8°F | Ht 62.0 in | Wt 172.4 lb

## 2019-06-15 DIAGNOSIS — R5383 Other fatigue: Secondary | ICD-10-CM

## 2019-06-15 NOTE — Patient Instructions (Addendum)
  Call if continued fatigue or other concerns   If you have lab work done today you will be contacted with your lab results within the next 2 weeks.  If you have not heard from [place] then please contact us. The fastest way to get your results is to register for My Chart.   IF you received an x-ray today, you will receive an invoice from [facility]. Please contact [facility] at [phone] with questions or concerns regarding your invoice.   IF you received labwork today, you will receive an invoice from [facility]. Please contact [facility] at [phone] with questions or concerns regarding your invoice.   Our billing staff will not be able to assist you with questions regarding bills from these companies.  You will be contacted with the lab results as soon as they are available. The fastest way to get your results is to activate your My Chart account. Instructions are located on the last page of this paperwork. If you have not heard from [place] regarding the results in 2 weeks, please contact this office.

## 2019-06-15 NOTE — Progress Notes (Signed)
Acute Office Visit  Subjective:    Patient ID: Lori Stein, female    DOB: Jan 18, 1933, 86 y.o.   MRN: [id number]  Chief Complaint  Patient presents with  . Fatigue    been feeling bad since got my covid shot on 06/01/19. Still weak and feel bad, with no energy    HPI Patient is in today for fatigue -pt states 2 weeks of fatigue since taking first vaccine. No n/v/d. Pt states over last few days she has been able to clean and cook with improvement in energy level. No cough, fever, chills, headache, diarrhea, or change in appetite.  Past Medical History:  Diagnosis Date  . Back pain   . Hyperlipidemia   . Hypertension   . OA (osteoarthritis) of knee   . Osteophyte, right knee 05/21/2017    Past Surgical History:  Procedure Laterality Date  . Bilateral tubal ligation    . CHOLECYSTECTOMY  2011  . JOINT REPLACEMENT  09/2010   left knee  . removal of mole from left neck  2004    Family History  Problem Relation Age of Onset  . Aneurysm Mother        AAA  . Heart attack Father   . Hypertension Father   . Diabetes Sister   . Diabetes Brother   . Hypertension Sister     Social History   Socioeconomic History  . Marital status: Widowed    Spouse name: Not on file  . Number of children: 3  . Years of education: 67  . Highest education level: Some college, no degree  Occupational History  . Occupation: Retired  Tobacco Use  . Smoking status: Never Smoker  . Smokeless tobacco: Never Used  Substance and Sexual Activity  . Alcohol use: No  . Drug use: No  . Sexual activity: Not Currently  Other Topics Concern  . Not on file  Social History Narrative   Lives alone, sister sometimes comes and stays with her    Social Determinants of Health   Financial Resource Strain:   . Difficulty of Paying Living Expenses:   Food Insecurity:   . Worried About Programme researcher, broadcasting/film/video in the Last Year:   . Barista in the Last Year:   Transportation Needs:   . Sales promotion account executive (Medical):   Marland Kitchen Lack of Transportation (Non-Medical):   Physical Activity:   . Days of Exercise per Week:   . Minutes of Exercise per Session:   Stress:   . Feeling of Stress :   Social Connections:   . Frequency of Communication with Friends and Family:   . Frequency of Social Gatherings with Friends and Family:   . Attends Religious Services:   . Active Member of Clubs or Organizations:   . Attends Banker Meetings:   Marland Kitchen Marital Status:   Intimate Partner Violence:   . Fear of Current or Ex-Partner:   . Emotionally Abused:   Marland Kitchen Physically Abused:   . Sexually Abused:     Outpatient Medications Prior to Visit  Medication Sig Dispense Refill  . acetaminophen (ARTHRITIS PAIN RELIEF) 650 MG CR tablet Take 1 tablet (650 mg total) by mouth 2 (two) times daily. 60 tablet 3  . aspirin (ASPIRIN LOW DOSE) 81 MG EC tablet Take 81 mg by mouth daily.      . famotidine (PEPCID) 40 MG tablet TAKE (1) TABLET BY MOUTH ONCE DAILY AS NEEDED FOR INDIGESTION. 30 tablet 0  .  hydrochlorothiazide (HYDRODIURIL) 25 MG tablet TAKE 1 TABLET BY MOUTH ONCE DAILY. 90 tablet 0  . Omega-3 Fatty Acids (FISH OIL) 1000 MG CAPS Take by mouth. Take 3 caps by mouth daily       . potassium chloride (K-DUR,KLOR-CON) 10 MEQ tablet TAKE 1 TABLET BY MOUTH TWICE DAILY 180 tablet 2  . simvastatin (ZOCOR) 10 MG tablet TAKE 1 TABLET BY MOUTH AT BEDTIME FOR CHOLESTEROL 90 tablet 0   No facility-administered medications prior to visit.    Allergies  Allergen Reactions  . Penicillins     Has patient had a PCN reaction causing immediate rash, facial/tongue/throat swelling, SOB or lightheadedness with hypotension: Yes Has patient had a PCN reaction causing severe rash involving mucus membranes or skin necrosis: No Has patient had a PCN reaction that required hospitalization No Has patient had a PCN reaction occurring within the last 10 years: No If all of the above answers are "NO", then may  proceed with Cephalosporin use.  . Sulfasalazine   . Sulfonamide Derivatives     Review of Systems  Constitutional: Positive for fatigue. Negative for appetite change and fever.  HENT: Negative for congestion, ear pain and sinus pain.   Eyes: Negative.   Respiratory: Negative.   Cardiovascular: Negative.   Gastrointestinal: Negative.   Endocrine: Negative.   Genitourinary: Negative.   Musculoskeletal: Negative.   Skin: Negative.   Allergic/Immunologic: Negative.   Neurological: Negative.   Hematological: Negative.   Psychiatric/Behavioral: Negative.        Objective:    Physical Exam Constitutional:      Appearance: Normal appearance. She is normal weight.  HENT:     Head: Normocephalic and atraumatic.     Right Ear: Tympanic membrane normal.     Left Ear: Tympanic membrane normal.     Nose: Nose normal.     Mouth/Throat:     Mouth: Mucous membranes are moist.  Cardiovascular:     Rate and Rhythm: Normal rate and regular rhythm.     Pulses: Normal pulses.     Heart sounds: Normal heart sounds.  Pulmonary:     Effort: Pulmonary effort is normal.     Breath sounds: Normal breath sounds.  Neurological:     Mental Status: She is alert.  Psychiatric:        Mood and Affect: Mood normal.        Behavior: Behavior normal.     BP 138/80 (BP Location: Left Arm, Patient Position: Sitting, Cuff Size: Normal)   Pulse 70   Temp 97.8 F (36.6 C) (Temporal)   Ht 5\' 2"  (1.575 m)   Wt 172 lb 6.4 oz (78.2 kg)   SpO2 95%   BMI 31.53 kg/m  Wt Readings from Last 3 Encounters:  06/15/19 172 lb 6.4 oz (78.2 kg)  05/17/19 163 lb (73.9 kg)  02/03/19 163 lb (73.9 kg)    Health Maintenance Due  Topic Date Due  . DEXA SCAN  Never done    Lab Results  Component Value Date   TSH 3.51 12/14/2018   Lab Results  Component Value Date   WBC 6.8 03/22/2018   HGB 14.9 03/22/2018   HCT 43.6 03/22/2018   MCV 88.8 03/22/2018   PLT 257 03/22/2018   Lab Results  Component  Value Date   NA 141 12/14/2018   K 3.9 12/14/2018   CO2 29 12/14/2018   GLUCOSE 91 12/14/2018   BUN 24 12/14/2018   CREATININE 0.79 12/14/2018   BILITOT 0.8  12/14/2018   ALKPHOS 39 03/28/2016   AST 16 12/14/2018   ALT 16 12/14/2018   PROT 7.2 12/14/2018   ALBUMIN 3.6 03/28/2016   CALCIUM 10.8 (H) 12/14/2018   ANIONGAP 8 03/28/2016   Lab Results  Component Value Date   CHOL 185 12/14/2018   Lab Results  Component Value Date   HDL 61 12/14/2018   Lab Results  Component Value Date   LDLCALC 108 (H) 12/14/2018   Lab Results  Component Value Date   TRIG 75 12/14/2018   Lab Results  Component Value Date   CHOLHDL 3.0 12/14/2018       Assessment & Plan:  1. Fatigue, unspecified type D/w pt side effects from vaccine. Pt with improved energy. Pt to call if no improvement. Encouraged pt to take additional vaccine when available. Pt with normal appetite. Encouraged to drink plenty of water  Pearlie Nies Hannah Beat, MD

## 2019-06-28 ENCOUNTER — Other Ambulatory Visit: Payer: Self-pay | Admitting: Family Medicine

## 2019-09-27 ENCOUNTER — Encounter: Payer: Self-pay | Admitting: Family Medicine

## 2019-09-27 ENCOUNTER — Ambulatory Visit (INDEPENDENT_AMBULATORY_CARE_PROVIDER_SITE_OTHER): Payer: Medicare Other | Admitting: Family Medicine

## 2019-09-27 ENCOUNTER — Other Ambulatory Visit: Payer: Self-pay

## 2019-09-27 VITALS — BP 106/69 | HR 80 | Resp 16 | Ht 62.0 in | Wt 165.1 lb

## 2019-09-27 DIAGNOSIS — I10 Essential (primary) hypertension: Secondary | ICD-10-CM

## 2019-09-27 DIAGNOSIS — E559 Vitamin D deficiency, unspecified: Secondary | ICD-10-CM

## 2019-09-27 DIAGNOSIS — E782 Mixed hyperlipidemia: Secondary | ICD-10-CM

## 2019-09-27 DIAGNOSIS — M1711 Unilateral primary osteoarthritis, right knee: Secondary | ICD-10-CM | POA: Diagnosis not present

## 2019-09-27 DIAGNOSIS — R12 Heartburn: Secondary | ICD-10-CM

## 2019-09-27 DIAGNOSIS — E663 Overweight: Secondary | ICD-10-CM | POA: Diagnosis not present

## 2019-09-27 DIAGNOSIS — Z1231 Encounter for screening mammogram for malignant neoplasm of breast: Secondary | ICD-10-CM

## 2019-09-27 NOTE — Patient Instructions (Signed)
Keep wellness visit in November as scheduled.  Call in September for your flu vaccine.  MD follow-up visit in January in office please schedule.  Fasting labs today as ordered in March also CBC TSH and vitamin D to be added.  Continue to use your cane to reduce your risk of falling.  Exam today is excellent.  No change in medications at this time.( refill x 6 months )  Thanks for choosing [facility], we consider it a privelige to serve you.

## 2019-09-28 ENCOUNTER — Ambulatory Visit: Payer: Medicare Other | Admitting: Family Medicine

## 2019-09-28 LAB — LIPID PANEL
Cholesterol: 161 mg/dL (ref <200)
HDL: 58 mg/dL (ref >=50)
LDL Cholesterol (Calc): 87 mg/dL (calc)
Non-HDL Cholesterol (Calc): 103 mg/dL (calc) (ref <130)
Total CHOL/HDL Ratio: 2.8 (calc) (ref <5.0)
Triglycerides: 73 mg/dL (ref <150)

## 2019-09-28 LAB — CBC
HCT: 45.4 % — ABNORMAL HIGH (ref 35.0–45.0)
Hemoglobin: 15.1 g/dL (ref 11.7–15.5)
MCH: 29.4 pg (ref 27.0–33.0)
MCHC: 33.3 g/dL (ref 32.0–36.0)
MCV: 88.5 fL (ref 80.0–100.0)
MPV: 10.4 fL (ref 7.5–12.5)
Platelets: 274 10*3/uL (ref 140–400)
RBC: 5.13 10*6/uL — ABNORMAL HIGH (ref 3.80–5.10)
RDW: 14.7 % (ref 11.0–15.0)
WBC: 7.2 10*3/uL (ref 3.8–10.8)

## 2019-09-28 LAB — COMPLETE METABOLIC PANEL WITH GFR
AG Ratio: 1.5 (calc) (ref 1.0–2.5)
ALT: 11 U/L (ref 6–29)
AST: 14 U/L (ref 10–35)
Albumin: 4.4 g/dL (ref 3.6–5.1)
Alkaline phosphatase (APISO): 47 U/L (ref 37–153)
BUN: 21 mg/dL (ref 7–25)
CO2: 30 mmol/L (ref 20–32)
Calcium: 10.7 mg/dL — ABNORMAL HIGH (ref 8.6–10.4)
Chloride: 100 mmol/L (ref 98–110)
Creat: 0.7 mg/dL (ref 0.60–0.88)
GFR, Est African American: 92 mL/min/{1.73_m2} (ref >=60)
GFR, Est Non African American: 80 mL/min/{1.73_m2} (ref >=60)
Globulin: 3 g/dL (calc) (ref 1.9–3.7)
Glucose, Bld: 81 mg/dL (ref 65–99)
Potassium: 3.5 mmol/L (ref 3.5–5.3)
Sodium: 141 mmol/L (ref 135–146)
Total Bilirubin: 0.9 mg/dL (ref 0.2–1.2)
Total Protein: 7.4 g/dL (ref 6.1–8.1)

## 2019-09-28 LAB — VITAMIN D 25 HYDROXY (VIT D DEFICIENCY, FRACTURES): Vit D, 25-Hydroxy: 28 ng/mL — ABNORMAL LOW (ref 30–100)

## 2019-09-28 LAB — TSH: TSH: 2.57 mIU/L (ref 0.40–4.50)

## 2019-09-30 NOTE — Assessment & Plan Note (Signed)
Controlled, no change in medication DASH diet and commitment to daily physical activity for a minimum of 30 minutes discussed and encouraged, as a part of hypertension management. The importance of attaining a healthy weight is also discussed.  BP/Weight 09/27/2019 06/15/2019 05/17/2019 02/03/2019 12/14/2018 07/21/2018 03/22/2018  Systolic BP 106 138 110 110 110 128 128  Diastolic BP 69 80 70 70 70 78 78  Wt. (Lbs) 165.12 172.4 163 163 163 175 164  BMI 30.2 31.53 28.87 28.87 28.87 31 29.05

## 2019-09-30 NOTE — Assessment & Plan Note (Signed)
Uses cane at all times and tylenol as needed, denies falls and practices home safety

## 2019-09-30 NOTE — Assessment & Plan Note (Signed)
Hyperlipidemia:Low fat diet discussed and encouraged.   Lipid Panel  Lab Results  Component Value Date   CHOL 161 09/27/2019   HDL 58 09/27/2019   LDLCALC 87 09/27/2019   TRIG 73 09/27/2019   CHOLHDL 2.8 09/27/2019     Controlled, no change in medication

## 2019-09-30 NOTE — Progress Notes (Signed)
Lori Stein     MRN: [id number]      DOB: May 15, 1932   HPI Lori Stein is here for follow up and re-evaluation of chronic medical conditions, medication management and review of any available recent lab and radiology data.  Preventive health is updated, specifically   Immunization.    The PT denies any adverse reactions to current medications since the last visit.  There are no new concerns.  There are no specific complaints other thn arthritis, which is stable, denies falls or recent flare  ROS Denies recent fever or chills. Denies sinus pressure, nasal congestion, ear pain or sore throat. Denies chest congestion, productive cough or wheezing. Denies chest pains, palpitations and leg swelling Denies abdominal pain, nausea, vomiting,diarrhea or constipation.   Denies dysuria, frequency, hesitancy or incontinence. Chronic  joint pain, swelling and limitation in mobility. Denies headaches, seizures, numbness, or tingling. Denies depression, anxiety or insomnia. Denies skin break down or rash.   PE  BP 106/69   Pulse 80   Resp 16   Ht 5\' 2"  (1.575 m)   Wt 165 lb 1.9 oz (74.9 kg)   SpO2 96%   BMI 30.20 kg/m   Patient alert and oriented and in no cardiopulmonary distress.  HEENT: No facial asymmetry, EOMI,     Neck supple .  Chest: Clear to auscultation bilaterally.  CVS: S1, S2 no murmurs, no S3.Regular rate.  ABD: Soft non tender.   Ext: No edema  MS: decreased  ROM spine, shoulders, hips and knees.  Skin: Intact, no ulcerations or rash noted.  Psych: Good eye contact, normal affect. Memory intact not anxious or depressed appearing.  CNS: CN 2-12 intact, power,  normal throughout.no focal deficits noted.   Assessment & Plan  Essential hypertension Controlled, no change in medication DASH diet and commitment to daily physical activity for a minimum of 30 minutes discussed and encouraged, as a part of hypertension management. The importance of  attaining a healthy weight is also discussed.  BP/Weight 09/27/2019 06/15/2019 05/17/2019 02/03/2019 12/14/2018 07/21/2018 03/22/2018  Systolic BP 106 138 110 110 110 128 128  Diastolic BP 69 80 70 70 70 78 78  Wt. (Lbs) 165.12 172.4 163 163 163 175 164  BMI 30.2 31.53 28.87 28.87 28.87 31 29.05       Hyperlipemia Hyperlipidemia:Low fat diet discussed and encouraged.   Lipid Panel  Lab Results  Component Value Date   CHOL 161 09/27/2019   HDL 58 09/27/2019   LDLCALC 87 09/27/2019   TRIG 73 09/27/2019   CHOLHDL 2.8 09/27/2019     Controlled, no change in medication   Osteoarthritis of right knee Uses cane at all times and tylenol as needed, denies falls and practices home safety  Heartburn Controlled, no change in medication   Overweight (BMI 25.0-29.9)  Patient re-educated about  the importance of commitment to a  minimum of 150 minutes of exercise per week as able.  The importance of healthy food choices with portion control discussed, as well as eating regularly and within a 12 hour window most days. The need to choose "clean , green" food 50 to 75% of the time is discussed, as well as to make water the primary drink and set a goal of 64 ounces water daily.    Weight /BMI 09/27/2019 06/15/2019 05/17/2019  WEIGHT 165 lb 1.9 oz 172 lb 6.4 oz 163 lb  HEIGHT 5\' 2"  5\' 2"  5\' 3"   BMI 30.2 kg/m2 31.53 kg/m2 28.87 kg/m2

## 2019-09-30 NOTE — Assessment & Plan Note (Signed)
Controlled, no change in medication  

## 2019-09-30 NOTE — Assessment & Plan Note (Signed)
  Patient re-educated about  the importance of commitment to a  minimum of 150 minutes of exercise per week as able.  The importance of healthy food choices with portion control discussed, as well as eating regularly and within a 12 hour window most days. The need to choose "clean , green" food 50 to 75% of the time is discussed, as well as to make water the primary drink and set a goal of 64 ounces water daily.    Weight /BMI 09/27/2019 06/15/2019 05/17/2019  WEIGHT 165 lb 1.9 oz 172 lb 6.4 oz 163 lb  HEIGHT 5\' 2"  5\' 2"  5\' 3"   BMI 30.2 kg/m2 31.53 kg/m2 28.87 kg/m2

## 2019-11-23 ENCOUNTER — Ambulatory Visit: Payer: Medicare Other | Admitting: Family Medicine

## 2019-12-15 ENCOUNTER — Other Ambulatory Visit: Payer: Self-pay | Admitting: Family Medicine

## 2020-01-10 ENCOUNTER — Telehealth: Payer: Self-pay

## 2020-01-10 NOTE — Telephone Encounter (Signed)
Called to see if she can get the booster. Advised patient that yes she can get the booster.

## 2020-02-07 ENCOUNTER — Ambulatory Visit (INDEPENDENT_AMBULATORY_CARE_PROVIDER_SITE_OTHER): Payer: Medicare Other | Admitting: Family Medicine

## 2020-02-07 ENCOUNTER — Other Ambulatory Visit: Payer: Self-pay

## 2020-02-07 ENCOUNTER — Encounter: Payer: Self-pay | Admitting: Family Medicine

## 2020-02-07 VITALS — BP 106/69 | Ht 62.0 in | Wt 165.0 lb

## 2020-02-07 DIAGNOSIS — Z0000 Encounter for general adult medical examination without abnormal findings: Secondary | ICD-10-CM | POA: Diagnosis not present

## 2020-02-07 NOTE — Progress Notes (Signed)
Subjective:   Lori Stein is a 86 y.o. female who presents for Medicare Annual (Subsequent) preventive examination.  Participating in call: Nurse, patient, provider Method of visit: Telephone  Location of Patient: Home Location of Provider: Office Consent was obtain for visit over the telephone. Services rendered by provider: Visit was performed via telephone  I verified that I am speaking with the correct person using two identifiers.  Review of Systems Cardiac Risk Factors include: advanced age (>44men, >69 women)     Objective:    Today's Vitals   02/07/20 1304 02/07/20 1305  BP: 106/69   Weight: 165 lb (74.8 kg)   Height: 5\' 2"  (1.575 m)   PainSc: 0-No pain 0-No pain   Body mass index is 30.18 kg/m.  Advanced Directives 02/07/2020 02/01/2018 03/28/2016  Does Patient Have a Medical Advance Directive? No;Yes Yes Yes  Type of 05/26/2016 of [employer];Living will Living will Healthcare Power of Attorney  Does patient want to make changes to medical advance directive? - No - Patient declined -  Copy of Healthcare Power of Attorney in Chart? No - copy requested - No - copy requested    Current Medications (verified) Outpatient Encounter Medications as of 02/07/2020  Medication Sig  . acetaminophen (ARTHRITIS PAIN RELIEF) 650 MG CR tablet Take 1 tablet (650 mg total) by mouth 2 (two) times daily.  02/09/2020 aspirin (ASPIRIN LOW DOSE) 81 MG EC tablet Take 81 mg by mouth daily.    . famotidine (PEPCID) 40 MG tablet TAKE (1) TABLET BY MOUTH ONCE DAILY AS NEEDED FOR INDIGESTION.  . hydrochlorothiazide (HYDRODIURIL) 25 MG tablet TAKE 1 TABLET BY MOUTH ONCE DAILY.  Marland Kitchen Omega-3 Fatty Acids (FISH OIL) 1000 MG CAPS Take by mouth. Take 3 caps by mouth daily     . potassium chloride (K-DUR,KLOR-CON) 10 MEQ tablet TAKE 1 TABLET BY MOUTH TWICE DAILY  . simvastatin (ZOCOR) 10 MG tablet TAKE 1 TABLET BY MOUTH AT BEDTIME FOR CHOLESTEROL   No facility-administered  encounter medications on file as of 02/07/2020.    Allergies (verified) Penicillins, Sulfasalazine, and Sulfonamide derivatives   History: Past Medical History:  Diagnosis Date  . Back pain   . Hyperlipidemia   . Hypertension   . OA (osteoarthritis) of knee   . Osteophyte, right knee 05/21/2017   Past Surgical History:  Procedure Laterality Date  . Bilateral tubal ligation    . CHOLECYSTECTOMY  2011  . JOINT REPLACEMENT  09/2010   left knee  . removal of mole from left neck  2004   Family History  Problem Relation Age of Onset  . Aneurysm Mother        AAA  . Heart attack Father   . Hypertension Father   . Diabetes Sister   . Diabetes Brother   . Hypertension Sister    Social History   Socioeconomic History  . Marital status: Widowed    Spouse name: Not on file  . Number of children: 3  . Years of education: 55  . Highest education level: Some college, no degree  Occupational History  . Occupation: Retired  Tobacco Use  . Smoking status: Never Smoker  . Smokeless tobacco: Never Used  Vaping Use  . Vaping Use: Never used  Substance and Sexual Activity  . Alcohol use: No  . Drug use: No  . Sexual activity: Not Currently  Other Topics Concern  . Not on file  Social History Narrative   Lives alone, sister sometimes  comes and stays with her    Social Determinants of Health   Financial Resource Strain: Low Risk   . Difficulty of Paying Living Expenses: Not hard at all  Food Insecurity: No Food Insecurity  . Worried About Programme researcher, broadcasting/film/video in the Last Year: Never true  . Ran Out of Food in the Last Year: Never true  Transportation Needs: No Transportation Needs  . Lack of Transportation (Medical): No  . Lack of Transportation (Non-Medical): No  Physical Activity: Sufficiently Active  . Days of Exercise per Week: 7 days  . Minutes of Exercise per Session: 30 min  Stress: No Stress Concern Present  . Feeling of Stress : Not at all  Social Connections:  Socially Isolated  . Frequency of Communication with Friends and Family: More than three times a week  . Frequency of Social Gatherings with Friends and Family: Twice a week  . Attends Religious Services: Never  . Active Member of Clubs or Organizations: No  . Attends Banker Meetings: Never  . Marital Status: Widowed    Tobacco Counseling Counseling given: Yes   Clinical Intake:  Pre-visit preparation completed: Yes  Pain : No/denies pain Pain Score: 0-No pain     BMI - recorded: 30.19 Nutritional Status: BMI > 30  Obese Nutritional Risks: None Diabetes: No  How often do you need to have someone help you when you read instructions, pamphlets, or other written materials from your doctor or pharmacy?: 1 - Never What is the last grade level you completed in school?: 12  Diabetic? no  Interpreter Needed?: No  Information entered by :: Jerilynn Mages, LPN   Activities of Daily Living In your present state of health, do you have any difficulty performing the following activities: 02/07/2020  Hearing? N  Vision? N  Difficulty concentrating or making decisions? N  Walking or climbing stairs? N  Dressing or bathing? N  Doing errands, shopping? N  Preparing Food and eating ? N  Using the Toilet? N  In the past six months, have you accidently leaked urine? N  Do you have problems with loss of bowel control? N  Managing your Medications? N  Managing your Finances? N  Housekeeping or managing your Housekeeping? N  Some recent data might be hidden    Patient Care Team: [name], MD as PCP - General  Indicate any recent Medical Services you may have received from other than [facility] providers in the past year (date may be approximate).     Assessment:   This is a routine wellness examination for [facility].  Hearing/Vision screen No exam data present  Dietary issues and exercise activities discussed: Current Exercise Habits: Home exercise routine,  Type of exercise: walking, Time (Minutes): 30, Frequency (Times/Week): 7, Weekly Exercise (Minutes/Week): 210, Intensity: Mild  Goals    . Exercise 150 minutes per week (moderate activity)    . Prevent Falls      Depression Screen PHQ 2/9 Scores 02/07/2020 02/07/2020 09/27/2019 06/15/2019 02/03/2019 07/21/2018 03/22/2018  PHQ - 2 Score 0 0 1 0 1 0 0  PHQ- 9 Score - - - - - - 1    Fall Risk Fall Risk  02/07/2020 09/27/2019 06/15/2019 05/17/2019 02/03/2019  Falls in the past year? 0 1 0 0 0  Number falls in past yr: 0 1 0 0 0  Injury with Fall? 0 0 0 0 0  Risk for fall due to : No Fall Risks - - - -  Follow up Falls evaluation completed - Falls evaluation completed - -    Any stairs in or around the home? Yes  If so, are there any without handrails? No  Home free of loose throw rugs in walkways, pet beds, electrical cords, etc? Yes  Adequate lighting in your home to reduce risk of falls? Yes   ASSISTIVE DEVICES UTILIZED TO PREVENT FALLS:  Life alert? Yes  Use of a cane, walker or w/c? Yes  Grab bars in the bathroom? Yes  Shower chair or bench in shower? Yes  Elevated toilet seat or a handicapped toilet? Yes   TIMED UP AND GO:  Was the test performed? No  Length of time to ambulate n/a   Cognitive Function:     6CIT Screen 02/07/2020 02/03/2019 02/01/2018 01/19/2017  What Year? 0 points 0 points 0 points 0 points  What month? 0 points 0 points 0 points 0 points  What time? 0 points 0 points 0 points 0 points  Count back from 20 0 points 0 points 0 points 0 points  Months in reverse 0 points 0 points 0 points 0 points  Repeat phrase 0 points 0 points 0 points 6 points  Total Score 0 0 0 6    Immunizations Immunization History  Administered Date(s) Administered  . Fluad Quad(high Dose 65+) 12/14/2018  . Influenza Split 11/20/2011  . Influenza Whole 05/12/2007, 01/03/2009, 02/19/2010, 12/11/2010  . Influenza,inj,Quad PF,6+ Mos 12/27/2012, 01/13/2014, 12/18/2014, 03/26/2016,  01/19/2017, 11/30/2017  . Moderna SARS-COVID-2 Vaccination 05/02/2019, 06/01/2019  . Pneumococcal Conjugate-13 10/20/2013  . Pneumococcal Polysaccharide-23 11/23/2003  . Td 11/23/2003    TDAP status: Up to date Flu Vaccine status: Up to date Pneumococcal vaccine status: Declined,  Education has been provided regarding the importance of this vaccine but patient still declined. Advised may receive this vaccine at local pharmacy or Health Dept. Aware to provide a copy of the vaccination record if obtained from local pharmacy or Health Dept. Verbalized acceptance and understanding.  Covid-19 vaccine status: Completed vaccines  Qualifies for Shingles Vaccine? Yes   Zostavax completed No   Shingrix Completed?: No.    Education has been provided regarding the importance of this vaccine. Patient has been advised to call insurance company to determine out of pocket expense if they have not yet received this vaccine. Advised may also receive vaccine at local pharmacy or Health Dept. Verbalized acceptance and understanding.  Screening Tests Health Maintenance  Topic Date Due  . DEXA SCAN  Never done  . TETANUS/TDAP  11/22/2013  . INFLUENZA VACCINE  10/16/2019  . COVID-19 Vaccine  Completed  . PNA vac Low Risk Adult  Completed    Health Maintenance  Health Maintenance Due  Topic Date Due  . DEXA SCAN  Never done  . TETANUS/TDAP  11/22/2013  . INFLUENZA VACCINE  10/16/2019    Colorectal cancer screening: No longer required.  Mammogram status: No longer required.   Lung Cancer Screening: (Low Dose CT Chest recommended if Age 32-80 years, 30 pack-year currently smoking OR have quit w/in 15years.) does not qualify.   Lung Cancer Screening Referral: n/a  Additional Screening:  Hepatitis C Screening: does not qualify  Vision Screening: Recommended annual ophthalmology exams for early detection of glaucoma and other disorders of the eye. Is the patient up to date with their annual eye  exam?  yes  Who is the provider or what is the name of the office in which the patient attends annual eye exams? [facility] If pt is  not established with a provider, would they like to be referred to a provider to establish care? n/a.   Dental Screening: Recommended annual dental exams for proper oral hygiene  Community Resource Referral / Chronic Care Management: CRR required this visit?  No   CCM required this visit?  No      Plan:     1. Encounter for Medicare annual wellness exam   I have personally reviewed and noted the following in the patient's chart:   . Medical and social history . Use of alcohol, tobacco or illicit drugs  . Current medications and supplements . Functional ability and status . Nutritional status . Physical activity . Advanced directives . List of other physicians . Hospitalizations, surgeries, and ER visits in previous 12 months . Vitals . Screenings to include cognitive, depression, and falls . Referrals and appointments  In addition, I have reviewed and discussed with patient certain preventive protocols, quality metrics, and best practice recommendations. A written personalized care plan for preventive services as well as general preventive health recommendations were provided to patient.     Freddy FinnerHannah M Bentleigh Stankus, NP   02/07/2020   Nurse Notes: AWV conducted in the office by phone with patient consent. Patient was at their home at the time and provider was in the office. Visit took 30 minutes to complete.  I verify the above information.  Reviewed by provider in detail.  In addition to communication with patient at completion of annual wellness visit.

## 2020-02-07 NOTE — Patient Instructions (Signed)
Lori Stein , Thank you for taking time to come for your Medicare Wellness Visit. I appreciate your ongoing commitment to your health goals. Please review the following plan we discussed and let me know if I can assist you in the future.   Screening recommendations/referrals: Colonoscopy: Complete  Mammogram: Complete  Bone Density: Complete Recommended yearly ophthalmology/optometry visit for glaucoma screening and checkup Recommended yearly dental visit for hygiene and checkup  Vaccinations: Influenza vaccine: Fall 2022 Pneumococcal vaccine: Complete Tdap vaccine: Due Shingles vaccine: Declined  Advanced directives: Yes  Conditions/risks identified: None  Next appointment: 03/27/20 @ 9am  Preventive Care 65 Years and Older, Female Preventive care refers to lifestyle choices and visits with your health care provider that can promote health and wellness. What does preventive care include?  A yearly physical exam. This is also called an annual well check.  Dental exams once or twice a year.  Routine eye exams. Ask your health care provider how often you should have your eyes checked.  Personal lifestyle choices, including:  Daily care of your teeth and gums.  Regular physical activity.  Eating a healthy diet.  Avoiding tobacco and drug use.  Limiting alcohol use.  Practicing safe sex.  Taking low-dose aspirin every day.  Taking vitamin and mineral supplements as recommended by your health care provider. What happens during an annual well check? The services and screenings done by your health care provider during your annual well check will depend on your age, overall health, lifestyle risk factors, and family history of disease. Counseling  Your health care provider may ask you questions about your:  Alcohol use.  Tobacco use.  Drug use.  Emotional well-being.  Home and relationship well-being.  Sexual activity.  Eating habits.  History of  falls.  Memory and ability to understand (cognition).  Work and work Astronomer.  Reproductive health. Screening  You may have the following tests or measurements:  Height, weight, and BMI.  Blood pressure.  Lipid and cholesterol levels. These may be checked every 5 years, or more frequently if you are over 17 years old.  Skin check.  Lung cancer screening. You may have this screening every year starting at age 38 if you have a 30-pack-year history of smoking and currently smoke or have quit within the past 15 years.  Fecal occult blood test (FOBT) of the stool. You may have this test every year starting at age 87.  Flexible sigmoidoscopy or colonoscopy. You may have a sigmoidoscopy every 5 years or a colonoscopy every 10 years starting at age 20.  Hepatitis C blood test.  Hepatitis B blood test.  Sexually transmitted disease (STD) testing.  Diabetes screening. This is done by checking your blood sugar (glucose) after you have not eaten for a while (fasting). You may have this done every 1-3 years.  Bone density scan. This is done to screen for osteoporosis. You may have this done starting at age 35.  Mammogram. This may be done every 1-2 years. Talk to your health care provider about how often you should have regular mammograms. Talk with your health care provider about your test results, treatment options, and if necessary, the need for more tests. Vaccines  Your health care provider may recommend certain vaccines, such as:  Influenza vaccine. This is recommended every year.  Tetanus, diphtheria, and acellular pertussis (Tdap, Td) vaccine. You may need a Td booster every 10 years.  Zoster vaccine. You may need this after age 39.  Pneumococcal 13-valent conjugate (PCV13)  vaccine. One dose is recommended after age 86.  Pneumococcal polysaccharide (PPSV23) vaccine. One dose is recommended after age 63. Talk to your health care provider about which screenings and  vaccines you need and how often you need them. This information is not intended to replace advice given to you by your health care provider. Make sure you discuss any questions you have with your health care provider. Document Released: 03/30/2015 Document Revised: 11/21/2015 Document Reviewed: 01/02/2015 Elsevier Interactive Patient Education  2017 [place] Prevention in the Home Falls can cause injuries. They can happen to people of all ages. There are many things you can do to make your home safe and to help prevent falls. What can I do on the outside of my home?  Regularly fix the edges of walkways and driveways and fix any cracks.  Remove anything that might make you trip as you walk through a door, such as a raised step or threshold.  Trim any bushes or trees on the path to your home.  Use bright outdoor lighting.  Clear any walking paths of anything that might make someone trip, such as rocks or tools.  Regularly check to see if handrails are loose or broken. Make sure that both sides of any steps have handrails.  Any raised decks and porches should have guardrails on the edges.  Have any leaves, snow, or ice cleared regularly.  Use sand or salt on walking paths during winter.  Clean up any spills in your garage right away. This includes oil or grease spills. What can I do in the bathroom?  Use night lights.  Install grab bars by the toilet and in the tub and shower. Do not use towel bars as grab bars.  Use non-skid mats or decals in the tub or shower.  If you need to sit down in the shower, use a plastic, non-slip stool.  Keep the floor dry. Clean up any water that spills on the floor as soon as it happens.  Remove soap buildup in the tub or shower regularly.  Attach bath mats securely with double-sided non-slip rug tape.  Do not have throw rugs and other things on the floor that can make you trip. What can I do in the bedroom?  Use night  lights.  Make sure that you have a light by your bed that is easy to reach.  Do not use any sheets or blankets that are too big for your bed. They should not hang down onto the floor.  Have a firm chair that has side arms. You can use this for support while you get dressed.  Do not have throw rugs and other things on the floor that can make you trip. What can I do in the kitchen?  Clean up any spills right away.  Avoid walking on wet floors.  Keep items that you use a lot in easy-to-reach places.  If you need to reach something above you, use a strong step stool that has a grab bar.  Keep electrical cords out of the way.  Do not use floor polish or wax that makes floors slippery. If you must use wax, use non-skid floor wax.  Do not have throw rugs and other things on the floor that can make you trip. What can I do with my stairs?  Do not leave any items on the stairs.  Make sure that there are handrails on both sides of the stairs and use them. Fix handrails that are  broken or loose. Make sure that handrails are as long as the stairways.  Check any carpeting to make sure that it is firmly attached to the stairs. Fix any carpet that is loose or worn.  Avoid having throw rugs at the top or bottom of the stairs. If you do have throw rugs, attach them to the floor with carpet tape.  Make sure that you have a light switch at the top of the stairs and the bottom of the stairs. If you do not have them, ask someone to add them for you. What else can I do to help prevent falls?  Wear shoes that:  Do not have high heels.  Have rubber bottoms.  Are comfortable and fit you well.  Are closed at the toe. Do not wear sandals.  If you use a stepladder:  Make sure that it is fully opened. Do not climb a closed stepladder.  Make sure that both sides of the stepladder are locked into place.  Ask someone to hold it for you, if possible.  Clearly mark and make sure that you can  see:  Any grab bars or handrails.  First and last steps.  Where the edge of each step is.  Use tools that help you move around (mobility aids) if they are needed. These include:  Canes.  Walkers.  Scooters.  Crutches.  Turn on the lights when you go into a dark area. Replace any light bulbs as soon as they burn out.  Set up your furniture so you have a clear path. Avoid moving your furniture around.  If any of your floors are uneven, fix them.  If there are any pets around you, be aware of where they are.  Review your medicines with your doctor. Some medicines can make you feel dizzy. This can increase your chance of falling. Ask your doctor what other things that you can do to help prevent falls. This information is not intended to replace advice given to you by your health care provider. Make sure you discuss any questions you have with your health care provider. Document Released: 12/28/2008 Document Revised: 08/09/2015 Document Reviewed: 04/07/2014 Elsevier Interactive Patient Education  2017 [employer].

## 2020-02-17 DIAGNOSIS — Z23 Encounter for immunization: Secondary | ICD-10-CM | POA: Diagnosis not present

## 2020-03-05 ENCOUNTER — Other Ambulatory Visit: Payer: Self-pay | Admitting: Family Medicine

## 2020-03-27 ENCOUNTER — Other Ambulatory Visit: Payer: Self-pay

## 2020-03-27 ENCOUNTER — Telehealth (INDEPENDENT_AMBULATORY_CARE_PROVIDER_SITE_OTHER): Payer: Medicare Other | Admitting: Family Medicine

## 2020-03-27 ENCOUNTER — Encounter: Payer: Self-pay | Admitting: Family Medicine

## 2020-03-27 DIAGNOSIS — M1711 Unilateral primary osteoarthritis, right knee: Secondary | ICD-10-CM

## 2020-03-27 DIAGNOSIS — I10 Essential (primary) hypertension: Secondary | ICD-10-CM

## 2020-03-27 DIAGNOSIS — E782 Mixed hyperlipidemia: Secondary | ICD-10-CM

## 2020-03-27 MED ORDER — PREDNISONE 5 MG PO TABS
5.0000 mg | ORAL_TABLET | Freq: Two times a day (BID) | ORAL | 0 refills | Status: AC
Start: 2020-03-27 — End: 2020-04-01

## 2020-03-27 NOTE — Progress Notes (Signed)
Virtual Visit via Telephone Note  I connected with Lori Stein on 03/27/20 at  3:00 PM EST by telephone and verified that I am speaking with the correct person using two identifiers.  Location: Patient: home Provider: work   I discussed the limitations, risks, security and privacy concerns of performing an evaluation and management service by telephone and the availability of in person appointments. I also discussed with the patient that there may be a patient responsible charge related to this service. The patient expressed understanding and agreed to proceed.   History of Present Illness:   f/U chronic problems Reports doing well overall, her arthritis currently flared up with cold weather. No falls or near falls Denies recent fever or chills. Denies sinus pressure, nasal congestion, ear pain or sore throat. Denies chest congestion, productive cough or wheezing. Denies chest pains, palpitations and leg swelling Denies abdominal pain, nausea, vomiting,diarrhea or constipation.   Denies dysuria, frequency, hesitancy or incontinence.  Denies headaches, seizures, numbness, or tingling. Denies depression, anxiety or insomnia. Denies skin break down or rash.     Observations/Objective: There were no vitals taken for this visit. Good communication with no confusion and intact memory. Alert and oriented x 3 No signs of respiratory distress during speech    Assessment and Plan:  Essential hypertension DASH diet and commitment to daily physical activity for a minimum of 30 minutes discussed and encouraged, as a part of hypertension management. The importance of attaining a healthy weight is also discussed.  BP/Weight 02/07/2020 09/27/2019 06/15/2019 05/17/2019 02/03/2019 12/14/2018 07/21/2018  Systolic BP 106 106 138 110 110 110 128  Diastolic BP 69 69 80 70 70 70 78  Wt. (Lbs) 165 165.12 172.4 163 163 163 175  BMI 30.18 30.2 31.53 28.87 28.87 28.87 31        Hyperlipemia Hyperlipidemia:Low fat diet discussed and encouraged.   Lipid Panel  Lab Results  Component Value Date   CHOL 161 09/27/2019   HDL 58 09/27/2019   LDLCALC 87 09/27/2019   TRIG 73 09/27/2019   CHOLHDL 2.8 09/27/2019       Osteoarthritis of right knee Home safety discussed for fall prevention  Follow Up Instructions:    I discussed the assessment and treatment plan with the patient. The patient was provided an opportunity to ask questions and all were answered. The patient agreed with the plan and demonstrated an understanding of the instructions.   The patient was advised to call back or seek an in-person evaluation if the symptoms worsen or if the condition fails to improve as anticipated.  I provided 20  minutes of non-face-to-face time during this encounter.   Syliva Overman, MD

## 2020-03-27 NOTE — Patient Instructions (Addendum)
F/U in office with MD end August, call if you need me before  Please start chair exercises regularly to help with joint stiffness.  Please continue to use Tylenol up to no more than 4/day for arthritic pain.  Please continue to use your topical rubs on the painful joints.  5 days of prednisone is prescribed for help with the stiffness now.  Continue to be careful not to fall.  Fasting lipid CMP and EGFR in the next 1 to 2 weeks.Please   Exercises to do While Sitting  Exercises that you do while sitting (chair exercises) can give you many of the same benefits as full exercise. Benefits include strengthening your heart, burning calories, and keeping muscles and joints healthy. Exercise can also improve your mood and help with depression and anxiety. You may benefit from chair exercises if you are unable to do standing exercises because of:  Diabetic foot pain.  Obesity.  Illness.  Arthritis.  Recovery from surgery or injury.  Breathing problems.  Balance problems.  Another type of disability. Before starting chair exercises, check with your health care provider or a physical therapist to find out how much exercise you can tolerate and which exercises are safe for you. If your health care provider approves:  Start out slowly and build up over time. Aim to work up to about 10-20 minutes for each exercise session.  Make exercise part of your daily routine.  Drink water when you exercise. Do not wait until you are thirsty. Drink every 10-15 minutes.  Stop exercising right away if you have pain, nausea, shortness of breath, or dizziness.  If you are exercising in a wheelchair, make sure to lock the wheels.  Ask your health care provider whether you can do tai chi or yoga. Many positions in these mind-body exercises can be modified to do while seated. Warm-up Before starting other exercises: 1. Sit up as straight as you can. Have your knees bent at 90 degrees, which is the  shape of the capital letter "L." Keep your feet flat on the floor. 2. Sit at the front edge of your chair, if you can. 3. Pull in (tighten) the muscles in your abdomen and stretch your spine and neck as straight as you can. Hold this position for a few minutes. 4. Breathe in and out evenly. Try to concentrate on your breathing, and relax your mind. Stretching Exercise A: Arm stretch 1. Hold your arms out straight in front of your body. 2. Bend your hands at the wrist with your fingers pointing up, as if signaling someone to stop. Notice the slight tension in your forearms as you hold the position. 3. Keeping your arms out and your hands bent, rotate your hands outward as far as you can and hold this stretch. Aim to have your thumbs pointing up and your pinkie fingers pointing down. Slowly repeat arm stretches for one minute as tolerated. Exercise B: Leg stretch 1. If you can move your legs, try to "draw" letters on the floor with the toes of your foot. Write your name with one foot. 2. Write your name with the toes of your other foot. Slowly repeat the movements for one minute as tolerated. Exercise C: Reach for the sky 1. Reach your hands as far over your head as you can to stretch your spine. 2. Move your hands and arms as if you are climbing a rope. Slowly repeat the movements for one minute as tolerated. Range of motion exercises Exercise A:  Shoulder roll 1. Let your arms hang loosely at your sides. 2. Lift just your shoulders up toward your ears, then let them relax back down. 3. When your shoulders feel loose, rotate your shoulders in backward and forward circles. Do shoulder rolls slowly for one minute as tolerated. Exercise B: March in place 1. As if you are marching, pump your arms and lift your legs up and down. Lift your knees as high as you can. ? If you are unable to lift your knees, just pump your arms and move your ankles and feet up and down. March in place for one minute  as tolerated. Exercise C: Seated jumping jacks 1. Let your arms hang down straight. 2. Keeping your arms straight, lift them up over your head. Aim to point your fingers to the ceiling. 3. While you lift your arms, straighten your legs and slide your heels along the floor to your sides, as wide as you can. 4. As you bring your arms back down to your sides, slide your legs back together. ? If you are unable to use your legs, just move your arms. Slowly repeat seated jumping jacks for one minute as tolerated. Strengthening exercises Exercise A: Shoulder squeeze 1. Hold your arms straight out from your body to your sides, with your elbows bent and your fists pointed at the ceiling. 2. Keeping your arms in the bent position, move them forward so your elbows and forearms meet in front of your face. 3. Open your arms back out as wide as you can with your elbows still bent, until you feel your shoulder blades squeezing together. Hold for 5 seconds. Slowly repeat the movements forward and backward for one minute as tolerated. Contact a health care provider if you:  Had to stop exercising due to any of the following: ? Pain. ? Nausea. ? Shortness of breath. ? Dizziness. ? Fatigue.  Have significant pain or soreness after exercising. Get help right away if you have:  Chest pain.  Difficulty breathing. These symptoms may represent a serious problem that is an emergency. Do not wait to see if the symptoms will go away. Get medical help right away. Call your local emergency services (911 in the [place] Do not drive yourself to the hospital. This information is not intended to replace advice given to you by your health care provider. Make sure you discuss any questions you have with your health care provider. Document Revised: 06/30/2019 Document Reviewed: 06/30/2019 Elsevier Patient Education  2021 Show Low Prevention in the Home, Adult Falls can cause injuries and can happen to  people of all ages. There are many things you can do to make your home safe and to help prevent falls. Ask for help when making these changes. What actions can I take to prevent falls? General Instructions  Use good lighting in all rooms. Replace any light bulbs that burn out.  Turn on the lights in dark areas. Use night-lights.  Keep items that you use often in easy-to-reach places. Lower the shelves around your home if needed.  Set up your furniture so you have a clear path. Avoid moving your furniture around.  Do not have throw rugs or other things on the floor that can make you trip.  Avoid walking on wet floors.  If any of your floors are uneven, fix them.  Add color or contrast paint or tape to clearly mark and help you see: ? Grab bars or handrails. ? First and last steps  of staircases. ? Where the edge of each step is.  If you use a stepladder: ? Make sure that it is fully opened. Do not climb a closed stepladder. ? Make sure the sides of the stepladder are locked in place. ? Ask someone to hold the stepladder while you use it.  Know where your pets are when moving through your home. What can I do in the bathroom?  Keep the floor dry. Clean up any water on the floor right away.  Remove soap buildup in the tub or shower.  Use nonskid mats or decals on the floor of the tub or shower.  Attach bath mats securely with double-sided, nonslip rug tape.  If you need to sit down in the shower, use a plastic, nonslip stool.  Install grab bars by the toilet and in the tub and shower. Do not use towel bars as grab bars.      What can I do in the bedroom?  Make sure that you have a light by your bed that is easy to reach.  Do not use any sheets or blankets for your bed that hang to the floor.  Have a firm chair with side arms that you can use for support when you get dressed. What can I do in the kitchen?  Clean up any spills right away.  If you need to reach  something above you, use a step stool with a grab bar.  Keep electrical cords out of the way.  Do not use floor polish or wax that makes floors slippery. What can I do with my stairs?  Do not leave any items on the stairs.  Make sure that you have a light switch at the top and the bottom of the stairs.  Make sure that there are handrails on both sides of the stairs. Fix handrails that are broken or loose.  Install nonslip stair treads on all your stairs.  Avoid having throw rugs at the top or bottom of the stairs.  Choose a carpet that does not hide the edge of the steps on the stairs.  Check carpeting to make sure that it is firmly attached to the stairs. Fix carpet that is loose or worn. What can I do on the outside of my home?  Use bright outdoor lighting.  Fix the edges of walkways and driveways and fix any cracks.  Remove anything that might make you trip as you walk through a door, such as a raised step or threshold.  Trim any bushes or trees on paths to your home.  Check to see if handrails are loose or broken and that both sides of all steps have handrails.  Install guardrails along the edges of any raised decks and porches.  Clear paths of anything that can make you trip, such as tools or rocks.  Have leaves, snow, or ice cleared regularly.  Use sand or salt on paths during winter.  Clean up any spills in your garage right away. This includes grease or oil spills. What other actions can I take?  Wear shoes that: ? Have a low heel. Do not wear high heels. ? Have rubber bottoms. ? Feel good on your feet and fit well. ? Are closed at the toe. Do not wear open-toe sandals.  Use tools that help you move around if needed. These include: ? Canes. ? Walkers. ? Scooters. ? Crutches.  Review your medicines with your doctor. Some medicines can make you feel dizzy. This can increase  your chance of falling. Ask your doctor what else you can do to help prevent  falls. Where to find more information  Centers for Disease Control and Prevention, STEADI: http://www.wolf.info/  National Institute on Aging: http://kim-miller.com/ Contact a doctor if:  You are afraid of falling at home.  You feel weak, drowsy, or dizzy at home.  You fall at home. Summary  There are many simple things that you can do to make your home safe and to help prevent falls.  Ways to make your home safe include removing things that can make you trip and installing grab bars in the bathroom.  Ask for help when making these changes in your home. This information is not intended to replace advice given to you by your health care provider. Make sure you discuss any questions you have with your health care provider. Document Revised: 10/05/2019 Document Reviewed: 10/05/2019 Elsevier Patient Education  [place].

## 2020-03-28 NOTE — Assessment & Plan Note (Signed)
Home safety discussed for fall prevention

## 2020-03-28 NOTE — Assessment & Plan Note (Signed)
DASH diet and commitment to daily physical activity for a minimum of 30 minutes discussed and encouraged, as a part of hypertension management. The importance of attaining a healthy weight is also discussed.  BP/Weight 02/07/2020 09/27/2019 06/15/2019 05/17/2019 02/03/2019 12/14/2018 07/21/2018  Systolic BP 106 106 138 110 110 110 128  Diastolic BP 69 69 80 70 70 70 78  Wt. (Lbs) 165 165.12 172.4 163 163 163 175  BMI 30.18 30.2 31.53 28.87 28.87 28.87 31

## 2020-03-28 NOTE — Assessment & Plan Note (Signed)
Hyperlipidemia:Low fat diet discussed and encouraged.   Lipid Panel  Lab Results  Component Value Date   CHOL 161 09/27/2019   HDL 58 09/27/2019   LDLCALC 87 09/27/2019   TRIG 73 09/27/2019   CHOLHDL 2.8 09/27/2019

## 2020-04-17 ENCOUNTER — Other Ambulatory Visit: Payer: Self-pay | Admitting: Family Medicine

## 2020-04-25 DIAGNOSIS — I10 Essential (primary) hypertension: Secondary | ICD-10-CM | POA: Diagnosis not present

## 2020-04-25 DIAGNOSIS — E782 Mixed hyperlipidemia: Secondary | ICD-10-CM | POA: Diagnosis not present

## 2020-04-26 LAB — LIPID PANEL
Chol/HDL Ratio: 2.9 ratio (ref 0.0–4.4)
Cholesterol, Total: 174 mg/dL (ref 100–199)
HDL: 61 mg/dL (ref >39)
LDL Chol Calc (NIH): 99 mg/dL (ref 0–99)
Triglycerides: 73 mg/dL (ref 0–149)
VLDL Cholesterol Cal: 14 mg/dL (ref 5–40)

## 2020-04-26 LAB — CMP14+EGFR
ALT: 11 IU/L (ref 0–32)
AST: 16 IU/L (ref 0–40)
Albumin/Globulin Ratio: 1.5 (ref 1.2–2.2)
Albumin: 4 g/dL (ref 3.6–4.6)
Alkaline Phosphatase: 51 IU/L (ref 44–121)
BUN/Creatinine Ratio: 24 (ref 12–28)
BUN: 21 mg/dL (ref 8–27)
Bilirubin Total: 0.8 mg/dL (ref 0.0–1.2)
CO2: 23 mmol/L (ref 20–29)
Calcium: 10.7 mg/dL — ABNORMAL HIGH (ref 8.7–10.3)
Chloride: 102 mmol/L (ref 96–106)
Creatinine, Ser: 0.87 mg/dL (ref 0.57–1.00)
GFR calc Af Amer: 70 mL/min/{1.73_m2} (ref >59)
GFR calc non Af Amer: 61 mL/min/{1.73_m2} (ref >59)
Globulin, Total: 2.7 g/dL (ref 1.5–4.5)
Glucose: 86 mg/dL (ref 65–99)
Potassium: 4 mmol/L (ref 3.5–5.2)
Sodium: 140 mmol/L (ref 134–144)
Total Protein: 6.7 g/dL (ref 6.0–8.5)

## 2020-05-02 ENCOUNTER — Other Ambulatory Visit: Payer: Self-pay | Admitting: Family Medicine

## 2020-05-08 ENCOUNTER — Other Ambulatory Visit: Payer: Self-pay

## 2020-05-08 ENCOUNTER — Encounter: Payer: Self-pay | Admitting: "Endocrinology

## 2020-05-08 ENCOUNTER — Telehealth: Payer: Self-pay

## 2020-05-08 ENCOUNTER — Ambulatory Visit (INDEPENDENT_AMBULATORY_CARE_PROVIDER_SITE_OTHER): Payer: Medicare Other | Admitting: "Endocrinology

## 2020-05-08 NOTE — Progress Notes (Signed)
Endocrinology Consult Note       05/08/2020, 1:00 PM  Lori Stein is a 87 y.o.-year-old female, referred by her  [name], MD  , for evaluation for hypercalcemia/hyperparathyroidism.   Past Medical History:  Diagnosis Date  . Back pain   . Hyperlipidemia   . Hypertension   . OA (osteoarthritis) of knee   . Osteophyte, right knee 05/21/2017    Past Surgical History:  Procedure Laterality Date  . Bilateral tubal ligation    . CHOLECYSTECTOMY  2011  . JOINT REPLACEMENT  09/2010   left knee  . removal of mole from left neck  2004    Social History   Tobacco Use  . Smoking status: Never Smoker  . Smokeless tobacco: Never Used  Vaping Use  . Vaping Use: Never used  Substance Use Topics  . Alcohol use: No  . Drug use: No    Family History  Problem Relation Age of Onset  . Aneurysm Mother        AAA  . Heart attack Father   . Hypertension Father   . Diabetes Sister   . Diabetes Brother   . Hypertension Sister     Outpatient Encounter Medications as of 05/08/2020  Medication Sig  . Cholecalciferol (VITAMIN D3) 25 MCG (1000 UT) CAPS Take 1 capsule by mouth daily in the afternoon.  . TURMERIC PO Take 1 tablet by mouth daily in the afternoon.  Marland Kitchen acetaminophen (ARTHRITIS PAIN RELIEF) 650 MG CR tablet Take 1 tablet (650 mg total) by mouth 2 (two) times daily.  Marland Kitchen aspirin 81 MG EC tablet Take 81 mg by mouth daily.  . famotidine (PEPCID) 40 MG tablet TAKE (1) TABLET BY MOUTH ONCE DAILY AS NEEDED FOR INDIGESTION.  . hydrochlorothiazide (HYDRODIURIL) 25 MG tablet TAKE 1 TABLET BY MOUTH ONCE DAILY.  Marland Kitchen Omega-3 Fatty Acids (FISH OIL) 1000 MG CAPS Take by mouth. Take 3 caps by mouth daily  . potassium chloride (K-DUR,KLOR-CON) 10 MEQ tablet TAKE 1 TABLET BY MOUTH TWICE DAILY  . simvastatin (ZOCOR) 10 MG tablet TAKE 1 TABLET BY MOUTH AT BEDTIME FOR CHOLESTEROL   No facility-administered encounter medications  on file as of 05/08/2020.    Allergies  Allergen Reactions  . Penicillins     Has patient had a PCN reaction causing immediate rash, facial/tongue/throat swelling, SOB or lightheadedness with hypotension: Yes Has patient had a PCN reaction causing severe rash involving mucus membranes or skin necrosis: No Has patient had a PCN reaction that required hospitalization No Has patient had a PCN reaction occurring within the last 10 years: No If all of the above answers are "NO", then may proceed with Cephalosporin use.  . Sulfasalazine   . Sulfonamide Derivatives      HPI  Lori Stein was diagnosed with hypercalcemia in January 2020.  Patient has no previously known history of parathyroid, pituitary, adrenal dysfunctions; no family history of such dysfunctions. -Review of herreferral package of most recent labs reveals calcium of 10.8, no corresponding PTH.   -She never had bone density study. No prior history of fragility fractures or falls. No history of  kidney stones.  No history of CKD. Last BUN/Cr: 24/0.79  she ison HCTZ for blood pressure management.  She has history of vitamin D deficiency currently on supplement, with vitamin D3 1000 units daily. she is not on calcium supplements,  she eats dairy and green, leafy, vegetables on average amounts.  she does not have a family history of hypercalcemia, pituitary tumors, thyroid cancer, or osteoporosis.  Her other medical history glutes hyperlipidemia, hypertension well-managed.   ROS:  Constitutional: + No recent major weight change, no fatigue, no subjective hyperthermia, no subjective hypothermia Eyes: no blurry vision, no xerophthalmia ENT: no sore throat, no nodules palpated in throat, no dysphagia/odynophagia, no hoarseness Cardiovascular: no Chest Pain, no Shortness of Breath, no palpitations, no leg swelling Respiratory: no cough, no shortness of breath  Gastrointestinal: no  Nausea/Vomiting/Diarhhea Musculoskeletal: no muscle/joint aches Skin: no rashes Neurological: no tremors, no numbness, no tingling, no dizziness Psychiatric: no depression, no anxiety  PE: BP (!) 142/84   Pulse 60   Ht 5\' 2"  (1.575 m)   Wt 167 lb 6.4 oz (75.9 kg)   BMI 30.62 kg/m , Body mass index is 30.62 kg/m. Wt Readings from Last 3 Encounters:  05/08/20 167 lb 6.4 oz (75.9 kg)  02/07/20 165 lb (74.8 kg)  09/27/19 165 lb 1.9 oz (74.9 kg)    Constitutional: + BMI of 30.6,  not in acute distress, normal state of mind, she walks with a cane for equilibrium. Eyes: PERRLA, EOMI, no exophthalmos ENT: moist mucous membranes, no gross thyromegaly, no gross cervical lymphadenopathy Cardiovascular: normal precordial activity, Regular Rate and Rhythm, no Murmur/Rubs/Gallops Respiratory:  adequate breathing efforts, no gross chest deformity, Clear to auscultation bilaterally Gastrointestinal: abdomen soft, Non -tender, No distension, Bowel Sounds present Musculoskeletal: no gross deformities, strength intact in all four extremities Skin: moist, warm, no rashes Neurological: no tremor with outstretched hands, Deep tendon reflexes normal in bilateral lower extremities.     CMP ( most recent) CMP     Component Value Date/Time   NA 140 04/25/2020 1051   K 4.0 04/25/2020 1051   CL 102 04/25/2020 1051   CO2 23 04/25/2020 1051   GLUCOSE 86 04/25/2020 1051   GLUCOSE 81 09/27/2019 1125   BUN 21 04/25/2020 1051   CREATININE 0.87 04/25/2020 1051   CREATININE 0.70 09/27/2019 1125   CALCIUM 10.7 (H) 04/25/2020 1051   PROT 6.7 04/25/2020 1051   ALBUMIN 4.0 04/25/2020 1051   AST 16 04/25/2020 1051   ALT 11 04/25/2020 1051   ALKPHOS 51 04/25/2020 1051   BILITOT 0.8 04/25/2020 1051   GFRNONAA 61 04/25/2020 1051   GFRNONAA 80 09/27/2019 1125   GFRAA 70 04/25/2020 1051   GFRAA 92 09/27/2019 1125    Lipid Panel ( most recent) Lipid Panel     Component Value Date/Time   CHOL 174  04/25/2020 1051   TRIG 73 04/25/2020 1051   HDL 61 04/25/2020 1051   CHOLHDL 2.9 04/25/2020 1051   CHOLHDL 2.8 09/27/2019 1125   VLDL 11 03/26/2016 1137   LDLCALC 99 04/25/2020 1051   LDLCALC 87 09/27/2019 1125   LABVLDL 14 04/25/2020 1051     TSH of 2.57-normal on September 27, 2019.      Assessment: 1. Hypercalcemia   Plan: Patient has had several instances of elevated calcium, with the highest level being at 10.8, no measurement of corresponding PTH.  She is on 25 mg of HCTZ for hypertension management. -Patient has vitamin D deficiency currently on supplements.  -Etiology of her mild  hypercalcemia is not determined at this time.  No apparent complications from hypercalcemia/hyperparathyroidism: no history of  nephrolithiasis,  osteoporosis,fragility fractures. No abdominal pain, no major mood disorders, no bone pain.  - I discussed with the patient about the physiology of calcium and parathyroid hormone, and possible  effects of  increased PTH/ Calcium , including kidney stones, cardiac dysrhythmias, osteoporosis, abdominal pain, etc.   - The work up so far is not sufficient to reach a conclusion for definitive therapy.  she  needs more studies to confirm and classify the parathyroid dysfunction she may have. I will proceed to obtain  repeat intact PTH/calcium, PTH RP, serum magnesium, serum phosphorus.  It is also essential to obtain 24-hour urine calcium/creatinine to rule out the rare but important cause of mild elevation in calcium and PTH- FHH ( Familial Hypocalciuric Hypercalcemia), which may not require any active intervention.  -She will be considered for  DEXA scan to include the distal  33% of  radius for evaluation of cortical bone, which is predominantly affected by hyperparathyroidism.    -She is not a surgical candidate even if she is confirmed to have primary hyperparathyroidism.  She will be considered for Sensipar therapy if she is found to have significant  hypercalcemia.   She is advised to maintain close follow-up with her PMD Dr. Syliva Overman.  - Time spent with the patient: 60 minutes, of which >50% was spent in obtaining information about her symptoms, reviewing her previous labs, evaluations, and treatments, counseling her about her hypercalcemia, and developing a plan to confirm the diagnosis and long term treatment as necessary.  Please refer to " Patient Self Inventory" in the Media  tab for reviewed elements of pertinent patient history.  Ninfa Linden participated in the discussions, expressed understanding, and voiced agreement with the above plans.  All questions were answered to her satisfaction. she is encouraged to contact clinic should she have any questions or concerns prior to her return visit.  - Return in about 2 weeks (around 05/22/2020) for Labs Today- Non-Fasting Ok, 24 Hr Urine Ca & Cr.   Marquis Lunch, MD [facility] [address] [place], [place] [address] Phone: [phone]  Fax: [phone]    This note was partially dictated with voice recognition software. Similar sounding words can be transcribed inadequately or may not  be corrected upon review.  05/08/2020, 1:00 PM

## 2020-05-10 ENCOUNTER — Ambulatory Visit: Payer: Self-pay | Admitting: "Endocrinology

## 2020-05-11 LAB — CREATININE, URINE, 24 HOUR
Creatinine, 24H Ur: 548 mg/24 hr — ABNORMAL LOW (ref 800–1800)
Creatinine, Urine: 136.9 mg/dL

## 2020-05-11 LAB — CALCIUM, URINE, 24 HOUR
Calcium, 24H Urine: 49 mg/24 hr (ref 0–320)
Calcium, Urine: 12.2 mg/dL

## 2020-05-16 NOTE — Telephone Encounter (Signed)
error 

## 2020-05-19 LAB — PTH, INTACT AND CALCIUM
Calcium: 10.4 mg/dL — ABNORMAL HIGH (ref 8.7–10.3)
PTH: 57 pg/mL (ref 15–65)

## 2020-05-19 LAB — MAGNESIUM: Magnesium: 1.7 mg/dL (ref 1.6–2.3)

## 2020-05-19 LAB — PHOSPHORUS: Phosphorus: 2.7 mg/dL — ABNORMAL LOW (ref 3.0–4.3)

## 2020-05-19 LAB — PTH-RELATED PEPTIDE: PTH-related peptide: <2 pmol/L

## 2020-05-22 ENCOUNTER — Ambulatory Visit: Payer: Medicare Other | Admitting: "Endocrinology

## 2020-05-30 ENCOUNTER — Ambulatory Visit (INDEPENDENT_AMBULATORY_CARE_PROVIDER_SITE_OTHER): Payer: Medicare Other | Admitting: "Endocrinology

## 2020-05-30 ENCOUNTER — Encounter: Payer: Self-pay | Admitting: "Endocrinology

## 2020-05-30 ENCOUNTER — Other Ambulatory Visit: Payer: Self-pay

## 2020-05-30 DIAGNOSIS — I10 Essential (primary) hypertension: Secondary | ICD-10-CM | POA: Diagnosis not present

## 2020-05-30 DIAGNOSIS — E559 Vitamin D deficiency, unspecified: Secondary | ICD-10-CM

## 2020-05-30 MED ORDER — LOSARTAN POTASSIUM 50 MG PO TABS: 50.0000 mg | ORAL_TABLET | Freq: Every day | ORAL | 1 refills | Status: DC

## 2020-05-30 NOTE — Progress Notes (Signed)
05/30/2020, 12:50 PM Endocrinology follow-up note Lori Stein is a 87 y.o.-year-old female, referred by her  Fayrene Helper, MD  . She returns for follow-up after she was seen in consultation for hypercalcemia/hyperparathyroidism.    Past Medical History:  Diagnosis Date  . Back pain   . Hyperlipidemia   . Hypertension   . OA (osteoarthritis) of knee   . Osteophyte, right knee 05/21/2017    Past Surgical History:  Procedure Laterality Date  . Bilateral tubal ligation    . CHOLECYSTECTOMY  2011  . JOINT REPLACEMENT  09/2010   left knee  . removal of mole from left neck  2004    Social History   Tobacco Use  . Smoking status: Never Smoker  . Smokeless tobacco: Never Used  Vaping Use  . Vaping Use: Never used  Substance Use Topics  . Alcohol use: No  . Drug use: No    Family History  Problem Relation Age of Onset  . Aneurysm Mother        AAA  . Heart attack Father   . Hypertension Father   . Diabetes Sister   . Diabetes Brother   . Hypertension Sister     Outpatient Encounter Medications as of 05/30/2020  Medication Sig  . losartan (COZAAR) 50 MG tablet Take 1 tablet (50 mg total) by mouth daily.  Marland Kitchen acetaminophen (ARTHRITIS PAIN RELIEF) 650 MG CR tablet Take 1 tablet (650 mg total) by mouth 2 (two) times daily.  Marland Kitchen aspirin 81 MG EC tablet Take 81 mg by mouth daily.  . Cholecalciferol (VITAMIN D3) 25 MCG (1000 UT) CAPS Take 1 capsule by mouth daily in the afternoon.  . famotidine (PEPCID) 40 MG tablet TAKE (1) TABLET BY MOUTH ONCE DAILY AS NEEDED FOR INDIGESTION.  Marland Kitchen Omega-3 Fatty Acids (FISH OIL) 1000 MG CAPS Take by mouth. Take 3 caps by mouth daily  . potassium chloride (K-DUR,KLOR-CON) 10 MEQ tablet TAKE 1 TABLET BY MOUTH TWICE DAILY  . simvastatin (ZOCOR) 10 MG tablet TAKE 1 TABLET BY MOUTH AT BEDTIME FOR CHOLESTEROL  . TURMERIC PO Take 1 tablet by mouth daily in the afternoon.  .  [DISCONTINUED] hydrochlorothiazide (HYDRODIURIL) 25 MG tablet TAKE 1 TABLET BY MOUTH ONCE DAILY.   No facility-administered encounter medications on file as of 05/30/2020.    Allergies  Allergen Reactions  . Penicillins     Has patient had a PCN reaction causing immediate rash, facial/tongue/throat swelling, SOB or lightheadedness with hypotension: Yes Has patient had a PCN reaction causing severe rash involving mucus membranes or skin necrosis: No Has patient had a PCN reaction that required hospitalization No Has patient had a PCN reaction occurring within the last 10 years: No If all of the above answers are "NO", then may proceed with Cephalosporin use.  . Sulfasalazine   . Sulfonamide Derivatives      HPI  Lori Stein was diagnosed with hypercalcemia in January 2020.  Patient has no previous history of parathyroid, thyroid, adrenal dysfunction.   no family history of such dysfunctions. -Review of herreferral package of most recent labs reveals calcium of 10.8, no corresponding  PTH.  Her previsit labs show calcium slightly better at 10.4, associated with PTH of 57.  She does not have any new complaints today. -She never had bone density study. No prior history of fragility fractures or falls. No history of  kidney stones.  No history of CKD. Last BUN/Cr: 24/0.79  she ison HCTZ for blood pressure management.  She has history of vitamin D deficiency currently on supplement, with vitamin D3 1000 units daily. she is not on calcium supplements,  she eats dairy and green, leafy, vegetables on average amounts.  she does not have a family history of hypercalcemia, pituitary tumors, thyroid cancer, or osteoporosis.  Her other medical history glutes hyperlipidemia, hypertension well-managed.   ROS:  Constitutional: + No recent major weight change, no fatigue, no subjective hyperthermia, no subjective hypothermia   PE: BP 114/72   Pulse 64   Ht '5\' 2"'  (1.575 m)   Wt 165 lb  3.2 oz (74.9 kg)   BMI 30.22 kg/m , Body mass index is 30.22 kg/m. Wt Readings from Last 3 Encounters:  05/30/20 165 lb 3.2 oz (74.9 kg)  05/08/20 167 lb 6.4 oz (75.9 kg)  02/07/20 165 lb (74.8 kg)    Constitutional: + BMI of 30.6,  not in acute distress, normal state of mind, she walks with a cane for equilibrium.    CMP ( most recent) CMP     Component Value Date/Time   NA 140 04/25/2020 1051   K 4.0 04/25/2020 1051   CL 102 04/25/2020 1051   CO2 23 04/25/2020 1051   GLUCOSE 86 04/25/2020 1051   GLUCOSE 81 09/27/2019 1125   BUN 21 04/25/2020 1051   CREATININE 0.87 04/25/2020 1051   CREATININE 0.70 09/27/2019 1125   CALCIUM 10.4 (H) 05/10/2020 1037   PROT 6.7 04/25/2020 1051   ALBUMIN 4.0 04/25/2020 1051   AST 16 04/25/2020 1051   ALT 11 04/25/2020 1051   ALKPHOS 51 04/25/2020 1051   BILITOT 0.8 04/25/2020 1051   GFRNONAA 61 04/25/2020 1051   GFRNONAA 80 09/27/2019 1125   GFRAA 70 04/25/2020 1051   GFRAA 92 09/27/2019 1125    Lipid Panel ( most recent) Lipid Panel     Component Value Date/Time   CHOL 174 04/25/2020 1051   TRIG 73 04/25/2020 1051   HDL 61 04/25/2020 1051   CHOLHDL 2.9 04/25/2020 1051   CHOLHDL 2.8 09/27/2019 1125   VLDL 11 03/26/2016 1137   LDLCALC 99 04/25/2020 1051   LDLCALC 87 09/27/2019 1125   LABVLDL 14 04/25/2020 1051     TSH of 2.57-normal on September 27, 2019.    Recent Results (from the past 2160 hour(s))  Lipid panel     Status: None   Collection Time: 04/25/20 10:51 AM  Result Value Ref Range   Cholesterol, Total 174 100 - 199 mg/dL   Triglycerides 73 0 - 149 mg/dL   HDL 61 >39 mg/dL   VLDL Cholesterol Cal 14 5 - 40 mg/dL   LDL Chol Calc (NIH) 99 0 - 99 mg/dL   Chol/HDL Ratio 2.9 0.0 - 4.4 ratio    Comment:                                   T. Chol/HDL Ratio  Men  Women                               1/2 Avg.Risk  3.4    3.3                                   Avg.Risk  5.0    4.4                                 2X Avg.Risk  9.6    7.1                                3X Avg.Risk 23.4   11.0   CMP14+EGFR     Status: Abnormal   Collection Time: 04/25/20 10:51 AM  Result Value Ref Range   Glucose 86 65 - 99 mg/dL   BUN 21 8 - 27 mg/dL   Creatinine, Ser 0.87 0.57 - 1.00 mg/dL   GFR calc non Af Amer 61 >59 mL/min/1.73   GFR calc Af Amer 70 >59 mL/min/1.73    Comment: **In accordance with recommendations from the NKF-ASN Task force,**   [facility] is in the process of updating its eGFR calculation to the   2021 CKD-EPI creatinine equation that estimates kidney function   without a race variable.    BUN/Creatinine Ratio 24 12 - 28   Sodium 140 134 - 144 mmol/L   Potassium 4.0 3.5 - 5.2 mmol/L   Chloride 102 96 - 106 mmol/L   CO2 23 20 - 29 mmol/L   Calcium 10.7 (H) 8.7 - 10.3 mg/dL   Total Protein 6.7 6.0 - 8.5 g/dL   Albumin 4.0 3.6 - 4.6 g/dL   Globulin, Total 2.7 1.5 - 4.5 g/dL   Albumin/Globulin Ratio 1.5 1.2 - 2.2   Bilirubin Total 0.8 0.0 - 1.2 mg/dL   Alkaline Phosphatase 51 44 - 121 IU/L   AST 16 0 - 40 IU/L   ALT 11 0 - 32 IU/L  PTH, intact and calcium     Status: Abnormal   Collection Time: 05/10/20 10:37 AM  Result Value Ref Range   Calcium 10.4 (H) 8.7 - 10.3 mg/dL   PTH 57 15 - 65 pg/mL   PTH Interp Comment     Comment: Interpretation                 Intact PTH    Calcium                                 (pg/mL)      (mg/dL) Normal                          15 - 65     8.6 - 10.2 Primary Hyperparathyroidism         >65          >10.2 Secondary Hyperparathyroidism       >65          <10.2 Non-Parathyroid Hypercalcemia       <65          >10.2 Hypoparathyroidism                  <  15          < 8.6 Non-Parathyroid Hypocalcemia    15 - 65          < 8.6   Magnesium     Status: None   Collection Time: 05/10/20 10:37 AM  Result Value Ref Range   Magnesium 1.7 1.6 - 2.3 mg/dL  Phosphorus     Status: Abnormal   Collection Time: 05/10/20 10:37 AM  Result  Value Ref Range   Phosphorus 2.7 (L) 3.0 - 4.3 mg/dL  PTH-related peptide     Status: None   Collection Time: 05/10/20 10:37 AM  Result Value Ref Range   PTH-related peptide <2.0 pmol/L    Comment: This test was developed and its performance characteristics determined by [facility]. It has not been cleared or approved by the Food and Drug Administration. Reference Range: All Ages: <2.0 The PTHrP assay should not be used to exclude cancer or screen tumor patients for humoral hypercalcemia of malignancy (HHM). The results should always be assessed in conjunction with the patient's medical history, clinical examination, and other findings. If test results are clinically discordant, please contact the laboratory.   Calcium, urine, 24 hour     Status: None   Collection Time: 05/10/20 10:48 AM  Result Value Ref Range   Calcium, Urine 12.2 Not Estab. mg/dL   Calcium, 24H Urine 49 0 - 320 mg/24 hr  Creatinine, urine, 24 hour     Status: Abnormal   Collection Time: 05/10/20 10:49 AM  Result Value Ref Range   Creatinine, Urine 136.9 Not Estab. mg/dL   Creatinine, 24H Ur 548 (L) 800 - 1,800 mg/24 hr      Assessment: 1. Hypercalcemia  2.  Vitamin D deficiency 3.  Hypertension  Plan: I reviewed her previous, and previsit labs.  Patient has had several instances of elevated calcium, with the highest level being at 10.8, repeat labs shows calcium 10.4 associated with PTH of 57.  Her 24-hour urine calcium was not elevated, at 49-likely inadequate urine sample. Primary hyperparathyroidism as a cause of hypercalcemia is not confirmed.  She will be kept on observation only with plan to repeat PTH/calcium in 6 months with office visit.  Her hydrochlorothiazide will be changed to losartan 50 mg p.o. daily for blood pressure management. -PTH RP is low indicating absence of malignancy related hypercalcemia.  -Patient has vitamin D deficiency currently on supplements.  - No apparent complications  from hypercalcemia/hyperparathyroidism: no history of  nephrolithiasis,  osteoporosis,fragility fractures. No abdominal pain, no major mood disorders, no bone pain.   -She will be considered for  DEXA scan to include the distal  33% of  radius for evaluation of cortical bone, which is predominantly affected by hyperparathyroidism.   -She is not a surgical candidate, would not be considered for Sensipar intervention at this time either.  If she returns with clinically significant hypercalcemia, she will be considered for Sensipar therapy.   She is advised to maintain close follow-up with her PMD Dr. Tula Nakayama.    - Time spent on this patient care encounter:  30 minutes of which 50% was spent in  counseling and the rest reviewing  her current and  previous labs / studies and medications  doses and developing a plan for long term care, and documenting this care. Kirstie Mirza  participated in the discussions, expressed understanding, and voiced agreement with the above plans.  All questions were answered to her satisfaction. she is encouraged to contact clinic  should she have any questions or concerns prior to her return visit.   - Return in about 6 months (around 11/30/2020) for F/U with Pre-visit Labs.   Glade Lloyd, MD [facility] [address] [place], [place] [address] Phone: [phone]  Fax: [phone]    This note was partially dictated with voice recognition software. Similar sounding words can be transcribed inadequately or may not  be corrected upon review.  05/30/2020, 12:50 PM

## 2020-06-06 ENCOUNTER — Other Ambulatory Visit: Payer: Self-pay | Admitting: Family Medicine

## 2020-07-25 ENCOUNTER — Other Ambulatory Visit: Payer: Self-pay | Admitting: Family Medicine

## 2020-08-11 ENCOUNTER — Other Ambulatory Visit: Payer: Self-pay | Admitting: "Endocrinology

## 2020-10-03 DIAGNOSIS — Z23 Encounter for immunization: Secondary | ICD-10-CM | POA: Diagnosis not present

## 2020-10-08 ENCOUNTER — Other Ambulatory Visit: Payer: Self-pay | Admitting: Family Medicine

## 2020-10-24 ENCOUNTER — Ambulatory Visit: Payer: Medicare Other | Admitting: Family Medicine

## 2020-10-30 ENCOUNTER — Encounter: Payer: Self-pay | Admitting: Family Medicine

## 2020-10-30 ENCOUNTER — Other Ambulatory Visit: Payer: Self-pay

## 2020-10-30 ENCOUNTER — Ambulatory Visit (INDEPENDENT_AMBULATORY_CARE_PROVIDER_SITE_OTHER): Payer: Medicare Other | Admitting: Family Medicine

## 2020-10-30 VITALS — BP 165/75 | HR 68 | Temp 98.6°F | Resp 18 | Ht 63.0 in | Wt 164.0 lb

## 2020-10-30 DIAGNOSIS — F4321 Adjustment disorder with depressed mood: Secondary | ICD-10-CM

## 2020-10-30 DIAGNOSIS — E782 Mixed hyperlipidemia: Secondary | ICD-10-CM | POA: Diagnosis not present

## 2020-10-30 DIAGNOSIS — H6120 Impacted cerumen, unspecified ear: Secondary | ICD-10-CM | POA: Insufficient documentation

## 2020-10-30 DIAGNOSIS — E663 Overweight: Secondary | ICD-10-CM | POA: Diagnosis not present

## 2020-10-30 DIAGNOSIS — I10 Essential (primary) hypertension: Secondary | ICD-10-CM | POA: Diagnosis not present

## 2020-10-30 DIAGNOSIS — H6123 Impacted cerumen, bilateral: Secondary | ICD-10-CM

## 2020-10-30 MED ORDER — LOSARTAN POTASSIUM 25 MG PO TABS: 25.0000 mg | ORAL_TABLET | Freq: Every day | ORAL | 1 refills | Status: DC

## 2020-10-30 MED ORDER — LOSARTAN POTASSIUM 50 MG PO TABS: 50.0000 mg | ORAL_TABLET | Freq: Every day | ORAL | 1 refills | Status: DC

## 2020-10-30 NOTE — Assessment & Plan Note (Signed)
Bilateral impaction, successful ear flush by nursing, no adverse effects

## 2020-10-30 NOTE — Patient Instructions (Addendum)
F/U for flu vaccine and re eval bP in 2 months, call if you need me sooner  New additional bP medication is losartan 25 mg one daily, take every morning along with losratan 50 mg , so TOTAL of 75 mg every morning for bP  Please get shingrix vaccines at your pharmacy  Bilateral ear flush in office today  Please get your TdaP vaccine at your pharmacy  Continue evening medication simvastatin 10 mg for cholesterol  Please get fasting lipid, cmp and eGFr 3 to 5 days before your next visit  Sorry about your recent loss.  May you have m,any more birthdays, HAPPY BIRTHDAY!!!  Thanks for choosing [facility], we consider it a privelige to serve you.

## 2020-10-30 NOTE — Assessment & Plan Note (Signed)
Hyperlipidemia:Low fat diet discussed and encouraged.   Lipid Panel  Lab Results  Component Value Date   CHOL 174 04/25/2020   HDL 61 04/25/2020   LDLCALC 99 04/25/2020   TRIG 73 04/25/2020   CHOLHDL 2.9 04/25/2020   Controlled, no change in medication Updated lab needed at/ before next visit.

## 2020-10-30 NOTE — Assessment & Plan Note (Signed)
Uncontrolled add cozaar 25 mg , continue 50 mg tab DASH diet and commitment to daily physical activity for a minimum of 30 minutes discussed and encouraged, as a part of hypertension management. The importance of attaining a healthy weight is also discussed.  BP/Weight 10/30/2020 05/30/2020 05/08/2020 02/07/2020 09/27/2019 06/15/2019 05/17/2019  Systolic BP 165 114 142 106 106 138 110  Diastolic BP 75 72 84 69 69 80 70  Wt. (Lbs) 164 165.2 167.4 165 165.12 172.4 163  BMI 29.05 30.22 30.62 30.18 30.2 31.53 28.87

## 2020-10-30 NOTE — Assessment & Plan Note (Signed)
  Patient re-educated about  the importance of commitment to a  minimum of 150 minutes of exercise per week as able.  The importance of healthy food choices with portion control discussed, as well as eating regularly and within a 12 hour window most days. The need to choose "clean , green" food 50 to 75% of the time is discussed, as well as to make water the primary drink and set a goal of 64 ounces water daily.    Weight /BMI 10/30/2020 05/30/2020 05/08/2020  WEIGHT 164 lb 165 lb 3.2 oz 167 lb 6.4 oz  HEIGHT 5\' 3"  5\' 2"  5\' 2"   BMI 29.05 kg/m2 30.22 kg/m2 30.62 kg/m2

## 2020-10-30 NOTE — Progress Notes (Signed)
Lori Stein     MRN: [id number]      DOB: 12-Nov-1932   HPI Lori Stein is here for follow up and re-evaluation of chronic medical conditions, medication management and review of any available recent lab and radiology data.  Preventive health is updated, specifically  Cancer screening and Immunization.   Endo stopped HCTZ and started cozaar 50 mg to see effect on calcium, No adverse s/e noted  Currently grieving as lost her last sibling 2 weeks ago, poor sleep and feels sad C/o bilateral ear pressure, left worse than right   ROS Denies recent fever or chills. Denies sinus pressure, nasal congestion,  or sore throat. Denies chest congestion, productive cough or wheezing. Denies chest pains, palpitations and leg swelling Denies abdominal pain, nausea, vomiting,diarrhea or constipation.   Denies dysuria, frequency, hesitancy or incontinence. Denies uncontrolled  joint pain, swelling and limitation in mobility. Denies headaches, seizures, numbness, or tingling. Denies skin break down or rash.   PE  BP (!) 165/75 (BP Location: Right Arm, Patient Position: Sitting, Cuff Size: Large)   Pulse 68   Temp 98.6 F (37 C)   Resp 18   Ht 5\' 3"  (1.6 m)   Wt 164 lb (74.4 kg)   SpO2 93%   BMI 29.05 kg/m   Patient alert and oriented and in no cardiopulmonary distress.  HEENT: No facial asymmetry, EOMI,     Neck supple .Bilateral cerumen impaction  Chest: Clear to auscultation bilaterally.  CVS: S1, S2 no murmurs, no S3.Regular rate.  ABD: Soft non tender.   Ext: No edema  MS: decreased  ROM spine, shoulders, hips and knees.  Skin: Intact, no ulcerations or rash noted.  Psych: poor  eye contact, flat  affect. Memory intact not anxious or depressed appearing.  CNS: CN 2-12 intact, power,  normal throughout.no focal deficits noted.   Assessment & Plan   Essential hypertension, benign Uncontrolled add cozaar 25 mg , continue 50 mg tab DASH diet and commitment to daily  physical activity for a minimum of 30 minutes discussed and encouraged, as a part of hypertension management. The importance of attaining a healthy weight is also discussed.  BP/Weight 10/30/2020 05/30/2020 05/08/2020 02/07/2020 09/27/2019 06/15/2019 05/17/2019  Systolic BP 165 114 142 106 106 138 110  Diastolic BP 75 72 84 69 69 80 70  Wt. (Lbs) 164 165.2 167.4 165 165.12 172.4 163  BMI 29.05 30.22 30.62 30.18 30.2 31.53 28.87       Overweight (BMI 25.0-29.9)  Patient re-educated about  the importance of commitment to a  minimum of 150 minutes of exercise per week as able.  The importance of healthy food choices with portion control discussed, as well as eating regularly and within a 12 hour window most days. The need to choose "clean , green" food 50 to 75% of the time is discussed, as well as to make water the primary drink and set a goal of 64 ounces water daily.    Weight /BMI 10/30/2020 05/30/2020 05/08/2020  WEIGHT 164 lb 165 lb 3.2 oz 167 lb 6.4 oz  HEIGHT 5\' 3"  5\' 2"  5\' 2"   BMI 29.05 kg/m2 30.22 kg/m2 30.62 kg/m2      Cerumen impaction Bilateral impaction, successful ear flush by nursing, no adverse effects  Hyperlipemia Hyperlipidemia:Low fat diet discussed and encouraged.   Lipid Panel  Lab Results  Component Value Date   CHOL 174 04/25/2020   HDL 61 04/25/2020   LDLCALC 99 04/25/2020   TRIG  73 04/25/2020   CHOLHDL 2.9 04/25/2020   Controlled, no change in medication Updated lab needed at/ before next visit.     Grief reaction Currently grieving recent loss of her sibling 2 weeks ago, verbalized for 10 minutes , no interest in therapy currently

## 2020-10-30 NOTE — Assessment & Plan Note (Signed)
Currently grieving recent loss of her sibling 2 weeks ago, verbalized for 10 minutes , no interest in therapy currently

## 2020-11-23 ENCOUNTER — Telehealth: Payer: Self-pay

## 2020-11-23 NOTE — Telephone Encounter (Signed)
Called pt to remind her to get her labs done for appt next Friday, patient advised she is not coming back

## 2020-11-30 ENCOUNTER — Ambulatory Visit: Payer: Medicare Other | Admitting: "Endocrinology

## 2020-12-27 DIAGNOSIS — I10 Essential (primary) hypertension: Secondary | ICD-10-CM | POA: Diagnosis not present

## 2020-12-27 DIAGNOSIS — E782 Mixed hyperlipidemia: Secondary | ICD-10-CM | POA: Diagnosis not present

## 2020-12-28 LAB — CMP14+EGFR
ALT: 9 IU/L (ref 0–32)
AST: 11 IU/L (ref 0–40)
Albumin/Globulin Ratio: 1.4 (ref 1.2–2.2)
Albumin: 4.1 g/dL (ref 3.6–4.6)
Alkaline Phosphatase: 58 IU/L (ref 44–121)
BUN/Creatinine Ratio: 21 (ref 12–28)
BUN: 15 mg/dL (ref 8–27)
Bilirubin Total: 0.8 mg/dL (ref 0.0–1.2)
CO2: 24 mmol/L (ref 20–29)
Calcium: 10.6 mg/dL — ABNORMAL HIGH (ref 8.7–10.3)
Chloride: 105 mmol/L (ref 96–106)
Creatinine, Ser: 0.7 mg/dL (ref 0.57–1.00)
Globulin, Total: 3 g/dL (ref 1.5–4.5)
Glucose: 82 mg/dL (ref 70–99)
Potassium: 4.3 mmol/L (ref 3.5–5.2)
Sodium: 142 mmol/L (ref 134–144)
Total Protein: 7.1 g/dL (ref 6.0–8.5)
eGFR: 84 mL/min/{1.73_m2} (ref >59)

## 2020-12-28 LAB — LIPID PANEL
Chol/HDL Ratio: 2.7 ratio (ref 0.0–4.4)
Cholesterol, Total: 144 mg/dL (ref 100–199)
HDL: 54 mg/dL (ref >39)
LDL Chol Calc (NIH): 77 mg/dL (ref 0–99)
Triglycerides: 66 mg/dL (ref 0–149)
VLDL Cholesterol Cal: 13 mg/dL (ref 5–40)

## 2021-01-01 ENCOUNTER — Encounter (INDEPENDENT_AMBULATORY_CARE_PROVIDER_SITE_OTHER): Payer: Self-pay

## 2021-01-01 ENCOUNTER — Other Ambulatory Visit: Payer: Self-pay

## 2021-01-01 ENCOUNTER — Ambulatory Visit (INDEPENDENT_AMBULATORY_CARE_PROVIDER_SITE_OTHER): Payer: Medicare Other | Admitting: Family Medicine

## 2021-01-01 ENCOUNTER — Encounter: Payer: Self-pay | Admitting: Family Medicine

## 2021-01-01 VITALS — BP 138/70 | HR 69 | Resp 16 | Ht 63.0 in | Wt 164.0 lb

## 2021-01-01 DIAGNOSIS — I10 Essential (primary) hypertension: Secondary | ICD-10-CM | POA: Diagnosis not present

## 2021-01-01 DIAGNOSIS — E782 Mixed hyperlipidemia: Secondary | ICD-10-CM

## 2021-01-01 DIAGNOSIS — E663 Overweight: Secondary | ICD-10-CM | POA: Diagnosis not present

## 2021-01-01 DIAGNOSIS — M1711 Unilateral primary osteoarthritis, right knee: Secondary | ICD-10-CM

## 2021-01-01 DIAGNOSIS — Z23 Encounter for immunization: Secondary | ICD-10-CM

## 2021-01-01 DIAGNOSIS — N3944 Nocturnal enuresis: Secondary | ICD-10-CM | POA: Diagnosis not present

## 2021-01-01 NOTE — Assessment & Plan Note (Signed)
Home safety, use of cane and fall prevention discussed

## 2021-01-01 NOTE — Assessment & Plan Note (Addendum)
Controlled, no change in medication DASH diet and commitment to daily physical activity for a minimum of 30 minutes discussed and encouraged, as a part of hypertension management. The importance of attaining a healthy weight is also discussed.  BP/Weight 01/01/2021 10/30/2020 05/30/2020 05/08/2020 02/07/2020 09/27/2019 06/15/2019  Systolic BP 138 165 114 142 106 106 138  Diastolic BP 70 75 72 84 69 69 80  Wt. (Lbs) 164 164 165.2 167.4 165 165.12 172.4  BMI 29.05 29.05 30.22 30.62 30.18 30.2 31.53

## 2021-01-01 NOTE — Assessment & Plan Note (Signed)
Hyperlipidemia:Low fat diet discussed and encouraged.   Lipid Panel  Lab Results  Component Value Date   CHOL 144 12/27/2020   HDL 54 12/27/2020   LDLCALC 77 12/27/2020   TRIG 66 12/27/2020   CHOLHDL 2.7 12/27/2020   Controlled, no change in medication

## 2021-01-01 NOTE — Progress Notes (Signed)
Lori Stein     MRN: [id number]      DOB: 07-04-1932   HPI [name] is here for follow up and re-evaluation of chronic medical conditions, medication management and review of any available recent lab and radiology data.  Preventive health is updated, specifically  Cancer screening and Immunization.   F/u of hypercalcemia needed. The PT denies any adverse reactions to current medications since the last visit.  C/o wetting herself at night when asleep requesting assistance with incontinence underwear through ins. Feels she is in deep sleep and by the time she awakens she has wet herself   ROS Denies recent fever or chills. Denies sinus pressure, nasal congestion, ear pain or sore throat. Denies chest congestion, productive cough or wheezing. Denies chest pains, palpitations and leg swelling Denies abdominal pain, nausea, vomiting,diarrhea or constipation.   Denies dysuria, frequency, hesitancy or  Chronic bilateral knee pain, swelling and limitation in mobility. Denies headaches, seizures, numbness, or tingling. Denies depression, anxiety or insomnia. Denies skin break down or rash.   PE  BP 138/70   Pulse 69   Resp 16   Ht 5\' 3"  (1.6 m)   Wt 164 lb (74.4 kg)   SpO2 93%   BMI 29.05 kg/m    Patient alert and oriented and in no cardiopulmonary distress.  HEENT: No facial asymmetry, EOMI,     Neck supple .  Chest: Clear to auscultation bilaterally.  CVS: S1, S2 no murmurs, no S3.Regular rate.  ABD: Soft non tender.   Ext: No edema  MS: Adequate  though reduced ROM spine, shoulders, hips and knees.  Skin: Intact, no ulcerations or rash noted.  Psych: Good eye contact, normal affect. Memory intact not anxious or depressed appearing.  CNS: CN 2-12 intact, power,  normal throughout.no focal deficits noted.   Assessment & Plan  Hypercalcemia Needs rept lab and endo f/u  Essential hypertension, benign Controlled, no change in medication DASH diet and  commitment to daily physical activity for a minimum of 30 minutes discussed and encouraged, as a part of hypertension management. The importance of attaining a healthy weight is also discussed.  BP/Weight 01/01/2021 10/30/2020 05/30/2020 05/08/2020 02/07/2020 09/27/2019 06/15/2019  Systolic BP 138 165 114 142 106 106 138  Diastolic BP 70 75 72 84 69 69 80  Wt. (Lbs) 164 164 165.2 167.4 165 165.12 172.4  BMI 29.05 29.05 30.22 30.62 30.18 30.2 31.53          Hyperlipemia Hyperlipidemia:Low fat diet discussed and encouraged.   Lipid Panel  Lab Results  Component Value Date   CHOL 144 12/27/2020   HDL 54 12/27/2020   LDLCALC 77 12/27/2020   TRIG 66 12/27/2020   CHOLHDL 2.7 12/27/2020   Controlled, no change in medication     Overweight (BMI 25.0-29.9)  Patient re-educated about  the importance of commitment to a  minimum of 150 minutes of exercise per week as able.  The importance of healthy food choices with portion control discussed, as well as eating regularly and within a 12 hour window most days. The need to choose "clean , green" food 50 to 75% of the time is discussed, as well as to make water the primary drink and set a goal of 64 ounces water daily.    Weight /BMI 01/01/2021 10/30/2020 05/30/2020  WEIGHT 164 lb 164 lb 165 lb 3.2 oz  HEIGHT 5\' 3"  5\' 3"  5\' 2"   BMI 29.05 kg/m2 29.05 kg/m2 30.22 kg/m2  Osteoarthritis of right knee Home safety, use of cane and fall prevention discussed

## 2021-01-01 NOTE — Assessment & Plan Note (Signed)
Needs rept lab and endo f/u

## 2021-01-01 NOTE — Assessment & Plan Note (Signed)
7 month h/o worsening bed wetting, increased frequency. Requests and needs incontinence underwear for bedtime use, will attempt to get through insurance

## 2021-01-01 NOTE — Patient Instructions (Addendum)
F/U in 5 months, CALL IF YOU NEED ME SOONER  fLU VACCINE TODAY   PTH TODAY, REPORT TO DR NIDA PLEASE  PLEASE KEEP F/U APPT WITH DR NIDA Excellent cholesterol, kidney and liver function  Be careful not to fall  Thanks for choosing [facility], we consider it a privelige to serve you.

## 2021-01-01 NOTE — Assessment & Plan Note (Signed)
  Patient re-educated about  the importance of commitment to a  minimum of 150 minutes of exercise per week as able.  The importance of healthy food choices with portion control discussed, as well as eating regularly and within a 12 hour window most days. The need to choose "clean , green" food 50 to 75% of the time is discussed, as well as to make water the primary drink and set a goal of 64 ounces water daily.    Weight /BMI 01/01/2021 10/30/2020 05/30/2020  WEIGHT 164 lb 164 lb 165 lb 3.2 oz  HEIGHT 5\' 3"  5\' 3"  5\' 2"   BMI 29.05 kg/m2 29.05 kg/m2 30.22 kg/m2

## 2021-01-02 LAB — PARATHYROID HORMONE, INTACT (NO CA): PTH: 58 pg/mL (ref 15–65)

## 2021-01-30 ENCOUNTER — Other Ambulatory Visit: Payer: Self-pay | Admitting: Family Medicine

## 2021-01-30 ENCOUNTER — Other Ambulatory Visit: Payer: Self-pay

## 2021-02-13 ENCOUNTER — Other Ambulatory Visit: Payer: Self-pay

## 2021-02-13 ENCOUNTER — Ambulatory Visit (INDEPENDENT_AMBULATORY_CARE_PROVIDER_SITE_OTHER): Payer: Medicare Other

## 2021-02-13 DIAGNOSIS — Z0000 Encounter for general adult medical examination without abnormal findings: Secondary | ICD-10-CM | POA: Diagnosis not present

## 2021-02-13 NOTE — Progress Notes (Signed)
Subjective:   Lori Stein is a 87 y.o. female who presents for Medicare Annual (Subsequent) preventive examination.  Review of Systems     Cardiac Risk Factors include: advanced age (>84men, >1 women);dyslipidemia     Objective:    There were no vitals filed for this visit. There is no height or weight on file to calculate BMI.  Advanced Directives 02/13/2021 02/07/2020 02/01/2018 03/28/2016  Does Patient Have a Medical Advance Directive? No No;Yes Yes Yes  Type of Advance Directive - Healthcare Power of [place];Living will Living will Healthcare Power of Attorney  Does patient want to make changes to medical advance directive? - - No - Patient declined -  Copy of Healthcare Power of Attorney in Chart? - No - copy requested - No - copy requested  Would patient like information on creating a medical advance directive? Yes (ED - Information included in AVS) - - -    Current Medications (verified) Outpatient Encounter Medications as of 02/13/2021  Medication Sig   acetaminophen (ARTHRITIS PAIN RELIEF) 650 MG CR tablet Take 1 tablet (650 mg total) by mouth 2 (two) times daily.   aspirin 81 MG EC tablet Take 81 mg by mouth daily.   Cholecalciferol (VITAMIN D3) 25 MCG (1000 UT) CAPS Take 1 capsule by mouth daily in the afternoon. (Patient not taking: Reported on 01/01/2021)   famotidine (PEPCID) 40 MG tablet TAKE (1) TABLET BY MOUTH ONCE DAILY AS NEEDED FOR INDIGESTION.   losartan (COZAAR) 25 MG tablet Take 1 tablet (25 mg total) by mouth daily.   losartan (COZAAR) 50 MG tablet Take 1 tablet (50 mg total) by mouth daily.   Omega-3 Fatty Acids (FISH OIL) 1000 MG CAPS Take by mouth. Take 3 caps by mouth daily   simvastatin (ZOCOR) 10 MG tablet TAKE 1 TABLET BY MOUTH AT BEDTIME FOR CHOLESTEROL   TURMERIC PO Take 1 tablet by mouth daily in the afternoon.   No facility-administered encounter medications on file as of 02/13/2021.    Allergies (verified) Penicillins,  Sulfasalazine, and Sulfonamide derivatives   History: Past Medical History:  Diagnosis Date   Back pain    Hyperlipidemia    Hypertension    OA (osteoarthritis) of knee    Osteophyte, right knee 05/21/2017   Past Surgical History:  Procedure Laterality Date   Bilateral tubal ligation     CHOLECYSTECTOMY  2011   JOINT REPLACEMENT  09/2010   left knee   removal of mole from left neck  2004   Family History  Problem Relation Age of Onset   Aneurysm Mother        AAA   Heart attack Father    Hypertension Father    Diabetes Sister    Diabetes Brother    Hypertension Sister    Social History   Socioeconomic History   Marital status: Widowed    Spouse name: Not on file   Number of children: 3   Years of education: 49   Highest education level: Some college, no degree  Occupational History   Occupation: Retired  Tobacco Use   Smoking status: Never   Smokeless tobacco: Never  Vaping Use   Vaping Use: Never used  Substance and Sexual Activity   Alcohol use: No   Drug use: No   Sexual activity: Not Currently  Other Topics Concern   Not on file  Social History Narrative   Lives alone, sister sometimes comes and stays with her    Social Determinants of Health  Financial Resource Strain: High Risk   Difficulty of Paying Living Expenses: Very hard  Food Insecurity: No Food Insecurity   Worried About Programme researcher, broadcasting/film/video in the Last Year: Never true   Ran Out of Food in the Last Year: Never true  Transportation Needs: No Transportation Needs   Lack of Transportation (Medical): No   Lack of Transportation (Non-Medical): No  Physical Activity: Inactive   Days of Exercise per Week: 0 days   Minutes of Exercise per Session: 0 min  Stress: Not on file  Social Connections: Moderately Isolated   Frequency of Communication with Friends and Family: Three times a week   Frequency of Social Gatherings with Friends and Family: Three times a week   Attends Religious Services:  Never   Active Member of Clubs or Organizations: Yes   Attends Banker Meetings: Never   Marital Status: Widowed    Tobacco Counseling Counseling given: Not Answered   Clinical Intake:  Pre-visit preparation completed: No  Pain : No/denies pain     Nutritional Status: BMI 25 -29 Overweight Diabetes: No  What is the last grade level you completed in school?: 12 th  Diabetic? no  Interpreter Needed?: No      Activities of Daily Living In your present state of health, do you have any difficulty performing the following activities: 02/13/2021  Hearing? N  Vision? N  Difficulty concentrating or making decisions? N  Walking or climbing stairs? Y  Dressing or bathing? N  Doing errands, shopping? Y  Preparing Food and eating ? N  Using the Toilet? N  In the past six months, have you accidently leaked urine? N  Do you have problems with loss of bowel control? N  Managing your Medications? N  Managing your Finances? N  Housekeeping or managing your Housekeeping? N  Some recent data might be hidden    Patient Care Team: [name], MD as PCP - General  Indicate any recent Medical Services you may have received from other than [facility] providers in the past year (date may be approximate).     Assessment:   This is a routine wellness examination for [facility].  Hearing/Vision screen No results found.  Dietary issues and exercise activities discussed: Current Exercise Habits: The patient does not participate in regular exercise at present   Goals Addressed               This Visit's Progress     Exercise 150 minutes per week (moderate activity)        When weather is nice- continue to get outside and work in yard and go for walks as tolerated      Patient Stated (pt-stated)        To get oil for the winter- will contact office for referral to CCM if needed      Prevent Falls   On track      Depression Screen PHQ 2/9 Scores  02/13/2021 10/30/2020 03/27/2020 02/07/2020 02/07/2020 09/27/2019 06/15/2019  PHQ - 2 Score 0 2 0 0 0 1 0  PHQ- 9 Score - 5 - - - - -    Fall Risk Fall Risk  02/13/2021 01/01/2021 10/30/2020 03/27/2020 02/07/2020  Falls in the past year? 0 0 0 0 0  Number falls in past yr: 0 0 0 0 0  Injury with Fall? 0 0 0 0 0  Risk for fall due to : - - No Fall Risks - No Fall Risks  Follow up - - Falls evaluation completed - Falls evaluation completed    FALL RISK PREVENTION PERTAINING TO THE HOME:  Any stairs in or around the home? No  If so, are there any without handrails? No  Home free of loose throw rugs in walkways, pet beds, electrical cords, etc? Yes  Adequate lighting in your home to reduce risk of falls? Yes   ASSISTIVE DEVICES UTILIZED TO PREVENT FALLS:  Life alert? No  Use of a cane, walker or w/c? Yes  Grab bars in the bathroom? Yes  Shower chair or bench in shower? Yes  Elevated toilet seat or a handicapped toilet? Yes   Cognitive Function:     6CIT Screen 02/13/2021 02/07/2020 02/03/2019 02/01/2018 01/19/2017  What Year? 0 points 0 points 0 points 0 points 0 points  What month? 0 points 0 points 0 points 0 points 0 points  What time? 0 points 0 points 0 points 0 points 0 points  Count back from 20 0 points 0 points 0 points 0 points 0 points  Months in reverse 0 points 0 points 0 points 0 points 0 points  Repeat phrase 0 points 0 points 0 points 0 points 6 points  Total Score 0 0 0 0 6    Immunizations Immunization History  Administered Date(s) Administered   Fluad Quad(high Dose 65+) 12/14/2018, 01/01/2021   Influenza Split 11/20/2011   Influenza Whole 05/12/2007, 01/03/2009, 02/19/2010, 12/11/2010   Influenza,inj,Quad PF,6+ Mos 12/27/2012, 01/13/2014, 12/18/2014, 03/26/2016, 01/19/2017, 11/30/2017   Moderna SARS-COV2 Booster Vaccination 10/03/2020   Moderna Sars-Covid-2 Vaccination 05/02/2019, 06/01/2019, 02/29/2020, 12/29/2020   Pneumococcal Conjugate-13 10/20/2013    Pneumococcal Polysaccharide-23 11/23/2003   Td 11/23/2003    TDAP status: Due, Education has been provided regarding the importance of this vaccine. Advised may receive this vaccine at local pharmacy or Health Dept. Aware to provide a copy of the vaccination record if obtained from local pharmacy or Health Dept. Verbalized acceptance and understanding.  Flu Vaccine status: Up to date  Pneumococcal vaccine status: Up to date  Covid-19 vaccine status: Completed vaccines  Qualifies for Shingles Vaccine? Yes   Zostavax completed Yes   Shingrix Completed?: Yes  Screening Tests Health Maintenance  Topic Date Due   Zoster Vaccines- Shingrix (1 of 2) Never done   TETANUS/TDAP  11/22/2013   DEXA SCAN  03/27/2021 (Originally 10/22/1999)   COVID-19 Vaccine (5 - Booster for Moderna series) 02/23/2021   Pneumonia Vaccine 39+ Years old  Completed   INFLUENZA VACCINE  Completed   HPV VACCINES  Aged Out    Health Maintenance  Health Maintenance Due  Topic Date Due   Zoster Vaccines- Shingrix (1 of 2) Never done   TETANUS/TDAP  11/22/2013    Colorectal cancer screening: No longer required.   Mammogram status: No longer required due to age.  Bone Density status: Completed yes. Results reflect: Bone density results: OSTEOPENIA. Repeat every 0 years.  Lung Cancer Screening: (Low Dose CT Chest recommended if Age 77-80 years, 30 pack-year currently smoking OR have quit w/in 15years.) does not qualify.   Lung Cancer Screening Referral: na  Additional Screening:  Hepatitis C Screening: does qualify; Completed once  Vision Screening: Recommended annual ophthalmology exams for early detection of glaucoma and other disorders of the eye. Is the patient up to date with their annual eye exam?  Yes  Who is the provider or what is the name of the office in which the patient attends annual eye exams? [facility] If pt is  not established with a provider, would they like to be referred to a provider  to establish care? No .   Dental Screening: Recommended annual dental exams for proper oral hygiene  Community Resource Referral / Chronic Care Management: CRR required this visit?  No   CCM required this visit?  No      Plan:     I have personally reviewed and noted the following in the patient's chart:   Medical and social history Use of alcohol, tobacco or illicit drugs  Current medications and supplements including opioid prescriptions.  Functional ability and status Nutritional status Physical activity Advanced directives List of other physicians Hospitalizations, surgeries, and ER visits in previous 12 months Vitals Screenings to include cognitive, depression, and falls Referrals and appointments  In addition, I have reviewed and discussed with patient certain preventive protocols, quality metrics, and best practice recommendations. A written personalized care plan for preventive services as well as general preventive health recommendations were provided to patient.     Everitt Amber, LPN, LPN   30/09/6224   Nurse Notes:  Ms. Yetman , Thank you for taking time to come for your Medicare Wellness Visit. I appreciate your ongoing commitment to your health goals. Please review the following plan we discussed and let me know if I can assist you in the future.   These are the goals we discussed:  Goals       Exercise 150 minutes per week (moderate activity)      When weather is nice- continue to get outside and work in yard and go for walks as tolerated      Patient Stated (pt-stated)      To get oil for the winter- will contact office for referral to CCM if needed      Prevent Falls        This is a list of the screening recommended for you and due dates:  Health Maintenance  Topic Date Due   Zoster (Shingles) Vaccine (1 of 2) Never done   Tetanus Vaccine  11/22/2013   DEXA scan (bone density measurement)  03/27/2021*   COVID-19 Vaccine (5 - Booster for  Moderna series) 02/23/2021   Pneumonia Vaccine  Completed   Flu Shot  Completed   HPV Vaccine  Aged Out  *Topic was postponed. The date shown is not the original due date.

## 2021-02-13 NOTE — Patient Instructions (Addendum)
Lori Stein , Thank you for taking time to come for your Medicare Wellness Visit. I appreciate your ongoing commitment to your health goals. Please review the following plan we discussed and let me know if I can assist you in the future.   These are the goals we discussed:  Goals       Exercise 150 minutes per week (moderate activity)      When weather is nice- continue to get outside and work in yard and go for walks as tolerated      Patient Stated (pt-stated)      To get oil for the winter- will contact office for referral to CCM if needed      Prevent Falls        This is a list of the screening recommended for you and due dates:  Health Maintenance  Topic Date Due   Zoster (Shingles) Vaccine (1 of 2) Never done   Tetanus Vaccine  11/22/2013   DEXA scan (bone density measurement)  03/27/2021*   COVID-19 Vaccine (5 - Booster for Moderna series) 02/23/2021   Pneumonia Vaccine  Completed   Flu Shot  Completed   HPV Vaccine  Aged Out  *Topic was postponed. The date shown is not the original due date.      Health Maintenance, Female Adopting a healthy lifestyle and getting preventive care are important in promoting health and wellness. Ask your health care provider about: The right schedule for you to have regular tests and exams. Things you can do on your own to prevent diseases and keep yourself healthy. What should I know about diet, weight, and exercise? Eat a healthy diet  Eat a diet that includes plenty of vegetables, fruits, low-fat dairy products, and lean protein. Do not eat a lot of foods that are high in solid fats, added sugars, or sodium. Maintain a healthy weight Body mass index (BMI) is used to identify weight problems. It estimates body fat based on height and weight. Your health care provider can help determine your BMI and help you achieve or maintain a healthy weight. Get regular exercise Get regular exercise. This is one of the most important things you  can do for your health. Most adults should: Exercise for at least 150 minutes each week. The exercise should increase your heart rate and make you sweat (moderate-intensity exercise). Do strengthening exercises at least twice a week. This is in addition to the moderate-intensity exercise. Spend less time sitting. Even light physical activity can be beneficial. Watch cholesterol and blood lipids Have your blood tested for lipids and cholesterol at 85 years of age, then have this test every 5 years. Have your cholesterol levels checked more often if: Your lipid or cholesterol levels are high. You are older than 85 years of age. You are at high risk for heart disease. What should I know about cancer screening? Depending on your health history and family history, you may need to have cancer screening at various ages. This may include screening for: Breast cancer. Cervical cancer. Colorectal cancer. Skin cancer. Lung cancer. What should I know about heart disease, diabetes, and high blood pressure? Blood pressure and heart disease High blood pressure causes heart disease and increases the risk of stroke. This is more likely to develop in people who have high blood pressure readings or are overweight. Have your blood pressure checked: Every 3-5 years if you are 32-61 years of age. Every year if you are 80 years old or older. Diabetes  Have regular diabetes screenings. This checks your fasting blood sugar level. Have the screening done: Once every three years after age 80 if you are at a normal weight and have a low risk for diabetes. More often and at a younger age if you are overweight or have a high risk for diabetes. What should I know about preventing infection? Hepatitis B If you have a higher risk for hepatitis B, you should be screened for this virus. Talk with your health care provider to find out if you are at risk for hepatitis B infection. Hepatitis C Testing is recommended  for: Everyone born from [birth year] through 1963. Anyone with known risk factors for hepatitis C. Sexually transmitted infections (STIs) Get screened for STIs, including gonorrhea and chlamydia, if: You are sexually active and are younger than 85 years of age. You are older than 85 years of age and your health care provider tells you that you are at risk for this type of infection. Your sexual activity has changed since you were last screened, and you are at increased risk for chlamydia or gonorrhea. Ask your health care provider if you are at risk. Ask your health care provider about whether you are at high risk for HIV. Your health care provider may recommend a prescription medicine to help prevent HIV infection. If you choose to take medicine to prevent HIV, you should first get tested for HIV. You should then be tested every 3 months for as long as you are taking the medicine. Pregnancy If you are about to stop having your period (premenopausal) and you may become pregnant, seek counseling before you get pregnant. Take 400 to 800 micrograms (mcg) of folic acid every day if you become pregnant. Ask for birth control (contraception) if you want to prevent pregnancy. Osteoporosis and menopause Osteoporosis is a disease in which the bones lose minerals and strength with aging. This can result in bone fractures. If you are 80 years old or older, or if you are at risk for osteoporosis and fractures, ask your health care provider if you should: Be screened for bone loss. Take a calcium or vitamin D supplement to lower your risk of fractures. Be given hormone replacement therapy (HRT) to treat symptoms of menopause. Follow these instructions at home: Alcohol use Do not drink alcohol if: Your health care provider tells you not to drink. You are pregnant, may be pregnant, or are planning to become pregnant. If you drink alcohol: Limit how much you have to: 0-1 drink a day. Know how much alcohol is in  your drink. In the [place] one drink equals one 12 oz bottle of beer (355 mL), one 5 oz glass of wine (148 mL), or one 1 oz glass of hard liquor (44 mL). Lifestyle Do not use any products that contain nicotine or tobacco. These products include cigarettes, chewing tobacco, and vaping devices, such as e-cigarettes. If you need help quitting, ask your health care provider. Do not use street drugs. Do not share needles. Ask your health care provider for help if you need support or information about quitting drugs. General instructions Schedule regular health, dental, and eye exams. Stay current with your vaccines. Tell your health care provider if: You often feel depressed. You have ever been abused or do not feel safe at home. Summary Adopting a healthy lifestyle and getting preventive care are important in promoting health and wellness. Follow your health care provider's instructions about healthy diet, exercising, and getting tested or screened for diseases.  Follow your health care provider's instructions on monitoring your cholesterol and blood pressure. This information is not intended to replace advice given to you by your health care provider. Make sure you discuss any questions you have with your health care provider. Document Revised: 07/23/2020 Document Reviewed: 07/23/2020 Elsevier Patient Education  2022 [employer].

## 2021-04-17 ENCOUNTER — Other Ambulatory Visit: Payer: Self-pay | Admitting: Family Medicine

## 2021-05-22 ENCOUNTER — Telehealth: Payer: Self-pay | Admitting: Family Medicine

## 2021-05-22 ENCOUNTER — Other Ambulatory Visit: Payer: Self-pay

## 2021-05-22 MED ORDER — LOSARTAN POTASSIUM 50 MG PO TABS: 50.0000 mg | ORAL_TABLET | Freq: Every day | ORAL | 1 refills | Status: DC

## 2021-05-22 MED ORDER — LOSARTAN POTASSIUM 25 MG PO TABS: 25.0000 mg | ORAL_TABLET | Freq: Every day | ORAL | 1 refills | Status: DC

## 2021-05-22 NOTE — Telephone Encounter (Signed)
error 

## 2021-06-04 ENCOUNTER — Other Ambulatory Visit: Payer: Self-pay

## 2021-06-04 ENCOUNTER — Ambulatory Visit (INDEPENDENT_AMBULATORY_CARE_PROVIDER_SITE_OTHER): Payer: Medicare Other | Admitting: Family Medicine

## 2021-06-04 ENCOUNTER — Encounter: Payer: Self-pay | Admitting: Family Medicine

## 2021-06-04 ENCOUNTER — Encounter (INDEPENDENT_AMBULATORY_CARE_PROVIDER_SITE_OTHER): Payer: Self-pay

## 2021-06-04 VITALS — BP 134/82 | HR 64 | Resp 16 | Ht 63.0 in | Wt 164.8 lb

## 2021-06-04 DIAGNOSIS — I10 Essential (primary) hypertension: Secondary | ICD-10-CM | POA: Diagnosis not present

## 2021-06-04 DIAGNOSIS — E559 Vitamin D deficiency, unspecified: Secondary | ICD-10-CM | POA: Diagnosis not present

## 2021-06-04 DIAGNOSIS — E663 Overweight: Secondary | ICD-10-CM | POA: Diagnosis not present

## 2021-06-04 DIAGNOSIS — E782 Mixed hyperlipidemia: Secondary | ICD-10-CM

## 2021-06-04 DIAGNOSIS — M1711 Unilateral primary osteoarthritis, right knee: Secondary | ICD-10-CM

## 2021-06-04 MED ORDER — TEMAZEPAM 7.5 MG PO CAPS: ORAL_CAPSULE | ORAL | 1 refills | Status: DC

## 2021-06-04 MED ORDER — FAMOTIDINE 40 MG PO TABS: ORAL_TABLET | ORAL | 1 refills | Status: DC

## 2021-06-04 NOTE — Assessment & Plan Note (Signed)
Controlled, no change in medication ?DASH diet and commitment to daily physical activity for a minimum of 30 minutes discussed and encouraged, as a part of hypertension management. ?The importance of attaining a healthy weight is also discussed. ? ?BP/Weight 06/04/2021 01/01/2021 10/30/2020 05/30/2020 05/08/2020 02/07/2020 09/27/2019  ?Systolic BP 134 138 165 114 142 106 106  ?Diastolic BP 82 70 75 72 84 69 69  ?Wt. (Lbs) 164.8 164 164 165.2 167.4 165 165.12  ?BMI 29.19 29.05 29.05 30.22 30.62 30.18 30.2  ? ? ? ? ?

## 2021-06-04 NOTE — Assessment & Plan Note (Signed)
Home safety and fall risk reduction discussed, she is to continue to use cane for support and safety ?

## 2021-06-04 NOTE — Progress Notes (Signed)
? ?Lori Stein     MRN: [id number]      DOB: [birth year] ? ? ?HPI ?[name] is here for follow up and re-evaluation of chronic medical conditions, medication management and review of any available recent lab and radiology data.  ?Preventive health is updated, specifically  Cancer screening and Immunization.   ?Questions or concerns regarding consultations or procedures which the PT has had in the interim are  addressed. ?The PT denies any adverse reactions to current medications since the last visit.  ?There are no new concerns.  ?There are no specific complaints  ? ?ROS ?Denies recent fever or chills. ?Denies sinus pressure, nasal congestion, ear pain or sore throat. ?Denies chest congestion, productive cough or wheezing. ?Denies chest pains, palpitations and leg swelling ?Denies abdominal pain, nausea, vomiting,diarrhea or constipation.   ?Denies dysuria, frequency, hesitancy or incontinence. ?C/o  joint pain, swelling and limitation in mobility. ?Denies headaches, seizures, numbness, or tingling. ?Denies depression, anxiety or insomnia. ?Denies skin break down or rash. ? ? ?PE ? ?BP 134/82   Pulse 64   Resp 16   Ht 5\' 3"  (1.6 m)   Wt 164 lb 12.8 oz (74.8 kg)   SpO2 94%   BMI 29.19 kg/m?  ? ?Patient alert and oriented and in no cardiopulmonary distress. ? ?HEENT: No facial asymmetry, EOMI,     Neck supple . ? ?Chest: Clear to auscultation bilaterally. ? ?CVS: S1, S2 no murmurs, no S3.Regular rate. ? ?ABD: Soft non tender.  ? ?Ext: No edema ? ?MS: Adequate ROM spine, shoulders, hips and  reduced in right knee. ? ?Skin: Intact, no ulcerations or rash noted. ? ?Psych: Good eye contact, normal affect. Memory intact not anxious or depressed appearing. ? ?CNS: CN 2-12 intact, power,  normal throughout.no focal deficits noted. ? ? ?Assessment & Plan ? ?Essential hypertension, benign ?Controlled, no change in medication ?DASH diet and commitment to daily physical activity for a minimum of 30 minutes  discussed and encouraged, as a part of hypertension management. ?The importance of attaining a healthy weight is also discussed. ? ?BP/Weight 06/04/2021 01/01/2021 10/30/2020 05/30/2020 05/08/2020 02/07/2020 09/27/2019  ?Systolic BP 134 138 165 114 142 106 106  ?Diastolic BP 82 70 75 72 84 69 69  ?Wt. (Lbs) 164.8 164 164 165.2 167.4 165 165.12  ?BMI 29.19 29.05 29.05 30.22 30.62 30.18 30.2  ? ? ? ? ? ?Hyperlipemia ?Hyperlipidemia:Low fat diet discussed and encouraged. ? ? ?Lipid Panel  ?Lab Results  ?Component Value Date  ? CHOL 144 12/27/2020  ? HDL 54 12/27/2020  ? LDLCALC 77 12/27/2020  ? TRIG 66 12/27/2020  ? CHOLHDL 2.7 12/27/2020  ? ? ? ?Updated lab needed at/ before next visit. ? ? ?Overweight (BMI 25.0-29.9) ? ?Patient re-educated about  the importance of commitment to a  minimum of 150 minutes of exercise per week as able. ? ?The importance of healthy food choices with portion control discussed, as well as eating regularly and within a 12 hour window most days. ?The need to choose "clean , green" food 50 to 75% of the time is discussed, as well as to make water the primary drink and set a goal of 64 ounces water daily. ? ?  ?Weight /BMI 06/04/2021 01/01/2021 10/30/2020  ?WEIGHT 164 lb 12.8 oz 164 lb 164 lb  ?HEIGHT 5\' 3"  5\' 3"  5\' 3"   ?BMI 29.19 kg/m2 29.05 kg/m2 29.05 kg/m2  ? ? ? ? ?Osteoarthritis of right knee ?Home safety and fall risk reduction discussed,  she is to continue to use cane for support and safety ? ?

## 2021-06-04 NOTE — Patient Instructions (Addendum)
F/U mid to end October, call  if you need me before ? ?Excellent blood pressure ? ?Use cane and be careful not to fall ? ?CBC, lipid, cmp and EGFr, TSH and vit D today ? ?It is important that you exercise regularly at least 30 minutes 5 times a week. If you develop chest pain, have severe difficulty breathing, or feel very tired, stop exercising immediately and seek medical attention  ? ? ? ?Think about what you will eat, plan ahead. ?Choose " clean, green, fresh or frozen" over canned, processed or packaged foods which are more sugary, salty and fatty. ?70 to 75% of food eaten should be vegetables and fruit. ?Three meals at set times with snacks allowed between meals, but they must be fruit or vegetables. ?Aim to eat over a 12 hour period , example 7 am to 7 pm, and STOP after  your last meal of the day. ?Drink water,generally about 64 ounces per day, no other drink is as healthy. Fruit juice is best enjoyed in a healthy way, by EATING the fruit. ? ?Thanks for choosing [facility], we consider it a privelige to serve you. ? ?

## 2021-06-04 NOTE — Assessment & Plan Note (Signed)
Hyperlipidemia:Low fat diet discussed and encouraged. ? ? ?Lipid Panel  ?Lab Results  ?Component Value Date  ? CHOL 144 12/27/2020  ? HDL 54 12/27/2020  ? LDLCALC 77 12/27/2020  ? TRIG 66 12/27/2020  ? CHOLHDL 2.7 12/27/2020  ? ? ? ?Updated lab needed at/ before next visit. ? ?

## 2021-06-04 NOTE — Assessment & Plan Note (Signed)
?  Patient re-educated about  the importance of commitment to a  minimum of 150 minutes of exercise per week as able. ? ?The importance of healthy food choices with portion control discussed, as well as eating regularly and within a 12 hour window most days. ?The need to choose "clean , green" food 50 to 75% of the time is discussed, as well as to make water the primary drink and set a goal of 64 ounces water daily. ? ?  ?Weight /BMI 06/04/2021 01/01/2021 10/30/2020  ?WEIGHT 164 lb 12.8 oz 164 lb 164 lb  ?HEIGHT 5\' 3"  5\' 3"  5\' 3"   ?BMI 29.19 kg/m2 29.05 kg/m2 29.05 kg/m2  ? ? ? ?

## 2021-06-05 LAB — CMP14+EGFR
ALT: 11 IU/L (ref 0–32)
AST: 15 IU/L (ref 0–40)
Albumin/Globulin Ratio: 1.6 (ref 1.2–2.2)
Albumin: 4.4 g/dL (ref 3.6–4.6)
Alkaline Phosphatase: 61 IU/L (ref 44–121)
BUN/Creatinine Ratio: 28 (ref 12–28)
BUN: 20 mg/dL (ref 8–27)
Bilirubin Total: 0.7 mg/dL (ref 0.0–1.2)
CO2: 25 mmol/L (ref 20–29)
Calcium: 10.6 mg/dL — ABNORMAL HIGH (ref 8.7–10.3)
Chloride: 102 mmol/L (ref 96–106)
Creatinine, Ser: 0.71 mg/dL (ref 0.57–1.00)
Globulin, Total: 2.7 g/dL (ref 1.5–4.5)
Glucose: 83 mg/dL (ref 70–99)
Potassium: 3.9 mmol/L (ref 3.5–5.2)
Sodium: 141 mmol/L (ref 134–144)
Total Protein: 7.1 g/dL (ref 6.0–8.5)
eGFR: 83 mL/min/{1.73_m2} (ref >59)

## 2021-06-05 LAB — LIPID PANEL
Chol/HDL Ratio: 2.7 ratio (ref 0.0–4.4)
Cholesterol, Total: 170 mg/dL (ref 100–199)
HDL: 63 mg/dL (ref >39)
LDL Chol Calc (NIH): 94 mg/dL (ref 0–99)
Triglycerides: 65 mg/dL (ref 0–149)
VLDL Cholesterol Cal: 13 mg/dL (ref 5–40)

## 2021-06-05 LAB — CBC
Hematocrit: 43 % (ref 34.0–46.6)
Hemoglobin: 14.3 g/dL (ref 11.1–15.9)
MCH: 29.2 pg (ref 26.6–33.0)
MCHC: 33.3 g/dL (ref 31.5–35.7)
MCV: 88 fL (ref 79–97)
Platelets: 267 10*3/uL (ref 150–450)
RBC: 4.9 x10E6/uL (ref 3.77–5.28)
RDW: 14.7 % (ref 11.7–15.4)
WBC: 7.1 10*3/uL (ref 3.4–10.8)

## 2021-06-05 LAB — TSH: TSH: 2.82 u[IU]/mL (ref 0.450–4.500)

## 2021-06-05 LAB — VITAMIN D 25 HYDROXY (VIT D DEFICIENCY, FRACTURES): Vit D, 25-Hydroxy: 26.7 ng/mL — ABNORMAL LOW (ref 30.0–100.0)

## 2021-06-07 ENCOUNTER — Other Ambulatory Visit: Payer: Self-pay

## 2021-06-07 MED ORDER — SIMVASTATIN 10 MG PO TABS: ORAL_TABLET | ORAL | 3 refills | Status: DC

## 2021-07-18 ENCOUNTER — Other Ambulatory Visit: Payer: Self-pay | Admitting: Family Medicine

## 2021-07-22 ENCOUNTER — Telehealth: Payer: Self-pay | Admitting: Family Medicine

## 2021-07-22 MED ORDER — DOXEPIN HCL 3 MG PO TABS
ORAL_TABLET | ORAL | 5 refills | Status: DC
Start: 2021-07-22 — End: 2022-07-10

## 2021-07-22 NOTE — Telephone Encounter (Signed)
Pls let her know insprefers her to try doxepin for sleep, and will not cover the restoril, soI am sending in doxepin 3 mg for her to try that med in it's place, it ois for sleep , let me know if not working for her ?

## 2021-07-23 NOTE — Telephone Encounter (Signed)
LMTRC

## 2021-07-23 NOTE — Telephone Encounter (Signed)
Patient aware.

## 2021-08-06 ENCOUNTER — Other Ambulatory Visit: Payer: Self-pay | Admitting: Family Medicine

## 2021-12-31 ENCOUNTER — Encounter: Payer: Self-pay | Admitting: Family Medicine

## 2021-12-31 ENCOUNTER — Other Ambulatory Visit: Payer: Self-pay

## 2021-12-31 ENCOUNTER — Ambulatory Visit (INDEPENDENT_AMBULATORY_CARE_PROVIDER_SITE_OTHER): Payer: Medicare Other | Admitting: Family Medicine

## 2021-12-31 VITALS — BP 144/82 | HR 49 | Ht 63.0 in | Wt 162.0 lb

## 2021-12-31 DIAGNOSIS — E782 Mixed hyperlipidemia: Secondary | ICD-10-CM | POA: Diagnosis not present

## 2021-12-31 DIAGNOSIS — Z23 Encounter for immunization: Secondary | ICD-10-CM

## 2021-12-31 DIAGNOSIS — M1711 Unilateral primary osteoarthritis, right knee: Secondary | ICD-10-CM | POA: Diagnosis not present

## 2021-12-31 DIAGNOSIS — E663 Overweight: Secondary | ICD-10-CM

## 2021-12-31 DIAGNOSIS — R351 Nocturia: Secondary | ICD-10-CM

## 2021-12-31 DIAGNOSIS — I10 Essential (primary) hypertension: Secondary | ICD-10-CM | POA: Diagnosis not present

## 2021-12-31 DIAGNOSIS — Z780 Asymptomatic menopausal state: Secondary | ICD-10-CM

## 2021-12-31 LAB — POCT URINALYSIS DIP (CLINITEK)
Bilirubin, UA: NEGATIVE
Glucose, UA: NEGATIVE mg/dL
Ketones, POC UA: NEGATIVE mg/dL
Nitrite, UA: NEGATIVE
POC PROTEIN,UA: NEGATIVE
Spec Grav, UA: 1.025 (ref 1.010–1.025)
Urobilinogen, UA: 0.2 E.U./dL
pH, UA: 6 (ref 5.0–8.0)

## 2021-12-31 MED ORDER — HYDROCHLOROTHIAZIDE 25 MG PO TABS: 25.0000 mg | ORAL_TABLET | Freq: Every day | ORAL | 5 refills | Status: DC

## 2021-12-31 NOTE — Assessment & Plan Note (Signed)
BP,nneed to address after medication she is actually taking is clarified

## 2021-12-31 NOTE — Patient Instructions (Addendum)
F/u in 5 weeks, re evaluate blood pressure, call if you need me sooner  Flu vaccine today  Lipid CMP and EGFR today.  Please schedule DEXA at checkout.  Blood pressure is elevated. I have refilled hCYZ which you need to control this, please continue takng the medications that you are currently are taking [facility] in office due to nocturia  You need your COVID-vaccine please get this in the following week.  You need Tdap vaccine please get this at your pharmacy also.  Thanks for choosing [facility], we consider it a privelige to serve you.

## 2021-12-31 NOTE — Assessment & Plan Note (Signed)
Hyperlipidemia:Low fat diet discussed and encouraged.   Lipid Panel  Lab Results  Component Value Date   CHOL 170 06/04/2021   HDL 63 06/04/2021   LDLCALC 94 06/04/2021   TRIG 65 06/04/2021   CHOLHDL 2.7 06/04/2021

## 2021-12-31 NOTE — Progress Notes (Unsigned)
   Lori Stein     MRN: [id number]      DOB: February 25, 1933   HPI [name] is here for follow up and re-evaluation of chronic medical conditions, medication management and review of any available recent lab and radiology data.  Preventive health is updated, specifically  Cancer screening and Immunization.   Questions or concerns regarding consultations or procedures which the PT has had in the interim are  addressed. The PT denies any adverse reactions to current medications since the last visit.  C/o excess urination at night ROS Denies recent fever or chills. Denies sinus pressure, nasal congestion, ear pain or sore throat. Denies chest congestion, productive cough or wheezing. Denies chest pains, palpitations and leg swelling Denies abdominal pain, nausea, vomiting,diarrhea or constipation.   Denies dysuria, frequency, hesitancy or incontinence. Denies joint pain, swelling and limitation in mobility. Denies headaches, seizures, numbness, or tingling. Denies depression, anxiety or insomnia. Denies skin break down or rash.   PE  BP (!) 144/82   Pulse (!) 49   Ht 5\' 3"  (1.6 m)   Wt 162 lb 0.6 oz (73.5 kg)   SpO2 95%   BMI 28.70 kg/m   Patient alert and oriented and in no cardiopulmonary distress.  HEENT: No facial asymmetry, EOMI,     Neck supple .  Chest: Clear to auscultation bilaterally.  CVS: S1, S2 no murmurs, no S3.Regular rate.  ABD: Soft non tender.   Ext: No edema  MS: Adequate ROM spine, shoulders, hips and knees.  Skin: Intact, no ulcerations or rash noted.  Psych: Good eye contact, normal affect. Memory intact not anxious or depressed appearing.  CNS: CN 2-12 intact, power,  normal throughout.no focal deficits noted.   Assessment & Plan  ***

## 2022-01-01 ENCOUNTER — Encounter: Payer: Self-pay | Admitting: Family Medicine

## 2022-01-01 DIAGNOSIS — R351 Nocturia: Secondary | ICD-10-CM | POA: Insufficient documentation

## 2022-01-01 LAB — CMP14+EGFR
ALT: 11 IU/L (ref 0–32)
AST: 11 IU/L (ref 0–40)
Albumin/Globulin Ratio: 1.5 (ref 1.2–2.2)
Albumin: 4.5 g/dL (ref 3.7–4.7)
Alkaline Phosphatase: 69 IU/L (ref 44–121)
BUN/Creatinine Ratio: 23 (ref 12–28)
BUN: 16 mg/dL (ref 8–27)
Bilirubin Total: 0.7 mg/dL (ref 0.0–1.2)
CO2: 25 mmol/L (ref 20–29)
Calcium: 10.9 mg/dL — ABNORMAL HIGH (ref 8.7–10.3)
Chloride: 102 mmol/L (ref 96–106)
Creatinine, Ser: 0.71 mg/dL (ref 0.57–1.00)
Globulin, Total: 3 g/dL (ref 1.5–4.5)
Glucose: 78 mg/dL (ref 70–99)
Potassium: 3.7 mmol/L (ref 3.5–5.2)
Sodium: 142 mmol/L (ref 134–144)
Total Protein: 7.5 g/dL (ref 6.0–8.5)
eGFR: 82 mL/min/{1.73_m2} (ref >59)

## 2022-01-01 LAB — LIPID PANEL
Chol/HDL Ratio: 2.8 ratio (ref 0.0–4.4)
Cholesterol, Total: 174 mg/dL (ref 100–199)
HDL: 62 mg/dL (ref >39)
LDL Chol Calc (NIH): 98 mg/dL (ref 0–99)
Triglycerides: 77 mg/dL (ref 0–149)
VLDL Cholesterol Cal: 14 mg/dL (ref 5–40)

## 2022-01-01 NOTE — Assessment & Plan Note (Signed)
CCUA is abn send for c/s

## 2022-01-01 NOTE — Assessment & Plan Note (Signed)
  Patient re-educated about  the importance of commitment to a  minimum of 150 minutes of exercise per week as able.  The importance of healthy food choices with portion control discussed, as well as eating regularly and within a 12 hour window most days. The need to choose "clean , green" food 50 to 75% of the time is discussed, as well as to make water the primary drink and set a goal of 64 ounces water daily.       12/31/2021    1:42 PM 06/04/2021    2:43 PM 01/01/2021    2:47 PM  Weight /BMI  Weight 162 lb 0.6 oz 164 lb 12.8 oz 164 lb  Height 5\' 3"  (1.6 m) 5\' 3"  (1.6 m) 5\' 3"  (1.6 m)  BMI 28.7 kg/m2 29.19 kg/m2 29.05 kg/m2

## 2022-01-01 NOTE — Assessment & Plan Note (Signed)
Ambulates safely with a cane ,  Hoe safety discussed

## 2022-01-02 LAB — URINE CULTURE

## 2022-01-08 ENCOUNTER — Ambulatory Visit (HOSPITAL_COMMUNITY)
Admission: RE | Admit: 2022-01-08 | Discharge: 2022-01-08 | Disposition: A | Discharge status: Home / Self Care | Payer: Medicare Other | Source: Ambulatory Visit | Attending: Family Medicine | Admitting: Family Medicine

## 2022-01-08 DIAGNOSIS — M8589 Other specified disorders of bone density and structure, multiple sites: Secondary | ICD-10-CM | POA: Diagnosis not present

## 2022-01-08 DIAGNOSIS — Z780 Asymptomatic menopausal state: Secondary | ICD-10-CM | POA: Diagnosis not present

## 2022-01-16 ENCOUNTER — Encounter: Payer: Self-pay | Admitting: Family Medicine

## 2022-02-05 ENCOUNTER — Ambulatory Visit (INDEPENDENT_AMBULATORY_CARE_PROVIDER_SITE_OTHER): Payer: Medicare Other | Admitting: Family Medicine

## 2022-02-05 ENCOUNTER — Encounter: Payer: Self-pay | Admitting: Family Medicine

## 2022-02-05 VITALS — BP 120/77 | HR 56 | Ht 63.0 in | Wt 161.0 lb

## 2022-02-05 DIAGNOSIS — E782 Mixed hyperlipidemia: Secondary | ICD-10-CM

## 2022-02-05 DIAGNOSIS — E559 Vitamin D deficiency, unspecified: Secondary | ICD-10-CM | POA: Diagnosis not present

## 2022-02-05 DIAGNOSIS — I10 Essential (primary) hypertension: Secondary | ICD-10-CM

## 2022-02-05 DIAGNOSIS — M1711 Unilateral primary osteoarthritis, right knee: Secondary | ICD-10-CM | POA: Diagnosis not present

## 2022-02-05 NOTE — Patient Instructions (Addendum)
Annual wellness visit is due December 1 or after, please reschedule for a sooner appointment than February 2024 where it currently is if possible F/U IN 5 months call if you need me sooner.  Blood pressure is excellent stay on current medications as listed.  Your calcium level is slightly high you do not need additional calcium as a supplement for your thinning bones.  Continue vitamin D as you are taking it now once daily.  Please get COVID vaccine in the next several weeks as soon as it is possible  Cbc, FASTING LIPID, CMP AND EgfR,  AND VIT d, 3 TO 7 DAYS BEFORE NEXT APPT  Thanks for choosing [facility], we consider it a privelige to serve you.  BEST FOR THE SEASON AND 2024!

## 2022-02-07 ENCOUNTER — Encounter: Payer: Self-pay | Admitting: Family Medicine

## 2022-02-07 DIAGNOSIS — Z23 Encounter for immunization: Secondary | ICD-10-CM | POA: Diagnosis not present

## 2022-02-07 NOTE — Assessment & Plan Note (Signed)
No falls reported. Home safety and fall risk reduction discussed

## 2022-02-07 NOTE — Assessment & Plan Note (Signed)
Hyperlipidemia:Low fat diet discussed and encouraged.   Lipid Panel  Lab Results  Component Value Date   CHOL 174 12/31/2021   HDL 62 12/31/2021   LDLCALC 98 12/31/2021   TRIG 77 12/31/2021   CHOLHDL 2.8 12/31/2021     Controlled, no change in medication

## 2022-02-07 NOTE — Progress Notes (Signed)
   Lori Stein     MRN: [id number]      DOB: 07/07/1932   HPI Lori Stein is here for follow up and re-evaluation of chronic medical conditions, medication management and review of any available recent lab and radiology data.  Preventive health is updated, specifically  Cancer screening and Immunization.   Questions or concerns regarding consultations or procedures which the PT has had in the interim are  addressed. The PT denies any adverse reactions to current medications since the last visit.  There are no new concerns.  There are no specific complaints   ROS Denies recent fever or chills. Denies sinus pressure, nasal congestion, ear pain or sore throat. Denies chest congestion, productive cough or wheezing. Denies chest pains, palpitations and leg swelling Denies abdominal pain, nausea, vomiting,diarrhea or constipation.   Denies dysuria, frequency, hesitancy or incontinence. Chronic  joint pain,  and limitation in mobility. Denies headaches, seizures, numbness, or tingling. Denies depression, anxiety or insomnia. Denies skin break down or rash.   PE  BP 120/77 (BP Location: Right Arm, Patient Position: Sitting, Cuff Size: Large)   Pulse (!) 56   Ht 5\' 3"  (1.6 m)   Wt 161 lb (73 kg)   SpO2 92%   BMI 28.52 kg/m   Patient alert and oriented and in no cardiopulmonary distress.  HEENT: No facial asymmetry, EOMI,     Neck supple .  Chest: Clear to auscultation bilaterally.  CVS: S1, S2 no murmurs, no S3.Regular rate.  ABD: Soft non tender.   Ext: No edema  MS: decreased  ROM spine, shoulders, hips and knees.  Skin: Intact, no ulcerations or rash noted.  Psych: Good eye contact, normal affect. Memory intact not anxious or depressed appearing.  CNS: CN 2-12 intact, power,  normal throughout.no focal deficits noted.   Assessment & Plan  Essential hypertension, benign Controlled, no change in medication DASH diet and commitment to daily physical activity  for a minimum of 30 minutes discussed and encouraged, as a part of hypertension management. The importance of attaining a healthy weight is also discussed.     02/05/2022    1:31 PM 12/31/2021    2:11 PM 12/31/2021    1:45 PM 12/31/2021    1:42 PM 06/04/2021    3:08 PM 06/04/2021    2:43 PM 01/01/2021   10:47 PM  BP/Weight  Systolic BP 120 144 158 156 134 153 138  Diastolic BP 77 82 62 79 82 76 70  Wt. (Lbs) 161   162.04  164.8   BMI 28.52 kg/m2   28.7 kg/m2  29.19 kg/m2        Hyperlipemia Hyperlipidemia:Low fat diet discussed and encouraged.   Lipid Panel  Lab Results  Component Value Date   CHOL 174 12/31/2021   HDL 62 12/31/2021   LDLCALC 98 12/31/2021   TRIG 77 12/31/2021   CHOLHDL 2.8 12/31/2021     Controlled, no change in medication   Hypercalcemia Stop additional calcium supplement  Osteoarthritis of right knee No falls reported. Home safety and fall risk reduction discussed

## 2022-02-07 NOTE — Assessment & Plan Note (Signed)
Stop additional calcium supplement

## 2022-02-07 NOTE — Assessment & Plan Note (Signed)
Controlled, no change in medication DASH diet and commitment to daily physical activity for a minimum of 30 minutes discussed and encouraged, as a part of hypertension management. The importance of attaining a healthy weight is also discussed.     02/05/2022    1:31 PM 12/31/2021    2:11 PM 12/31/2021    1:45 PM 12/31/2021    1:42 PM 06/04/2021    3:08 PM 06/04/2021    2:43 PM 01/01/2021   10:47 PM  BP/Weight  Systolic BP 120 144 158 156 134 153 138  Diastolic BP 77 82 62 79 82 76 70  Wt. (Lbs) 161   162.04  164.8   BMI 28.52 kg/m2   28.7 kg/m2  29.19 kg/m2

## 2022-02-11 ENCOUNTER — Encounter: Payer: Self-pay | Admitting: Internal Medicine

## 2022-02-11 ENCOUNTER — Ambulatory Visit (INDEPENDENT_AMBULATORY_CARE_PROVIDER_SITE_OTHER): Payer: Medicare Other | Admitting: Internal Medicine

## 2022-02-11 DIAGNOSIS — Z0000 Encounter for general adult medical examination without abnormal findings: Secondary | ICD-10-CM

## 2022-02-11 DIAGNOSIS — R12 Heartburn: Secondary | ICD-10-CM | POA: Diagnosis not present

## 2022-02-11 MED ORDER — FAMOTIDINE 40 MG PO TABS: ORAL_TABLET | ORAL | 2 refills | Status: DC

## 2022-02-11 NOTE — Patient Instructions (Signed)
  Lori Stein , Thank you for taking time to come for your Medicare Wellness Visit. I appreciate your ongoing commitment to your health goals. Please review the following plan we discussed and let me know if I can assist you in the future.   These are the goals we discussed: Patient is going to continue strengthen her faith. She is going to continue to walk to mailbox twice a day.     This is a list of the screening recommended for you and due dates:  Health Maintenance  Topic Date Due   COVID-19 Vaccine (6 - 2023-24 season) 11/15/2021   Medicare Annual Wellness Visit  02/13/2022   Pneumonia Vaccine  Completed   Flu Shot  Completed   DEXA scan (bone density measurement)  Completed   Zoster (Shingles) Vaccine  Completed   HPV Vaccine  Aged Out

## 2022-02-11 NOTE — Progress Notes (Signed)
Subjective:  This is a telephone encounter between Lori Stein and Lori Stein on 02/11/2022 for AWV. The visit was conducted with the patient located at home and Lori Stein at [facility] The patient's identity was confirmed using their DOB and current address. The patient has consented to being evaluated through a telephone encounter and understands the associated risks (an examination cannot be done and the patient may need to come in for an appointment) / benefits (allows the patient to remain at home, decreasing exposure to coronavirus).     Lori Stein is a 88 y.o. female who presents for Medicare Annual (Subsequent) preventive examination.  Review of Systems    Review of Systems  All other systems reviewed and are negative.    Objective:    There were no vitals filed for this visit. There is no height or weight on file to calculate BMI.     02/13/2021   11:13 AM 02/07/2020    1:10 PM 02/01/2018   10:37 AM 03/28/2016    1:11 PM  Advanced Directives  Does Patient Have a Medical Advance Directive? No No;Yes Yes Yes  Type of Special educational needs teacher of [place];Living will Living will Healthcare Power of Attorney  Does patient want to make changes to medical advance directive?   No - Patient declined   Copy of Healthcare Power of Attorney in Chart?  No - copy requested  No - copy requested  Would patient like information on creating a medical advance directive? Yes (ED - Information included in AVS)       Current Medications (verified) Outpatient Encounter Medications as of 02/11/2022  Medication Sig   acetaminophen (ARTHRITIS PAIN RELIEF) 650 MG CR tablet Take 1 tablet (650 mg total) by mouth 2 (two) times daily.   aspirin 81 MG EC tablet Take 81 mg by mouth daily.   Cholecalciferol (VITAMIN D3) 25 MCG (1000 UT) CAPS Take 1 capsule by mouth daily in the afternoon.   Doxepin HCl 3 MG TABS Take one tablet at bedtime  for sleep   famotidine (PEPCID)  40 MG tablet TAKE (1) TABLET BY MOUTH ONCE DAILY AS NEEDED FOR INDIGESTION.   hydrochlorothiazide (HYDRODIURIL) 25 MG tablet Take 1 tablet (25 mg total) by mouth daily.   losartan (COZAAR) 25 MG tablet TAKE 1 TABLET BY MOUTH ONCE DAILY.   Omega-3 Fatty Acids (FISH OIL) 1000 MG CAPS Take by mouth.   simvastatin (ZOCOR) 10 MG tablet Take one tablet by mouth at BEDTIME for cholesterol   [DISCONTINUED] TURMERIC PO Take 1 tablet by mouth daily in the afternoon.   No facility-administered encounter medications on file as of 02/11/2022.    Allergies (verified) Penicillins, Sulfasalazine, and Sulfonamide derivatives   History: Past Medical History:  Diagnosis Date   Back pain    Hyperlipidemia    Hypertension    OA (osteoarthritis) of knee    Osteophyte, right knee 05/21/2017   Past Surgical History:  Procedure Laterality Date   Bilateral tubal ligation     CHOLECYSTECTOMY  2011   JOINT REPLACEMENT  09/2010   left knee   removal of mole from left neck  2004   Family History  Problem Relation Age of Onset   Aneurysm Mother        AAA   Heart attack Father    Hypertension Father    Diabetes Sister    Diabetes Brother    Hypertension Sister    Social History   Socioeconomic History  Marital status: Widowed    Spouse name: Not on file   Number of children: 3   Years of education: 44   Highest education level: Some college, no degree  Occupational History   Occupation: Retired  Tobacco Use   Smoking status: Never   Smokeless tobacco: Never  Vaping Use   Vaping Use: Never used  Substance and Sexual Activity   Alcohol use: No   Drug use: No   Sexual activity: Not Currently  Other Topics Concern   Not on file  Social History Narrative   Lives alone, sister sometimes comes and stays with her    Social Determinants of Health   Financial Resource Strain: High Risk (02/13/2021)   Overall Financial Resource Strain (CARDIA)    Difficulty of Paying Living Expenses: Very  hard  Food Insecurity: No Food Insecurity (02/13/2021)   Hunger Vital Sign    Worried About Running Out of Food in the Last Year: Never true    Ran Out of Food in the Last Year: Never true  Transportation Needs: No Transportation Needs (02/13/2021)   PRAPARE - Administrator, Civil Service (Medical): No    Lack of Transportation (Non-Medical): No  Physical Activity: Inactive (02/13/2021)   Exercise Vital Sign    Days of Exercise per Week: 0 days    Minutes of Exercise per Session: 0 min  Stress: No Stress Concern Present (02/07/2020)   [employer] of Occupational Health - Occupational Stress Questionnaire    Feeling of Stress : Not at all  Social Connections: Moderately Isolated (02/13/2021)   Social Connection and Isolation Panel [NHANES]    Frequency of Communication with Friends and Family: Three times a week    Frequency of Social Gatherings with Friends and Family: Three times a week    Attends Religious Services: Never    Active Member of Clubs or Organizations: Yes    Attends Banker Meetings: Never    Marital Status: Widowed    Tobacco Counseling Counseling given: Not Answered   Clinical Intake:  Pre-visit preparation completed: Yes  Pain : No/denies pain        How often do you need to have someone help you when you read instructions, pamphlets, or other written materials from your doctor or pharmacy?: 1 - Never What is the last grade level you completed in school?: 12th grade  Diabetic?no   Activities of Daily Living    02/11/2022    2:42 PM 02/13/2021   11:29 AM  In your present state of health, do you have any difficulty performing the following activities:  Hearing? 0 0  Vision? 0 0  Difficulty concentrating or making decisions? 0 0  Walking or climbing stairs? 0 1  Dressing or bathing? 0 0  Doing errands, shopping? 0 1  Preparing Food and eating ?  N  Using the Toilet?  N  In the past six months, have you  accidently leaked urine?  N  Do you have problems with loss of bowel control?  N  Managing your Medications?  N  Managing your Finances?  N  Housekeeping or managing your Housekeeping?  N    Patient Care Team: [name], MD as PCP - General  Indicate any recent Medical Services you may have received from other than [facility] providers in the past year (date may be approximate).     Assessment:   This is a routine wellness examination for [facility]  Hearing/Vision screen No results found.  Dietary issues and exercise activities discussed:     Goals Addressed   None    Depression Screen    02/11/2022    2:42 PM 02/05/2022    1:32 PM 12/31/2021    1:43 PM 02/13/2021   11:23 AM 10/30/2020    2:51 PM 03/27/2020    2:15 PM 02/07/2020    1:11 PM  PHQ 2/9 Scores  PHQ - 2 Score 0 0 0 0 2 0 0  PHQ- 9 Score     5      Fall Risk    02/11/2022    2:42 PM 02/05/2022    1:32 PM 12/31/2021    1:43 PM 06/04/2021    2:47 PM 02/13/2021   11:28 AM  Fall Risk   Falls in the past year? 1 0 1 0 0  Number falls in past yr: 0 0 0 0 0  Injury with Fall? 0 0 0 0 0  Risk for fall due to :  No Fall Risks History of fall(s)    Follow up  Falls evaluation completed Falls evaluation completed      FALL RISK PREVENTION PERTAINING TO THE HOME:  Any stairs in or around the home? Yes  If so, are there any without handrails? Yes  Home free of loose throw rugs in walkways, pet beds, electrical cords, etc? Yes  Adequate lighting in your home to reduce risk of falls? Yes   ASSISTIVE DEVICES UTILIZED TO PREVENT FALLS:  Life alert? Yes  Use of a cane, walker or w/c? Yes  Grab bars in the bathroom? Yes  Shower chair or bench in shower? Yes  Elevated toilet seat or a handicapped toilet? Yes     Cognitive Function:        02/13/2021   11:30 AM 02/07/2020    1:14 PM 02/03/2019    1:23 PM 02/01/2018   10:40 AM 01/19/2017    3:21 PM  6CIT Screen  What Year? 0 points 0 points 0  points 0 points 0 points  What month? 0 points 0 points 0 points 0 points 0 points  What time? 0 points 0 points 0 points 0 points 0 points  Count back from 20 0 points 0 points 0 points 0 points 0 points  Months in reverse 0 points 0 points 0 points 0 points 0 points  Repeat phrase 0 points 0 points 0 points 0 points 6 points  Total Score 0 points 0 points 0 points 0 points 6 points    Immunizations Immunization History  Administered Date(s) Administered   Fluad Quad(high Dose 65+) 12/14/2018, 01/01/2021, 12/31/2021   Influenza Split 11/20/2011   Influenza Whole 05/12/2007, 01/03/2009, 02/19/2010, 12/11/2010   Influenza,inj,Quad PF,6+ Mos 12/27/2012, 01/13/2014, 12/18/2014, 03/26/2016, 01/19/2017, 11/30/2017   Moderna SARS-COV2 Booster Vaccination 10/03/2020   Moderna Sars-Covid-2 Vaccination 05/02/2019, 06/01/2019, 02/29/2020, 12/29/2020   Pneumococcal Conjugate-13 10/20/2013   Pneumococcal Polysaccharide-23 11/23/2003   Td 11/23/2003   Zoster Recombinat (Shingrix) 01/23/2021, 03/26/2021    TDAP status: Due, Education has been provided regarding the importance of this vaccine. Advised may receive this vaccine at local pharmacy or Health Dept. Aware to provide a copy of the vaccination record if obtained from local pharmacy or Health Dept. Verbalized acceptance and understanding. Schedule to get from pharmacy.  Flu Vaccine status: Up to date  Pneumococcal vaccine status: Up to date  Covid-19 vaccine status: Completed vaccines Patient will bring records.   Qualifies for Shingles Vaccine? Yes   Zostavax completed No  Shingrix Completed?: Yes  Screening Tests Health Maintenance  Topic Date Due   COVID-19 Vaccine (6 - 2023-24 season) 11/15/2021   Medicare Annual Wellness (AWV)  02/13/2022   Pneumonia Vaccine 4065+ Years old  Completed   INFLUENZA VACCINE  Completed   DEXA SCAN  Completed   Zoster Vaccines- Shingrix  Completed   HPV VACCINES  Aged Out    Health  Maintenance  Health Maintenance Due  Topic Date Due   COVID-19 Vaccine (6 - 2023-24 season) 11/15/2021   Medicare Annual Wellness (AWV)  02/13/2022    Colorectal cancer screening: No longer required.   Mammogram status: No longer required due to age.  Bone Density status: Completed 01/08/2022. Results reflect: Bone density results: OSTEOPENIA.   Lung Cancer Screening: (Low Dose CT Chest recommended if Age 13-80 years, 30 pack-year currently smoking OR have quit w/in 15years.) does not qualify.     Additional Screening:  Hepatitis C Screening: does not qualify  Vision Screening: Recommended annual ophthalmology exams for early detection of glaucoma and other disorders of the eye. Is the patient up to date with their annual eye exam?  No  Who is the provider or what is the name of the office in which the patient attends annual eye exams? [facility] If pt is not established with a provider, would they like to be referred to a provider to establish care? No .   Dental Screening: Recommended annual dental exams for proper oral hygiene  Community Resource Referral / Chronic Care Management: CRR required this visit?  No   CCM required this visit?  No      Plan:     I have personally reviewed and noted the following in the patient's chart:   Medical and social history Use of alcohol, tobacco or illicit drugs  Current medications and supplements including opioid prescriptions. Patient is not currently taking opioid prescriptions. Functional ability and status Nutritional status Physical activity Advanced directives List of other physicians Hospitalizations, surgeries, and ER visits in previous 12 months Vitals Screenings to include cognitive, depression, and falls Referrals and appointments  In addition, I have reviewed and discussed with patient certain preventive protocols, quality metrics, and best practice recommendations. A written personalized care plan for  preventive services as well as general preventive health recommendations were provided to patient.     Lori BanisterJeffrey Jannet Calip, MD   02/11/2022

## 2022-07-03 DIAGNOSIS — E559 Vitamin D deficiency, unspecified: Secondary | ICD-10-CM | POA: Diagnosis not present

## 2022-07-03 DIAGNOSIS — I10 Essential (primary) hypertension: Secondary | ICD-10-CM | POA: Diagnosis not present

## 2022-07-03 DIAGNOSIS — E782 Mixed hyperlipidemia: Secondary | ICD-10-CM | POA: Diagnosis not present

## 2022-07-04 LAB — CMP14+EGFR
ALT: 12 IU/L (ref 0–32)
AST: 15 IU/L (ref 0–40)
Albumin/Globulin Ratio: 1.6 (ref 1.2–2.2)
Albumin: 4.2 g/dL (ref 3.7–4.7)
Alkaline Phosphatase: 59 IU/L (ref 44–121)
BUN/Creatinine Ratio: 28 (ref 12–28)
BUN: 18 mg/dL (ref 8–27)
Bilirubin Total: 0.7 mg/dL (ref 0.0–1.2)
CO2: 24 mmol/L (ref 20–29)
Calcium: 10.6 mg/dL — ABNORMAL HIGH (ref 8.7–10.3)
Chloride: 105 mmol/L (ref 96–106)
Creatinine, Ser: 0.64 mg/dL (ref 0.57–1.00)
Globulin, Total: 2.7 g/dL (ref 1.5–4.5)
Glucose: 80 mg/dL (ref 70–99)
Potassium: 4.4 mmol/L (ref 3.5–5.2)
Sodium: 143 mmol/L (ref 134–144)
Total Protein: 6.9 g/dL (ref 6.0–8.5)
eGFR: 85 mL/min/{1.73_m2} (ref >59)

## 2022-07-04 LAB — CBC
Hematocrit: 41.6 % (ref 34.0–46.6)
Hemoglobin: 13.5 g/dL (ref 11.1–15.9)
MCH: 29 pg (ref 26.6–33.0)
MCHC: 32.5 g/dL (ref 31.5–35.7)
MCV: 89 fL (ref 79–97)
Platelets: 244 10*3/uL (ref 150–450)
RBC: 4.66 x10E6/uL (ref 3.77–5.28)
RDW: 15.2 % (ref 11.7–15.4)
WBC: 6.5 10*3/uL (ref 3.4–10.8)

## 2022-07-04 LAB — LIPID PANEL
Chol/HDL Ratio: 2.4 ratio (ref 0.0–4.4)
Cholesterol, Total: 149 mg/dL (ref 100–199)
HDL: 61 mg/dL (ref >39)
LDL Chol Calc (NIH): 75 mg/dL (ref 0–99)
Triglycerides: 65 mg/dL (ref 0–149)
VLDL Cholesterol Cal: 13 mg/dL (ref 5–40)

## 2022-07-04 LAB — VITAMIN D 25 HYDROXY (VIT D DEFICIENCY, FRACTURES): Vit D, 25-Hydroxy: 33 ng/mL (ref 30.0–100.0)

## 2022-07-10 ENCOUNTER — Other Ambulatory Visit: Payer: Self-pay

## 2022-07-10 ENCOUNTER — Telehealth: Payer: Self-pay | Admitting: Family Medicine

## 2022-07-10 ENCOUNTER — Ambulatory Visit (INDEPENDENT_AMBULATORY_CARE_PROVIDER_SITE_OTHER): Payer: Medicare Other | Admitting: Family Medicine

## 2022-07-10 ENCOUNTER — Encounter: Payer: Self-pay | Admitting: Family Medicine

## 2022-07-10 VITALS — BP 144/70 | HR 67 | Ht 63.0 in | Wt 161.1 lb

## 2022-07-10 DIAGNOSIS — M170 Bilateral primary osteoarthritis of knee: Secondary | ICD-10-CM

## 2022-07-10 DIAGNOSIS — E782 Mixed hyperlipidemia: Secondary | ICD-10-CM | POA: Diagnosis not present

## 2022-07-10 DIAGNOSIS — I10 Essential (primary) hypertension: Secondary | ICD-10-CM | POA: Diagnosis not present

## 2022-07-10 DIAGNOSIS — E663 Overweight: Secondary | ICD-10-CM

## 2022-07-10 MED ORDER — LOSARTAN POTASSIUM 25 MG PO TABS: 25.0000 mg | ORAL_TABLET | Freq: Every day | ORAL | 11 refills | Status: DC

## 2022-07-10 NOTE — Telephone Encounter (Signed)
Patient called pharmacy not received her medicine yet for her blood pressure. Patient was seen this am.  Please call patient when sent in to save her a trip to her pharmacy.   Pharmacy  [facility] - [place], [place] - [address], [place] [address] Phone: [phone]  Fax: [phone]

## 2022-07-10 NOTE — Progress Notes (Signed)
Lori Stein     MRN: [id number]      DOB: April 16, 1932   HPI Lori Stein is here for follow up and re-evaluation of chronic medical conditions, medication management and review of any available recent lab and radiology data.  Preventive health is updated, specifically  Cancer screening and Immunization.   . The PT denies any adverse reactions to current medications since the last visit.  C/o increased pain and stiffness in knees , left worse than right, uses cane all the time, sometims  feels unsteady   ROS Denies recent fever or chills. Denies sinus pressure, nasal congestion, ear pain or sore throat. Denies chest congestion, productive cough or wheezing. Denies chest pains, palpitations and leg swelling Denies abdominal pain, nausea, vomiting,diarrhea or constipation.   Denies dysuria, frequency, hesitancyDenies headaches, seizures, numbness, or tingling. Denies depression, anxiety or insomnia. Denies skin break down or rash.   PE  BP (!) 144/70   Pulse 67   Ht 5\' 3"  (1.6 m)   Wt 161 lb 1.9 oz (73.1 kg)   SpO2 95%   BMI 28.54 kg/m   Patient alert and oriented and in no cardiopulmonary distress.  HEENT: No facial asymmetry, EOMI,     Neck decreased ROM .  Chest: Clear to auscultation bilaterally.  CVS: S1, S2 no murmurs, no S3.Regular rate.  ABD: Soft non tender.   Ext: No edema  MS: Adequate ROM spine, shoulders, hips and markedly reduced in  knees.  Skin: Intact, no ulcerations or rash noted.  Psych: Good eye contact, normal affect. Memory intact not anxious or depressed appearing.  CNS: CN 2-12 intact, power,  normal throughout.no focal deficits noted.   Assessment & Plan  Essential hypertension, benign Controlled, no change in medication DASH diet and commitment to daily physical activity for a minimum of 30 minutes discussed and encouraged, as a part of hypertension management. The importance of attaining a healthy weight is also discussed.      07/10/2022    2:10 PM 07/10/2022    1:15 PM 07/10/2022    1:12 PM 02/05/2022    1:31 PM 12/31/2021    2:11 PM 12/31/2021    1:45 PM 12/31/2021    1:42 PM  BP/Weight  Systolic BP 144 144 154 120 144 158 156  Diastolic BP 70 77 73 77 82 62 79  Wt. (Lbs)   161.12 161   162.04  BMI   28.54 kg/m2 28.52 kg/m2   28.7 kg/m2       Hyperlipemia Hyperlipidemia:Low fat diet discussed and encouraged.   Lipid Panel  Lab Results  Component Value Date   CHOL 149 07/03/2022   HDL 61 07/03/2022   LDLCALC 75 07/03/2022   TRIG 65 07/03/2022   CHOLHDL 2.4 07/03/2022     Controlled, no change in medication   Arthritis of both knees Increased pain and stiffness with instability, refer Ortho  Overweight (BMI 25.0-29.9)  Patient re-educated about  the importance of commitment to a  minimum of 150 minutes of exercise per week as able.  The importance of healthy food choices with portion control discussed, as well as eating regularly and within a 12 hour window most days. The need to choose "clean , green" food 50 to 75% of the time is discussed, as well as to make water the primary drink and set a goal of 64 ounces water daily.       07/10/2022    1:12 PM 02/05/2022    1:31 PM  12/31/2021    1:42 PM  Weight /BMI  Weight 161 lb 1.9 oz 161 lb 162 lb 0.6 oz  Height 5\' 3"  (1.6 m) 5\' 3"  (1.6 m) 5\' 3"  (1.6 m)  BMI 28.54 kg/m2 28.52 kg/m2 28.7 kg/m2

## 2022-07-10 NOTE — Telephone Encounter (Signed)
Lvm letting patient know medication sent to pharmacy

## 2022-07-10 NOTE — Patient Instructions (Addendum)
F/U in mud September, call if you need me sooner  Excellent labs  Blood pressure is slightly elevated, reduce salt and processed foods  Eat mainly vegetables and fruit and beans  You are referred to Dr Romeo Apple regarding arthritis in your knees

## 2022-07-11 ENCOUNTER — Telehealth: Payer: Self-pay

## 2022-07-11 NOTE — Telephone Encounter (Signed)
Returned patients call to reschedule appt °

## 2022-07-12 NOTE — Assessment & Plan Note (Signed)
Controlled, no change in medication DASH diet and commitment to daily physical activity for a minimum of 30 minutes discussed and encouraged, as a part of hypertension management. The importance of attaining a healthy weight is also discussed.     07/10/2022    2:10 PM 07/10/2022    1:15 PM 07/10/2022    1:12 PM 02/05/2022    1:31 PM 12/31/2021    2:11 PM 12/31/2021    1:45 PM 12/31/2021    1:42 PM  BP/Weight  Systolic BP 144 144 154 120 144 158 156  Diastolic BP 70 77 73 77 82 62 79  Wt. (Lbs)   161.12 161   162.04  BMI   28.54 kg/m2 28.52 kg/m2   28.7 kg/m2

## 2022-07-12 NOTE — Assessment & Plan Note (Signed)
Increased pain and stiffness with instability, refer Ortho

## 2022-07-12 NOTE — Assessment & Plan Note (Signed)
  Patient re-educated about  the importance of commitment to a  minimum of 150 minutes of exercise per week as able.  The importance of healthy food choices with portion control discussed, as well as eating regularly and within a 12 hour window most days. The need to choose "clean , green" food 50 to 75% of the time is discussed, as well as to make water the primary drink and set a goal of 64 ounces water daily.       07/10/2022    1:12 PM 02/05/2022    1:31 PM 12/31/2021    1:42 PM  Weight /BMI  Weight 161 lb 1.9 oz 161 lb 162 lb 0.6 oz  Height 5\' 3"  (1.6 m) 5\' 3"  (1.6 m) 5\' 3"  (1.6 m)  BMI 28.54 kg/m2 28.52 kg/m2 28.7 kg/m2

## 2022-07-12 NOTE — Assessment & Plan Note (Signed)
Hyperlipidemia:Low fat diet discussed and encouraged.   Lipid Panel  Lab Results  Component Value Date   CHOL 149 07/03/2022   HDL 61 07/03/2022   LDLCALC 75 07/03/2022   TRIG 65 07/03/2022   CHOLHDL 2.4 07/03/2022     Controlled, no change in medication

## 2022-07-22 ENCOUNTER — Telehealth: Payer: Self-pay | Admitting: Family Medicine

## 2022-07-22 NOTE — Telephone Encounter (Signed)
Patient called needs to speak to nurse about her medication needs some clarification how to take her medicine. Call back # [phone]

## 2022-07-24 ENCOUNTER — Other Ambulatory Visit: Payer: Self-pay | Admitting: Family Medicine

## 2022-07-24 ENCOUNTER — Ambulatory Visit: Payer: Medicare Other | Admitting: Orthopedic Surgery

## 2022-07-24 MED ORDER — LOSARTAN POTASSIUM 25 MG PO TABS: 25.0000 mg | ORAL_TABLET | Freq: Every day | ORAL | 3 refills | Status: DC

## 2022-07-24 NOTE — Telephone Encounter (Signed)
She wants to know if she is supposed to be taking HCTZ. It was sent to her from the pharmacy but I see it was deleted off her list at her last appt. The notes didn't say anything about it or to stop taking it though. Please advise if she is supposed to take this.

## 2022-07-24 NOTE — Telephone Encounter (Signed)
Pharmacy aware and note faxed as well

## 2022-07-26 ENCOUNTER — Other Ambulatory Visit: Payer: Self-pay | Admitting: Family Medicine

## 2022-07-31 ENCOUNTER — Other Ambulatory Visit: Payer: Self-pay | Admitting: Orthopedic Surgery

## 2022-07-31 ENCOUNTER — Encounter: Payer: Self-pay | Admitting: Orthopedic Surgery

## 2022-07-31 ENCOUNTER — Other Ambulatory Visit (INDEPENDENT_AMBULATORY_CARE_PROVIDER_SITE_OTHER): Payer: Medicare Other

## 2022-07-31 ENCOUNTER — Ambulatory Visit (INDEPENDENT_AMBULATORY_CARE_PROVIDER_SITE_OTHER): Payer: Medicare Other | Admitting: Orthopedic Surgery

## 2022-07-31 VITALS — BP 144/74 | HR 76 | Ht 63.0 in | Wt 158.0 lb

## 2022-07-31 DIAGNOSIS — G8929 Other chronic pain: Secondary | ICD-10-CM

## 2022-07-31 DIAGNOSIS — M170 Bilateral primary osteoarthritis of knee: Secondary | ICD-10-CM

## 2022-07-31 DIAGNOSIS — M25562 Pain in left knee: Secondary | ICD-10-CM | POA: Diagnosis not present

## 2022-07-31 DIAGNOSIS — Z96652 Presence of left artificial knee joint: Secondary | ICD-10-CM | POA: Diagnosis not present

## 2022-07-31 DIAGNOSIS — M25561 Pain in right knee: Secondary | ICD-10-CM

## 2022-07-31 DIAGNOSIS — M1712 Unilateral primary osteoarthritis, left knee: Secondary | ICD-10-CM

## 2022-07-31 DIAGNOSIS — M1711 Unilateral primary osteoarthritis, right knee: Secondary | ICD-10-CM

## 2022-07-31 MED ORDER — METHYLPREDNISOLONE ACETATE 40 MG/ML IJ SUSP
40.0000 mg | Freq: Once | INTRAMUSCULAR | Status: AC
  Administered 2022-07-31: 40 mg via INTRA_ARTICULAR

## 2022-07-31 NOTE — Patient Instructions (Signed)
Take 1 Tylenol every 6 hours as needed for pain  Take 1 meloxicam daily for arthritis

## 2022-07-31 NOTE — Progress Notes (Addendum)
NEW PROBLEM//OFFICE VISIT   Chief Complaint  Patient presents with   Knee Pain    Right worse than left    This is an 89 year old female presents to [place] by referral from Dr. Lodema Hong for evaluation of right knee pain which is chronic in nature.  It is intermittent.  The patient says she gets increased pain with the weather but does not hurt every day.  She says she takes 2 Tylenol a day which relieves some of her pain  She is currently using a cane she had a left total knee which was successful years ago   Meds ordered this encounter  Medications   methylPREDNISolone acetate (DEPO-MEDROL) injection 40 mg   meloxicam (MOBIC) 7.5 MG tablet    Sig: Take 1 tablet (7.5 mg total) by mouth daily.    Dispense:  30 tablet    Refill:  5     ROS: No significant chest pain shortness of breath chills fever or neurologic abnormalities  Allergies  Allergen Reactions   Penicillins     Has patient had a PCN reaction causing immediate rash, facial/tongue/throat swelling, SOB or lightheadedness with hypotension: Yes Has patient had a PCN reaction causing severe rash involving mucus membranes or skin necrosis: No Has patient had a PCN reaction that required hospitalization No Has patient had a PCN reaction occurring within the last 10 years: No If all of the above answers are "NO", then may proceed with Cephalosporin use.   Sulfasalazine    Sulfonamide Derivatives     Current Outpatient Medications  Medication Instructions   acetaminophen (ARTHRITIS PAIN RELIEF) 650 mg, Oral, 2 times daily   aspirin EC 81 mg, Oral, Daily,     Cholecalciferol (VITAMIN D3) 25 MCG (1000 UT) CAPS 1 capsule, Oral, Daily   famotidine (PEPCID) 40 MG tablet TAKE (1) TABLET BY MOUTH ONCE DAILY AS NEEDED FOR INDIGESTION.   hydrochlorothiazide (HYDRODIURIL) 25 mg, Oral, Daily   losartan (COZAAR) 25 mg, Oral, Daily   meloxicam (MOBIC) 7.5 mg, Oral, Daily   Omega-3 Fatty Acids (FISH OIL) 1000 MG CAPS Oral   simvastatin  (ZOCOR) 10 MG tablet TAKE 1 TABLET BY MOUTH AT BEDTIME FOR CHOLESTEROL       BP (!) 144/74   Pulse 76   Ht 5\' 3"  (1.6 m)   Wt 158 lb (71.7 kg)   BMI 27.99 kg/m   Body mass index is 27.99 kg/m.  General appearance: Well-developed well-nourished no gross deformities  Cardiovascular normal pulse and perfusion normal color without edema  Neurologically no sensation loss or deficits or pathologic reflexes  Psychological: Awake alert and oriented x3 mood and affect normal  Skin no lacerations or ulcerations no nodularity no palpable masses, no erythema or nodularity  Musculoskeletal: Gait supported by cane actually moves frequent fairly well for age and symptoms  Right knee swollen possible effusion tenderness medial joint line active flexion extension total arc of motion is 110 degrees from 1 10-1 20  Stability tests were normal  Imaging left total knee functioning well aligned well  Right knee severe arthritis grade 4  Assessment and plan 89 year old female with mild symptoms at this time, she says she does not want surgery  Recommend injection  Meloxicam  Tylenol 4 times a day  Return as needed  Procedure note right knee injection   verbal consent was obtained to inject right knee joint  Timeout was completed to confirm the site of injection  The medications used were depomedrol 40 mg and 1%  lidocaine 3 cc Anesthesia was provided by ethyl chloride and the skin was prepped with alcohol.  After cleaning the skin with alcohol a 20-gauge needle was used to inject the right knee joint. There were no complications. A sterile bandage was applied.       Past Medical History:  Diagnosis Date   Back pain    Hyperlipidemia    Hypertension    OA (osteoarthritis) of knee    Osteophyte, right knee 05/21/2017    Past Surgical History:  Procedure Laterality Date   Bilateral tubal ligation     CHOLECYSTECTOMY  2011   JOINT REPLACEMENT  09/2010   left knee    removal of mole from left neck  2004    Family History  Problem Relation Age of Onset   Aneurysm Mother        AAA   Heart attack Father    Hypertension Father    Diabetes Sister    Diabetes Brother    Hypertension Sister    Social History   Tobacco Use   Smoking status: Never   Smokeless tobacco: Never  Vaping Use   Vaping Use: Never used  Substance Use Topics   Alcohol use: No   Drug use: No    Allergies  Allergen Reactions   Penicillins     Has patient had a PCN reaction causing immediate rash, facial/tongue/throat swelling, SOB or lightheadedness with hypotension: Yes Has patient had a PCN reaction causing severe rash involving mucus membranes or skin necrosis: No Has patient had a PCN reaction that required hospitalization No Has patient had a PCN reaction occurring within the last 10 years: No If all of the above answers are "NO", then may proceed with Cephalosporin use.   Sulfasalazine    Sulfonamide Derivatives     Current Meds  Medication Sig   acetaminophen (ARTHRITIS PAIN RELIEF) 650 MG CR tablet Take 1 tablet (650 mg total) by mouth 2 (two) times daily.   aspirin 81 MG EC tablet Take 81 mg by mouth daily.   Cholecalciferol (VITAMIN D3) 25 MCG (1000 UT) CAPS Take 1 capsule by mouth daily in the afternoon.   famotidine (PEPCID) 40 MG tablet TAKE (1) TABLET BY MOUTH ONCE DAILY AS NEEDED FOR INDIGESTION.   hydrochlorothiazide (HYDRODIURIL) 25 MG tablet Take 25 mg by mouth daily.   losartan (COZAAR) 25 MG tablet Take 1 tablet (25 mg total) by mouth daily.   meloxicam (MOBIC) 7.5 MG tablet Take 1 tablet (7.5 mg total) by mouth daily.   Omega-3 Fatty Acids (FISH OIL) 1000 MG CAPS Take by mouth.   simvastatin (ZOCOR) 10 MG tablet TAKE 1 TABLET BY MOUTH AT BEDTIME FOR CHOLESTEROL     MEDICAL DECISION MAKING   Encounter Diagnoses  Name Primary?   Bilateral chronic knee pain Yes   History of total knee arthroplasty, left    Primary osteoarthritis of left  knee    Primary osteoarthritis of right knee     Meds ordered this encounter  Medications   methylPREDNISolone acetate (DEPO-MEDROL) injection 40 mg   meloxicam (MOBIC) 7.5 MG tablet    Sig: Take 1 tablet (7.5 mg total) by mouth daily.    Dispense:  30 tablet    Refill:  5     Fuller Canada, MD  08/04/2022 8:19 AM

## 2022-08-01 ENCOUNTER — Telehealth: Payer: Self-pay

## 2022-08-01 NOTE — Telephone Encounter (Signed)
Patient called saying the pharmacy did not received any medicine script from Dr. Romeo Apple. Patient stated that she was told he was sending it in.   PATIENT USES [facility]

## 2022-08-04 MED ORDER — MELOXICAM 7.5 MG PO TABS
7.5000 mg | ORAL_TABLET | Freq: Every day | ORAL | 5 refills | Status: DC
Start: 2022-08-04 — End: 2023-06-02

## 2022-08-04 NOTE — Addendum Note (Signed)
Addended by: Vickki Hearing on: 08/04/2022 08:19 AM   Modules accepted: Orders

## 2022-08-05 ENCOUNTER — Telehealth: Payer: Self-pay | Admitting: Family Medicine

## 2022-08-05 NOTE — Telephone Encounter (Signed)
Patient called in regard to  acetaminophen (ARTHRITIS PAIN RELIEF) 650 MG CR tablet  and Asprin    Wants to know if she can still take while taking new med prescribed from [facility]  the meloxicam (MOBIC) 7.5 MG tablet   Patient wants a call back in regard.

## 2022-08-05 NOTE — Telephone Encounter (Signed)
Patient aware.

## 2022-09-16 ENCOUNTER — Telehealth: Payer: Self-pay | Admitting: Family Medicine

## 2022-09-16 NOTE — Telephone Encounter (Signed)
Handicap placard   Noted  Copied Sleeved  Original in PCP box Copy front desk folder  

## 2022-09-24 NOTE — Telephone Encounter (Signed)
Placard signed, patient would like it mail to her.  Mailed out on 07.10.2024.

## 2022-12-03 ENCOUNTER — Encounter: Payer: Self-pay | Admitting: Family Medicine

## 2022-12-03 ENCOUNTER — Ambulatory Visit (INDEPENDENT_AMBULATORY_CARE_PROVIDER_SITE_OTHER): Payer: Medicare Other | Admitting: Family Medicine

## 2022-12-03 VITALS — BP 129/74 | HR 62 | Ht 63.0 in | Wt 161.1 lb

## 2022-12-03 DIAGNOSIS — Z23 Encounter for immunization: Secondary | ICD-10-CM

## 2022-12-03 DIAGNOSIS — G4709 Other insomnia: Secondary | ICD-10-CM

## 2022-12-03 DIAGNOSIS — I10 Essential (primary) hypertension: Secondary | ICD-10-CM | POA: Diagnosis not present

## 2022-12-03 DIAGNOSIS — N3941 Urge incontinence: Secondary | ICD-10-CM | POA: Diagnosis not present

## 2022-12-03 DIAGNOSIS — E559 Vitamin D deficiency, unspecified: Secondary | ICD-10-CM

## 2022-12-03 DIAGNOSIS — E782 Mixed hyperlipidemia: Secondary | ICD-10-CM | POA: Diagnosis not present

## 2022-12-03 DIAGNOSIS — R12 Heartburn: Secondary | ICD-10-CM

## 2022-12-03 MED ORDER — DOXEPIN HCL 3 MG PO TABS
ORAL_TABLET | ORAL | 0 refills | Status: DC
Start: 2022-12-03 — End: 2023-06-02

## 2022-12-03 MED ORDER — MYRBETRIQ 25 MG PO TB24
ORAL_TABLET | ORAL | 3 refills | Status: DC
Start: 2022-12-03 — End: 2023-06-02

## 2022-12-03 NOTE — Assessment & Plan Note (Signed)
Controlled, no change in medication  

## 2022-12-03 NOTE — Patient Instructions (Signed)
F/U in 5 months, call if you need me sooner  Flu vaccine today  CBC, lipid, cmp and EGFr, tSH and vit D today  New medication to use on the days when you are goping out, on average 4 days/ week for incontinence  Nurse pls advise she needs both TdAP and Covid vaccine at her pharmacy  Thanks for choosing [facility], we consider it a privelige to serve you.

## 2022-12-04 ENCOUNTER — Encounter: Payer: Self-pay | Admitting: Family Medicine

## 2022-12-04 DIAGNOSIS — R32 Unspecified urinary incontinence: Secondary | ICD-10-CM | POA: Insufficient documentation

## 2022-12-04 DIAGNOSIS — G4700 Insomnia, unspecified: Secondary | ICD-10-CM | POA: Insufficient documentation

## 2022-12-04 NOTE — Assessment & Plan Note (Signed)
Primarily a problem when she is out of her home and cannot readily get to  a bathroom which is about 4 days /week, myerbetriq  25 mg daily as needed, continue keigel exercises and voiding regularly

## 2022-12-04 NOTE — Assessment & Plan Note (Signed)
Reports this  is directly related to activities of neighbors, when needs uses doxepin with good effect, on avg 2 times per week

## 2022-12-04 NOTE — Progress Notes (Signed)
Lori Stein     MRN: [id number]      DOB: 04/06/1932  Chief Complaint  Patient presents with   Follow-up    Discuss medication for bladder    HPI Lori Stein is here for follow up and re-evaluation of chronic medical conditions, medication management and review of any available recent lab and radiology data.  Preventive health is updated, specifically  Cancer screening and Immunization.   Questions or concerns regarding consultations or procedures which the PT has had in the interim are  addressed. The PT denies any adverse reactions to current medications since the last visit.  There are no new concerns.  There are no specific complaints   ROS Denies recent fever or chills. Denies sinus pressure, nasal congestion, ear pain or sore throat. Denies chest congestion, productive cough or wheezing. Denies chest pains, palpitations and leg swelling Denies abdominal pain, nausea, vomiting,diarrhea or constipation.   Denies dysuria, frequency, hesitancy or incontinence. Denies  uncontrolled joint pain, swelling and limitation in mobility. Denies headaches, seizures, numbness, or tingling. Denies depression, anxiety . Denies skin break down or rash.   PE  BP 129/74 (BP Location: Right Arm, Patient Position: Sitting, Cuff Size: Large)   Pulse 62   Ht 5\' 3"  (1.6 m)   Wt 161 lb 1.3 oz (73.1 kg)   SpO2 95%   BMI 28.53 kg/m   Patient alert and oriented and in no cardiopulmonary distress.  HEENT: No facial asymmetry, EOMI,     Neck supple .  Chest: Clear to auscultation bilaterally.  CVS: S1, S2 no murmurs, no S3.Regular rate.  ABD: Soft non tender.   Ext: No edema  MS: Adequate  though reduced ROM spine, shoulders, hips and knees.  Skin: Intact, no ulcerations or rash noted.  Psych: Good eye contact, normal affect. Memory intact not anxious or depressed appearing.  CNS: CN 2-12 intact, power,  normal throughout.no focal deficits noted.   Assessment &  Plan  Essential hypertension, benign Controlled, no change in medication   Hyperlipemia Hyperlipidemia:Low fat diet discussed and encouraged.   Lipid Panel  Lab Results  Component Value Date   CHOL 149 07/03/2022   HDL 61 07/03/2022   LDLCALC 75 07/03/2022   TRIG 65 07/03/2022   CHOLHDL 2.4 07/03/2022     Updated lab needed at/ before next visit.   Urinary incontinence Primarily a problem when she is out of her home and cannot readily get to  a bathroom which is about 4 days /week, myerbetriq  25 mg daily as needed, continue keigel exercises and voiding regularly  Heartburn Pepcid as needed, which is not required daily  Insomnia Reports this  is directly related to activities of neighbors, when needs uses doxepin with good effect, on avg 2 times per week

## 2022-12-04 NOTE — Assessment & Plan Note (Signed)
Hyperlipidemia:Low fat diet discussed and encouraged.   Lipid Panel  Lab Results  Component Value Date   CHOL 149 07/03/2022   HDL 61 07/03/2022   LDLCALC 75 07/03/2022   TRIG 65 07/03/2022   CHOLHDL 2.4 07/03/2022     Updated lab needed at/ before next visit.

## 2022-12-04 NOTE — Assessment & Plan Note (Addendum)
Pepcid as needed, which is not required daily

## 2022-12-05 LAB — CMP14+EGFR
ALT: 12 IU/L (ref 0–32)
AST: 21 IU/L (ref 0–40)
Albumin: 4.3 g/dL (ref 3.7–4.7)
Alkaline Phosphatase: 60 IU/L (ref 44–121)
BUN/Creatinine Ratio: 34 — ABNORMAL HIGH (ref 12–28)
BUN: 25 mg/dL (ref 8–27)
Bilirubin Total: 0.5 mg/dL (ref 0.0–1.2)
CO2: 20 mmol/L (ref 20–29)
Calcium: 10.5 mg/dL — ABNORMAL HIGH (ref 8.7–10.3)
Chloride: 105 mmol/L (ref 96–106)
Creatinine, Ser: 0.74 mg/dL (ref 0.57–1.00)
Globulin, Total: 2.9 g/dL (ref 1.5–4.5)
Glucose: 84 mg/dL (ref 70–99)
Potassium: 4.5 mmol/L (ref 3.5–5.2)
Sodium: 145 mmol/L — ABNORMAL HIGH (ref 134–144)
Total Protein: 7.2 g/dL (ref 6.0–8.5)
eGFR: 78 mL/min/{1.73_m2} (ref >59)

## 2022-12-05 LAB — LIPID PANEL
Chol/HDL Ratio: 2.7 ratio (ref 0.0–4.4)
Cholesterol, Total: 169 mg/dL (ref 100–199)
HDL: 62 mg/dL (ref >39)
LDL Chol Calc (NIH): 93 mg/dL (ref 0–99)
Triglycerides: 72 mg/dL (ref 0–149)
VLDL Cholesterol Cal: 14 mg/dL (ref 5–40)

## 2022-12-05 LAB — CBC
Hematocrit: 42.4 % (ref 34.0–46.6)
Hemoglobin: 14 g/dL (ref 11.1–15.9)
MCH: 30.4 pg (ref 26.6–33.0)
MCHC: 33 g/dL (ref 31.5–35.7)
MCV: 92 fL (ref 79–97)
Platelets: 252 10*3/uL (ref 150–450)
RBC: 4.61 x10E6/uL (ref 3.77–5.28)
RDW: 14.6 % (ref 11.7–15.4)
WBC: 6.6 10*3/uL (ref 3.4–10.8)

## 2022-12-05 LAB — TSH: TSH: 1.95 u[IU]/mL (ref 0.450–4.500)

## 2022-12-05 LAB — VITAMIN D 25 HYDROXY (VIT D DEFICIENCY, FRACTURES): Vit D, 25-Hydroxy: 31.8 ng/mL (ref 30.0–100.0)

## 2023-05-05 ENCOUNTER — Ambulatory Visit: Payer: Medicare Other | Admitting: Family Medicine

## 2023-06-02 ENCOUNTER — Ambulatory Visit: Payer: Medicare Other | Admitting: Family Medicine

## 2023-06-02 ENCOUNTER — Encounter: Payer: Self-pay | Admitting: Family Medicine

## 2023-06-02 VITALS — BP 138/71 | HR 66 | Wt 167.0 lb

## 2023-06-02 DIAGNOSIS — G4709 Other insomnia: Secondary | ICD-10-CM | POA: Diagnosis not present

## 2023-06-02 DIAGNOSIS — E559 Vitamin D deficiency, unspecified: Secondary | ICD-10-CM

## 2023-06-02 DIAGNOSIS — I10 Essential (primary) hypertension: Secondary | ICD-10-CM

## 2023-06-02 DIAGNOSIS — M170 Bilateral primary osteoarthritis of knee: Secondary | ICD-10-CM

## 2023-06-02 DIAGNOSIS — E785 Hyperlipidemia, unspecified: Secondary | ICD-10-CM | POA: Diagnosis not present

## 2023-06-02 DIAGNOSIS — R12 Heartburn: Secondary | ICD-10-CM

## 2023-06-02 MED ORDER — DOXEPIN HCL 3 MG PO TABS: ORAL_TABLET | ORAL | 0 refills | Status: DC

## 2023-06-02 MED ORDER — FAMOTIDINE 40 MG PO TABS: ORAL_TABLET | ORAL | 2 refills | Status: DC

## 2023-06-02 MED ORDER — SIMVASTATIN 10 MG PO TABS: ORAL_TABLET | ORAL | 3 refills | Status: DC

## 2023-06-02 MED ORDER — LOSARTAN POTASSIUM 25 MG PO TABS: 25.0000 mg | ORAL_TABLET | Freq: Every day | ORAL | 3 refills | Status: DC

## 2023-06-02 MED ORDER — BUSPIRONE HCL 5 MG PO TABS: 5.0000 mg | ORAL_TABLET | Freq: Three times a day (TID) | ORAL | 1 refills | Status: DC

## 2023-06-02 MED ORDER — MELOXICAM 7.5 MG PO TABS: 7.5000 mg | ORAL_TABLET | Freq: Every day | ORAL | 5 refills | Status: DC

## 2023-06-02 MED ORDER — HYDROCHLOROTHIAZIDE 25 MG PO TABS: 25.0000 mg | ORAL_TABLET | Freq: Every day | ORAL | 3 refills | Status: DC

## 2023-06-02 MED ORDER — MELATONIN 3-10 MG PO TABS: 3.0000 mg | ORAL_TABLET | Freq: Every evening | ORAL | Status: DC | PRN

## 2023-06-02 MED ORDER — VITAMIN D3 50 MCG (2000 UT) PO CAPS: 2000.0000 [IU] | ORAL_CAPSULE | Freq: Every day | ORAL | 3 refills | Status: AC

## 2023-06-02 NOTE — Assessment & Plan Note (Signed)
 Controlled, no change in medication

## 2023-06-02 NOTE — Assessment & Plan Note (Signed)
 Sleep hygiene reviewed and written information offered also. Prescription sent for  medication needed. Uncontrolled , start melatonin daily

## 2023-06-02 NOTE — Patient Instructions (Addendum)
 F/U in 6 months, call if you need me sooner  Lab today checks kidney function  Fasting cBC, lipid, cmp and EGFR, tSH and vit D 3 to 5 days  before next appt  (Nurse to order)  New for sleep is melatonin 3 mg daily  Start once daily vitamin d 2000 units   Be careful not to fall , you are referred to Dr Romeo Apple  Thanks for choosing [facility], we consider it a privelige to serve you.

## 2023-06-02 NOTE — Assessment & Plan Note (Signed)
 Increased pain and stiffness in knees requests ortho

## 2023-06-02 NOTE — Progress Notes (Signed)
   Lori Stein     MRN: [id number]      DOB: 09-22-1932  Chief Complaint  Patient presents with   Hypertension    Having trouble sleeping. She can take a pill but then she sleeps to long the next day,.  She is having trouble w/ light depression as all siblings are gone.  Feeling fatigued every day .    HPI Lori Stein is here for follow up and re-evaluation of chronic medical conditions, medication management and review of any available recent lab and radiology data.  Preventive health is updated, specifically  Cancer screening and Immunization.   C/o increased bilateral knee pain right more than left, requests referral to Ortho for intra articular injection C/o increased grief and mild depression lost her 6th and last sister last Summer ROS Denies recent fever or chills. Denies sinus pressure, nasal congestion, ear pain or sore throat. Denies chest congestion, productive cough or wheezing. Denies chest pains, palpitations and leg swelling Denies abdominal pain, nausea, vomiting,diarrhea or constipation.   Denies dysuria, frequency, hesitancy or incontinence. Denies joint pain, swelling and limitation in mobility. Denies headaches, seizures, numbness, or tingling. Denies depression, anxiety or insomnia. Denies skin break down or rash.   PE  BP 138/71   Pulse 66   Wt 167 lb (75.8 kg)   SpO2 94%   BMI 29.58 kg/m   Patient alert and oriented and in no cardiopulmonary distress.  HEENT: No facial asymmetry, EOMI,     Neck supple .  Chest: Clear to auscultation bilaterally.  CVS: S1, S2 no murmurs, no S3.Regular rate.  ABD: Soft non tender.   Ext: No edema  MS: decreased  ROM spine, shoulders, hips and knees.  Skin: Intact, no ulcerations or rash noted.  Psych: Good eye contact, normal affect. Memory intact not anxious or depressed appearing.  CNS: CN 2-12 intact, power,  normal throughout.no focal deficits noted.   Assessment & Plan  Essential  hypertension, benign Controlled, no change in medication   Arthritis of both knees Increased pain and stiffness in knees requests ortho  Hyperlipemia Hyperlipidemia:Low fat diet discussed and encouraged.   Lipid Panel  Lab Results  Component Value Date   CHOL 169 12/03/2022   HDL 62 12/03/2022   LDLCALC 93 12/03/2022   TRIG 72 12/03/2022   CHOLHDL 2.7 12/03/2022     Controlled, no change in medication Updated lab needed at/ before next visit.   Insomnia Sleep hygiene reviewed and written information offered also. Prescription sent for  medication needed. Uncontrolled , start melatonin daily  Heartburn Continue as needed pepcid

## 2023-06-02 NOTE — Assessment & Plan Note (Signed)
 Hyperlipidemia:Low fat diet discussed and encouraged.   Lipid Panel  Lab Results  Component Value Date   CHOL 169 12/03/2022   HDL 62 12/03/2022   LDLCALC 93 12/03/2022   TRIG 72 12/03/2022   CHOLHDL 2.7 12/03/2022     Controlled, no change in medication Updated lab needed at/ before next visit.

## 2023-06-02 NOTE — Assessment & Plan Note (Signed)
Continue as needed pepcid

## 2023-06-04 ENCOUNTER — Telehealth: Payer: Self-pay

## 2023-06-25 ENCOUNTER — Ambulatory Visit: Admitting: Orthopedic Surgery

## 2023-06-25 ENCOUNTER — Encounter: Payer: Self-pay | Admitting: Orthopedic Surgery

## 2023-06-25 VITALS — BP 117/68 | HR 78 | Ht 63.0 in | Wt 163.0 lb

## 2023-06-25 DIAGNOSIS — M1711 Unilateral primary osteoarthritis, right knee: Secondary | ICD-10-CM

## 2023-06-25 MED ORDER — METHYLPREDNISOLONE ACETATE 40 MG/ML IJ SUSP
40.0000 mg | Freq: Once | INTRAMUSCULAR | Status: AC
  Administered 2023-06-25: 40 mg via INTRA_ARTICULAR

## 2023-06-25 NOTE — Progress Notes (Signed)
 89 year old female status post left total knee by Dr. Cleophas Dunker comes in with recurrent right knee pain.  She has osteoarthritis of the right knee she was last seen in 2020 for she has end-stage arthritis  She is not interested in surgery  Exam shows she is ambulatory with a cane she has a slow gait short steps she has tenderness in the medial compartment varus knee slight flexion contracture but flexes the knee up to 120 degrees there is no instability skin is intact  No new films last films 2024 that was reviewed  Recommend injection  Procedure note right knee injection   verbal consent was obtained to inject right knee joint  Timeout was completed to confirm the site of injection  The medications used were depomedrol 40 mg and 1% lidocaine 3 cc Anesthesia was provided by ethyl chloride and the skin was prepped with alcohol.  After cleaning the skin with alcohol a 20-gauge needle was used to inject the right knee joint. There were no complications. A sterile bandage was applied.

## 2023-06-25 NOTE — Addendum Note (Signed)
 Addended byCaffie Damme on: 06/25/2023 11:45 AM   Modules accepted: Orders

## 2023-06-25 NOTE — Progress Notes (Signed)
  Intake history:  BP 117/68   Pulse 78   Ht 5\' 3"  (1.6 m)   Wt 163 lb (73.9 kg)   BMI 28.87 kg/m  Body mass index is 28.87 kg/m.    WHAT ARE WE SEEING YOU FOR TODAY?   bilateral knee(s) right is worse   How long has this bothered you? (DOI?DOS?WS?)  Long duration worse recently pain comes and goes   Anticoag.  No  Diabetes No  Heart disease No  Hypertension Yes  SMOKING HX No  Kidney disease No     Latest Ref Rng & Units 12/03/2022    2:41 PM 07/03/2022    1:56 PM 12/31/2021    2:41 PM  CMP  Glucose 70 - 99 mg/dL 84  80  78   BUN 8 - 27 mg/dL 25  18  16    Creatinine 0.57 - 1.00 mg/dL 4.25  9.56  3.87   Sodium 134 - 144 mmol/L 145  143  142   Potassium 3.5 - 5.2 mmol/L 4.5  4.4  3.7   Chloride 96 - 106 mmol/L 105  105  102   CO2 20 - 29 mmol/L 20  24  25    Calcium 8.7 - 10.3 mg/dL 56.4  33.2  95.1   Total Protein 6.0 - 8.5 g/dL 7.2  6.9  7.5   Total Bilirubin 0.0 - 1.2 mg/dL 0.5  0.7  0.7   Alkaline Phos 44 - 121 IU/L 60  59  69   AST 0 - 40 IU/L 21  15  11    ALT 0 - 32 IU/L 12  12  11       Any ALLERGIES ________ Allergies  Allergen Reactions   Penicillins     Has patient had a PCN reaction causing immediate rash, facial/tongue/throat swelling, SOB or lightheadedness with hypotension: Yes Has patient had a PCN reaction causing severe rash involving mucus membranes or skin necrosis: No Has patient had a PCN reaction that required hospitalization No Has patient had a PCN reaction occurring within the last 10 years: No If all of the above answers are "NO", then may proceed with Cephalosporin use.   Sulfasalazine    Sulfonamide Derivatives    ______________________________________   Treatment:  Have you taken:  Tylenol Yes  Advil No  Had PT Yes years ago   Had injection Yes years ago   Other  _________________________

## 2023-06-29 NOTE — Telephone Encounter (Signed)
 Encounter entered in error.

## 2023-07-15 ENCOUNTER — Telehealth: Payer: Self-pay | Admitting: Family Medicine

## 2023-07-15 DIAGNOSIS — Z880 Allergy status to penicillin: Secondary | ICD-10-CM | POA: Diagnosis not present

## 2023-07-15 DIAGNOSIS — S0083XA Contusion of other part of head, initial encounter: Secondary | ICD-10-CM | POA: Diagnosis not present

## 2023-07-15 DIAGNOSIS — Z882 Allergy status to sulfonamides status: Secondary | ICD-10-CM | POA: Diagnosis not present

## 2023-07-15 DIAGNOSIS — Z7982 Long term (current) use of aspirin: Secondary | ICD-10-CM | POA: Diagnosis not present

## 2023-07-15 DIAGNOSIS — S0511XA Contusion of eyeball and orbital tissues, right eye, initial encounter: Secondary | ICD-10-CM | POA: Diagnosis not present

## 2023-07-15 DIAGNOSIS — R0789 Other chest pain: Secondary | ICD-10-CM | POA: Diagnosis not present

## 2023-07-15 DIAGNOSIS — S0990XA Unspecified injury of head, initial encounter: Secondary | ICD-10-CM | POA: Diagnosis not present

## 2023-07-15 DIAGNOSIS — R220 Localized swelling, mass and lump, head: Secondary | ICD-10-CM | POA: Diagnosis not present

## 2023-07-15 DIAGNOSIS — R079 Chest pain, unspecified: Secondary | ICD-10-CM | POA: Diagnosis not present

## 2023-07-15 NOTE — Telephone Encounter (Signed)
 Patient came by office to see a provider today had an opening at 2:20 with Alison Irvine, NP, Dr Rodolph Clap out of the office. The patient family did not want to wait 1 hour and 20 minutes to be seen, they went to [facility] ER. Patient fell yesterday upper left eye bruised, some pain in chest from the fall.

## 2023-07-17 ENCOUNTER — Encounter: Payer: Self-pay | Admitting: Family Medicine

## 2023-07-17 ENCOUNTER — Ambulatory Visit (INDEPENDENT_AMBULATORY_CARE_PROVIDER_SITE_OTHER): Payer: Self-pay | Admitting: Family Medicine

## 2023-07-17 VITALS — BP 134/86 | HR 75 | Temp 98.1°F | Ht 63.0 in | Wt 158.1 lb

## 2023-07-17 DIAGNOSIS — R7989 Other specified abnormal findings of blood chemistry: Secondary | ICD-10-CM | POA: Diagnosis not present

## 2023-07-17 DIAGNOSIS — Z09 Encounter for follow-up examination after completed treatment for conditions other than malignant neoplasm: Secondary | ICD-10-CM | POA: Insufficient documentation

## 2023-07-17 DIAGNOSIS — E878 Other disorders of electrolyte and fluid balance, not elsewhere classified: Secondary | ICD-10-CM | POA: Diagnosis not present

## 2023-07-17 NOTE — Patient Instructions (Addendum)
        Great to see you today.  I have refilled the medication(s) we provide.    Results:   Head and Facial CT (No Contrast):  No acute intracranial abnormalities (no bleeding, fluid collections, shift, or mass effect).  No depressed skull or facial fractures.  Mild soft tissue swelling over the right cheek and right outer eye area (periorbital region).  Chest CT (No Contrast):  Lungs: Clear; no consolidation, effusion, or pneumothorax.  Heart and Vessels: Normal size and course; mild atherosclerosis in the aorta and coronary arteries.  Lymph Nodes: No abnormal enlargement.  Bones/Soft Tissues: No acute fractures; degenerative spine changes and mild soft tissue/muscle edema in the right lower chest.      If labs were collected, we will inform you of lab results once received either by echart message or telephone call.   - echart message- for normal results that have been seen by the patient already.   - telephone call: abnormal results or if patient has not viewed results in their echart.   - Please take medications as prescribed. - Follow up with your primary health provider if any health concerns arises. - If symptoms worsen please contact your primary care provider and/or visit the emergency department.

## 2023-07-17 NOTE — Assessment & Plan Note (Signed)
 Basic Metabolic Panel (BMP) and Complete Blood Count (CBC) labs were ordered. The hospital chart, including the discharge summary, was thoroughly reviewed. The patient's medications were carefully reviewed and reconciled. The patient reports mild pain, which she manages effectively with Tylenol .

## 2023-07-17 NOTE — Progress Notes (Signed)
 Established Patient Office Visit   Subjective  Patient ID: Lori Stein, female    DOB: 01/12/1933  Age: 89 y.o. MRN: [id number]  Chief Complaint  Patient presents with   Post Fall Follow up    Patient fell on Wednesday and went to the emergency room. She was not using her cane, she went to make a turn then she fell down. Fell on her right side, has bruising on her face around right eye and some on her right arm.     She  has a past medical history of Back pain, Hyperlipidemia, Hypertension, OA (osteoarthritis) of knee, and Osteophyte, right knee (05/21/2017).  Hospital Follow-Up Summary:  This is an 89 year old female who presented to the emergency department following a mechanical fall on 07/15/2023, resulting in facial trauma and right-sided chest pain. Imaging included CT of the head, maxillofacial region, and chest without contrast. CT of the head showed no acute intracranial abnormality, hemorrhage, or skull fracture. Maxillofacial CT revealed no depressed fractures but did show mild soft tissue swelling over the right cheek and right periorbital area. Chest CT demonstrated no evidence of pneumothorax, pleural effusion, or acute lung injury. There were no acute fractures, though mild soft tissue and intramuscular edema was noted in the right lower chest wall. Additional findings included multilevel degenerative changes in the spine, benign-appearing bilateral renal parapelvic cysts, atherosclerotic calcifications in the aorta and coronary arteries, and nodular thickening of the adrenal glands, likely related to chronic hypertension. Cholecystectomy clips were also noted. The patient remains stable with no acute surgical issues identified.      Review of Systems  Constitutional:  Negative for chills and fever.  Eyes:  Negative for blurred vision.  Respiratory:  Negative for shortness of breath.   Cardiovascular:  Negative for chest pain.  Neurological:  Negative for dizziness and  headaches.      Objective:     BP 134/86   Pulse 75   Temp 98.1 F (36.7 C) (Oral)   Ht 5\' 3"  (1.6 m)   Wt 158 lb 1.3 oz (71.7 kg)   SpO2 95%   BMI 28.00 kg/m  BP Readings from Last 3 Encounters:  07/17/23 134/86  06/25/23 117/68  06/02/23 138/71      Physical Exam Vitals reviewed.  Constitutional:      General: She is not in acute distress.    Appearance: Normal appearance. She is not ill-appearing, toxic-appearing or diaphoretic.  HENT:     Head: Normocephalic.  Eyes:     General:        Right eye: No discharge.        Left eye: No discharge.     Conjunctiva/sclera: Conjunctivae normal.  Cardiovascular:     Rate and Rhythm: Normal rate.     Pulses: Normal pulses.     Heart sounds: Normal heart sounds.  Pulmonary:     Effort: Pulmonary effort is normal. No respiratory distress.     Breath sounds: Normal breath sounds.  Abdominal:     General: Bowel sounds are normal.     Palpations: Abdomen is soft.     Tenderness: There is no abdominal tenderness. There is no guarding.  Skin:    General: Skin is warm and dry.     Capillary Refill: Capillary refill takes less than 2 seconds.  Neurological:     Mental Status: She is alert.     Coordination: Coordination abnormal.     Gait: Gait abnormal.  Psychiatric:  Mood and Affect: Mood normal.        Behavior: Behavior normal.      No results found for any visits on 07/17/23.  The ASCVD Risk score (Arnett DK, et al., 2019) failed to calculate for the following reasons:   The 2019 ASCVD risk score is only valid for ages 38 to 85    Assessment & Plan:  Hospital discharge follow-up Assessment & Plan: Basic Metabolic Panel (BMP) and Complete Blood Count (CBC) labs were ordered. The hospital chart, including the discharge summary, was thoroughly reviewed. The patient's medications were carefully reviewed and reconciled. The patient reports mild pain, which she manages effectively with  Tylenol .    Electrolyte imbalance  Abnormal CBC -     CBC with Differential/Platelet    Return if symptoms worsen or fail to improve.   Avelino Lek Amber Bail, FNP

## 2023-07-18 LAB — CBC WITH DIFFERENTIAL/PLATELET
Basophils Absolute: 0 10*3/uL (ref 0.0–0.2)
Basos: 0 %
EOS (ABSOLUTE): 0.1 10*3/uL (ref 0.0–0.4)
Eos: 1 %
Hematocrit: 43.1 % (ref 34.0–46.6)
Hemoglobin: 13.9 g/dL (ref 11.1–15.9)
Immature Grans (Abs): 0 10*3/uL (ref 0.0–0.1)
Immature Granulocytes: 0 %
Lymphocytes Absolute: 1.6 10*3/uL (ref 0.7–3.1)
Lymphs: 21 %
MCH: 29.5 pg (ref 26.6–33.0)
MCHC: 32.3 g/dL (ref 31.5–35.7)
MCV: 92 fL (ref 79–97)
Monocytes Absolute: 0.7 10*3/uL (ref 0.1–0.9)
Monocytes: 9 %
Neutrophils Absolute: 5.3 10*3/uL (ref 1.4–7.0)
Neutrophils: 69 %
Platelets: 234 10*3/uL (ref 150–450)
RBC: 4.71 x10E6/uL (ref 3.77–5.28)
RDW: 15.6 % — ABNORMAL HIGH (ref 11.7–15.4)
WBC: 7.6 10*3/uL (ref 3.4–10.8)

## 2023-08-05 ENCOUNTER — Telehealth: Payer: Self-pay

## 2023-08-05 NOTE — Telephone Encounter (Signed)
 See other note

## 2023-08-05 NOTE — Telephone Encounter (Signed)
 Copied from CRM [id number]. Topic: Clinical - Medication Question >> Aug 05, 2023  2:35 PM Sasha H wrote: Reason for CRM: Pt is wanting to speak with nurse about a medication that was prescribed to her.

## 2023-08-05 NOTE — Telephone Encounter (Signed)
 Copied from CRM [id number]. Topic: Clinical - Prescription Issue >> Aug 05, 2023  2:39 PM Felizardo Hotter wrote: Reason for CRM: Pt called regarding Melatonin 3-10 MG TABS. Pt stated would like to change medication due to depressing dreams the medication is causing. When she does not take medication depressing dreams cease. Please call pt at [phone].

## 2023-08-06 ENCOUNTER — Other Ambulatory Visit: Payer: Self-pay | Admitting: Family Medicine

## 2023-08-06 ENCOUNTER — Encounter: Payer: Self-pay | Admitting: Family Medicine

## 2023-08-06 NOTE — Telephone Encounter (Signed)
 I spoke with pt , she is to takwe ONE tylenol  arthritis tab in the morning and new is one tylenol  PM at bedtime ansd stop melatonon\ She understands and agrees

## 2023-08-06 NOTE — Progress Notes (Signed)
 Will recommend tylenp; pm at night instead of arthritis tylenol 

## 2023-12-03 ENCOUNTER — Ambulatory Visit: Admitting: Family Medicine

## 2023-12-03 ENCOUNTER — Telehealth: Payer: Self-pay

## 2023-12-03 NOTE — Telephone Encounter (Signed)
 Copied from CRM [id number]. Topic: Clinical - Medication Question >> Dec 03, 2023  9:11 AM Gustabo D wrote: Pt wants to know what medications she should be taking she says the pharmacy gave her 5 of them and she says she don't think she needs that many.

## 2023-12-04 NOTE — Telephone Encounter (Signed)
 Have tried calling the number in chart multiple times but call will not go through. Mailing a copy of her med list

## 2023-12-08 DIAGNOSIS — Z23 Encounter for immunization: Secondary | ICD-10-CM | POA: Diagnosis not present

## 2023-12-16 ENCOUNTER — Other Ambulatory Visit: Payer: Self-pay | Admitting: Family Medicine

## 2023-12-16 DIAGNOSIS — N3941 Urge incontinence: Secondary | ICD-10-CM

## 2024-01-01 ENCOUNTER — Other Ambulatory Visit: Payer: Self-pay | Admitting: Family Medicine

## 2024-01-01 DIAGNOSIS — N3941 Urge incontinence: Secondary | ICD-10-CM

## 2024-01-02 ENCOUNTER — Other Ambulatory Visit: Payer: Self-pay | Admitting: Family Medicine

## 2024-01-02 DIAGNOSIS — N3941 Urge incontinence: Secondary | ICD-10-CM

## 2024-01-06 ENCOUNTER — Other Ambulatory Visit: Payer: Self-pay | Admitting: Family Medicine

## 2024-01-06 ENCOUNTER — Telehealth: Payer: Self-pay | Admitting: Family Medicine

## 2024-01-06 DIAGNOSIS — N3941 Urge incontinence: Secondary | ICD-10-CM

## 2024-01-06 NOTE — Telephone Encounter (Signed)
 Dr Antonetta discontinued medication in march of this year

## 2024-01-06 NOTE — Telephone Encounter (Signed)
 Patient aware

## 2024-01-06 NOTE — Telephone Encounter (Signed)
 Prescription Request  01/06/2024  LOV: 06/02/2023  What is the name of the medication or equipment? Nyrbetriq 25 mg  Have you contacted your pharmacy to request a refill? Yes   Which pharmacy would you like this sent to?  [facility] - [place], [place] - [address] [place] [address] Phone: [phone] Fax: [phone]    Patient notified that their request is being sent to the clinical staff for review and that they should receive a response within 2 business days.   Please advise at Mobile There is no such number on file (mobile).

## 2024-01-12 ENCOUNTER — Telehealth: Payer: Self-pay | Admitting: Family Medicine

## 2024-01-12 NOTE — Telephone Encounter (Signed)
 Copied from CRM [id number]. Topic: General - Other >> Jan 12, 2024 12:03 PM Victoria B wrote: Reason for CRM: patient called in, wants a list sent to her of meds she shouldn't be taking

## 2024-01-13 NOTE — Telephone Encounter (Signed)
 We can provide her a list of the meds she is SUPPOSED to be taking. I will mail it to her

## 2024-03-01 ENCOUNTER — Ambulatory Visit

## 2024-03-01 VITALS — Ht 64.0 in | Wt 170.0 lb

## 2024-03-01 DIAGNOSIS — Z0000 Encounter for general adult medical examination without abnormal findings: Secondary | ICD-10-CM | POA: Diagnosis not present

## 2024-03-01 NOTE — Progress Notes (Signed)
 No voiced or noted concerns at this time  Chief Complaint  Patient presents with   Medicare Wellness     Subjective:   Lori Stein is a 89 y.o. female who presents for a Medicare Annual Wellness Visit.  Visit info / Clinical Intake: Medicare Wellness Visit Type:: Subsequent Annual Wellness Visit Persons participating in visit and providing information:: patient Medicare Wellness Visit Mode:: Telephone If telephone:: video declined Since this visit was completed virtually, some vitals may be partially provided or unavailable. Missing vitals are due to the limitations of the virtual format.: Documented vitals are patient reported If Telephone or Video please confirm:: I connected with patient using audio/video enable telemedicine. I verified patient identity with two identifiers, discussed telehealth limitations, and patient agreed to proceed. Patient Location:: home Provider Location:: home office Interpreter Needed?: No Pre-visit prep was completed: yes AWV questionnaire completed by patient prior to visit?: no Living arrangements:: (!) lives alone Patient's Overall Health Status Rating: good Typical amount of pain: some Does pain affect daily life?: no Are you currently prescribed opioids?: no  Dietary Habits and Nutritional Risks How many meals a day?: 2 Eats fruit and vegetables daily?: yes Most meals are obtained by: preparing own meals In the last 2 weeks, have you had any of the following?: none Diabetic:: no  Functional Status Activities of Daily Living (to include ambulation/medication): Independent Ambulation: Independent with device- listed below Home Assistive Devices/Equipment: Cane Medication Administration: Independent Home Management (perform basic housework or laundry): Independent Manage your own finances?: yes Primary transportation is: family / friends Concerns about vision?: no *vision screening is required for WTM* Concerns about hearing?:  no  Fall Screening Falls in the past year?: 1 Number of falls in past year: 0 Was there an injury with Fall?: 1 Fall Risk Category Calculator: 2 Patient Fall Risk Level: Moderate Fall Risk  Fall Risk Patient at Risk for Falls Due to: History of fall(s); Impaired balance/gait; Impaired mobility Fall risk Follow up: Falls evaluation completed; Education provided; Falls prevention discussed  Home and Transportation Safety: All rugs have non-skid backing?: N/A, no rugs All stairs or steps have railings?: yes Grab bars in the bathtub or shower?: yes Have non-skid surface in bathtub or shower?: yes Good home lighting?: yes Regular seat belt use?: yes Hospital stays in the last year:: no  Cognitive Assessment Difficulty concentrating, remembering, or making decisions? : no Will 6CIT or Mini Cog be Completed: yes What year is it?: 0 points What month is it?: 0 points Give patient an address phrase to remember (5 components): [address] [place] About what time is it?: 0 points Count backwards from 20 to 1: 0 points Say the months of the year in reverse: 0 points Repeat the address phrase from earlier: 0 points 6 CIT Score: 0 points  Advance Directives (For Healthcare) Does Patient Have a Medical Advance Directive?: No Would patient like information on creating a medical advance directive?: Yes (MAU/Ambulatory/Procedural Areas - Information given)  Reviewed/Updated  Reviewed/Updated: Reviewed All (Medical, Surgical, Family, Medications, Allergies, Care Teams, Patient Goals)    Allergies (verified) Melatonin, Penicillins, Sulfasalazine, and Sulfonamide derivatives   Current Medications (verified) Outpatient Encounter Medications as of 03/01/2024  Medication Sig   acetaminophen  (ARTHRITIS PAIN RELIEF) 650 MG CR tablet Take 1 tablet (650 mg total) by mouth 2 (two) times daily.   aspirin 81 MG EC tablet Take 81 mg by mouth daily.   busPIRone  (BUSPAR ) 5 MG tablet Take 5 mg  by mouth  3 (three) times daily.   Cholecalciferol  (VITAMIN D3) 50 MCG (2000 UT) capsule Take 1 capsule (2,000 Units total) by mouth daily.   Doxepin  HCl 3 MG TABS Take 1 tablet by mouth at bedtime as needed.   famotidine  (PEPCID ) 40 MG tablet TAKE (1) TABLET BY MOUTH ONCE DAILY AS NEEDED FOR INDIGESTION.   hydrochlorothiazide  (HYDRODIURIL ) 25 MG tablet Take 1 tablet (25 mg total) by mouth daily.   losartan  (COZAAR ) 25 MG tablet Take 1 tablet (25 mg total) by mouth daily.   Omega-3 Fatty Acids (FISH OIL) 1000 MG CAPS Take by mouth.   simvastatin  (ZOCOR ) 10 MG tablet TAKE 1 TABLET BY MOUTH AT BEDTIME FOR CHOLESTEROL   No facility-administered encounter medications on file as of 03/01/2024.    History: Past Medical History:  Diagnosis Date   Back pain    Hyperlipidemia    Hypertension    OA (osteoarthritis) of knee    Osteophyte, right knee 05/21/2017   Past Surgical History:  Procedure Laterality Date   Bilateral tubal ligation     CHOLECYSTECTOMY  2011   JOINT REPLACEMENT  09/2010   left knee   removal of mole from left neck  2004   Family History  Problem Relation Age of Onset   Aneurysm Mother        AAA   Heart attack Father    Hypertension Father    Diabetes Sister    Diabetes Brother    Hypertension Sister    Social History   Occupational History   Occupation: Retired  Tobacco Use   Smoking status: Never   Smokeless tobacco: Never  Vaping Use   Vaping status: Never Used  Substance and Sexual Activity   Alcohol use: No   Drug use: No   Sexual activity: Not Currently   Tobacco Counseling Counseling given: Yes  SDOH Screenings   Food Insecurity: No Food Insecurity (03/01/2024)  Housing: Low Risk (03/01/2024)  Transportation Needs: No Transportation Needs (03/01/2024)  Utilities: Not At Risk (03/01/2024)  Depression (PHQ2-9): Low Risk (03/01/2024)  Physical Activity: Inactive (03/01/2024)  Social Connections: Moderately Integrated (03/01/2024)  Stress:  No Stress Concern Present (03/01/2024)  Tobacco Use: Low Risk (03/01/2024)  Health Literacy: Adequate Health Literacy (03/01/2024)   See flowsheets for full screening details  Depression Screen PHQ 2 & 9 Depression Scale- Over the past 2 weeks, how often have you been bothered by any of the following problems? Little interest or pleasure in doing things: 0 Feeling down, depressed, or hopeless (PHQ Adolescent also includes...irritable): 1 PHQ-2 Total Score: 1 Trouble falling or staying asleep, or sleeping too much: 0 Feeling tired or having little energy: 0 Poor appetite or overeating (PHQ Adolescent also includes...weight loss): 0 Feeling bad about yourself - or that you are a failure or have let yourself or your family down: 0 Trouble concentrating on things, such as reading the newspaper or watching television (PHQ Adolescent also includes...like school work): 0 Moving or speaking so slowly that other people could have noticed. Or the opposite - being so fidgety or restless that you have been moving around a lot more than usual: 0 Thoughts that you would be better off dead, or of hurting yourself in some way: 0 PHQ-9 Total Score: 1 If you checked off any problems, how difficult have these problems made it for you to do your work, take care of things at home, or get along with other people?: Not difficult at all  Depression Treatment Depression Interventions/Treatment : EYV7-0 Score <  4 Follow-up Not Indicated     Goals Addressed               This Visit's Progress     Exercise 150 minutes per week (moderate activity)   On track     When weather is nice- continue to get outside and work in yard and go for walks as tolerated      Stay active and healthy (pt-stated)               Objective:    Today's Vitals   03/01/24 1530  Weight: 170 lb (77.1 kg)  Height: 5' 4 (1.626 m)   Body mass index is 29.18 kg/m.  Hearing/Vision screen Hearing Screening - Comments::  Patient denies any hearing difficulties.   Vision Screening - Comments:: Patient has not had an eye screening in the last couple of years. She states she does not have cataracts, denies any vision difficulties, and states that Dr. Odetta didn't have any concerns about her vision at her last visit with him.   Immunizations and Health Maintenance Health Maintenance  Topic Date Due   Medicare Annual Wellness (AWV)  02/12/2023   COVID-19 Vaccine (6 - 2025-26 season) 11/16/2023   DTaP/Tdap/Td (2 - Tdap) 06/01/2024 (Originally 11/22/2013)   Pneumococcal Vaccine: 50+ Years  Completed   Influenza Vaccine  Completed   Bone Density Scan  Completed   Zoster Vaccines- Shingrix  Completed   Meningococcal B Vaccine  Aged Out        Assessment/Plan:  This is a routine wellness examination for [facility].  Patient Care Team: Antonetta Rollene BRAVO, MD as PCP - General Margrette Taft BRAVO, MD as Consulting Physician (Orthopedic Surgery) Patty, A. Robynn, MD (Ophthalmology)  I have personally reviewed and noted the following in the patients chart:   Medical and social history Use of alcohol, tobacco or illicit drugs  Current medications and supplements including opioid prescriptions. Functional ability and status Nutritional status Physical activity Advanced directives List of other physicians Hospitalizations, surgeries, and ER visits in previous 12 months Vitals Screenings to include cognitive, depression, and falls Referrals and appointments  No orders of the defined types were placed in this encounter.  In addition, I have reviewed and discussed with patient certain preventive protocols, quality metrics, and best practice recommendations. A written personalized care plan for preventive services as well as general preventive health recommendations were provided to patient.   Arlind Klingerman, CMA   03/01/2024   Return on Friday March 03, 2025 at 1:50 pm, for your yearly Medicare Wellness Visit  in person.  After Visit Summary: (Mail) Due to this being a telephonic visit, the after visit summary with patients personalized plan was offered to patient via mail

## 2024-03-01 NOTE — Patient Instructions (Signed)
 [name],  Thank you for taking the time for your Medicare Wellness Visit. I appreciate your continued commitment to your health goals. Please review the care plan we discussed, and feel free to reach out if I can assist you further.  Please note that Annual Wellness Visits do not include a physical exam. Some assessments may be limited, especially if the visit was conducted virtually. If needed, we may recommend an in-person follow-up with your provider.  Ongoing Care Seeing your primary care provider every 3 to 6 months helps us  monitor your health and provide consistent, personalized care.   1 year follow up for Medicare well visit: Friday March 03, 2025 at 1:50 pm with medicare wellness nurse in office  Referrals If a referral was made during today's visit and you haven't received any updates within two weeks, please contact the referred provider directly to check on the status. No referrals placed today     Recommended Screenings:  Health Maintenance  Topic Date Due   Medicare Annual Wellness Visit  02/12/2023   COVID-19 Vaccine (6 - 2025-26 season) 11/16/2023   DTaP/Tdap/Td vaccine (2 - Tdap) 06/01/2024*   Pneumococcal Vaccine for age over 60  Completed   Flu Shot  Completed   Osteoporosis screening with Bone Density Scan  Completed   Zoster (Shingles) Vaccine  Completed   Meningitis B Vaccine  Aged Out  *Topic was postponed. The date shown is not the original due date.       03/01/2024    3:36 PM  Advanced Directives  Does Patient Have a Medical Advance Directive? No  Would patient like information on creating a medical advance directive? Yes (MAU/Ambulatory/Procedural Areas - Information given)    Vision: Annual vision screenings are recommended for early detection of glaucoma, cataracts, and diabetic retinopathy. These exams can also reveal signs of chronic conditions such as diabetes and high blood pressure.  Dental: Annual dental screenings help detect  early signs of oral cancer, gum disease, and other conditions linked to overall health, including heart disease and diabetes.  Please see the attached documents for additional preventive care recommendations.

## 2024-03-07 ENCOUNTER — Other Ambulatory Visit: Payer: Self-pay | Admitting: Family Medicine

## 2024-03-07 DIAGNOSIS — N3941 Urge incontinence: Secondary | ICD-10-CM

## 2024-03-25 ENCOUNTER — Encounter: Payer: Self-pay | Admitting: Family Medicine

## 2024-03-25 ENCOUNTER — Ambulatory Visit: Admitting: Family Medicine

## 2024-03-25 VITALS — BP 120/70 | HR 77 | Resp 16 | Ht 64.0 in | Wt 153.0 lb

## 2024-03-25 DIAGNOSIS — E559 Vitamin D deficiency, unspecified: Secondary | ICD-10-CM | POA: Diagnosis not present

## 2024-03-25 DIAGNOSIS — R12 Heartburn: Secondary | ICD-10-CM

## 2024-03-25 DIAGNOSIS — E782 Mixed hyperlipidemia: Secondary | ICD-10-CM

## 2024-03-25 DIAGNOSIS — G4709 Other insomnia: Secondary | ICD-10-CM | POA: Diagnosis not present

## 2024-03-25 DIAGNOSIS — I10 Essential (primary) hypertension: Secondary | ICD-10-CM | POA: Diagnosis not present

## 2024-03-25 DIAGNOSIS — M170 Bilateral primary osteoarthritis of knee: Secondary | ICD-10-CM | POA: Diagnosis not present

## 2024-03-25 MED ORDER — DOXEPIN HCL 3 MG PO TABS: ORAL_TABLET | ORAL | 0 refills | Status: AC

## 2024-03-25 MED ORDER — MELOXICAM 7.5 MG PO TABS: ORAL_TABLET | ORAL | 1 refills | Status: AC

## 2024-03-25 MED ORDER — LOSARTAN POTASSIUM 25 MG PO TABS: 25.0000 mg | ORAL_TABLET | Freq: Every day | ORAL | 2 refills | Status: AC

## 2024-03-25 MED ORDER — HYDROCHLOROTHIAZIDE 25 MG PO TABS: 25.0000 mg | ORAL_TABLET | Freq: Every day | ORAL | 2 refills | Status: AC

## 2024-03-25 MED ORDER — FAMOTIDINE 40 MG PO TABS: ORAL_TABLET | ORAL | 1 refills | Status: AC

## 2024-03-25 MED ORDER — SIMVASTATIN 10 MG PO TABS: ORAL_TABLET | ORAL | 2 refills | Status: AC

## 2024-03-25 NOTE — Patient Instructions (Signed)
 F/U end August  Labs today cBc, lipid, cmp and EGFr, TSH and vit D  Medications as discussed and on your summary  Please get covid vaccines at your pharmacy ( Nurse pls verify that none in past year  Be careful not to fall  Thanks for choosing [facility], we consider it a privelige to serve you.

## 2024-03-25 NOTE — Progress Notes (Unsigned)
" ° °  Lori Stein     MRN: [id number]      DOB: [birth year]  Chief Complaint  Patient presents with   Hypertension    6 month follow up     HPI Lori Stein is here for follow up and re-evaluation of chronic medical conditions, medication management and review of any available recent lab and radiology data.  Preventive health is updated, specifically  Cancer screening and Immunization.   Questions or concerns regarding consultations or procedures which the PT has had in the interim are  addressed. The PT denies any adverse reactions to current medications since the last visit.  There are no new concerns.  There are no specific complaints   ROS Denies recent fever or chills. Denies sinus pressure, nasal congestion, ear pain or sore throat. Denies chest congestion, productive cough or wheezing. Denies chest pains, palpitations and leg swelling Denies abdominal pain, nausea, vomiting,diarrhea or constipation.   Denies dysuria, frequency, hesitancy or incontinence. Denies joint pain, swelling and limitation in mobility. Denies headaches, seizures, numbness, or tingling. Denies depression, anxiety or insomnia. Denies skin break down or rash.   PE  BP (!) 157/80   Pulse 77   Resp 16   Ht 5' 4 (1.626 m)   Wt 153 lb 0.6 oz (69.4 kg)   SpO2 95%   BMI 26.27 kg/m   Patient alert and oriented and in no cardiopulmonary distress.  HEENT: No facial asymmetry, EOMI,     Neck supple .  Chest: Clear to auscultation bilaterally.  CVS: S1, S2 no murmurs, no S3.Regular rate.  ABD: Soft non tender.   Ext: No edema  MS: Adequate ROM spine, shoulders, hips and knees.  Skin: Intact, no ulcerations or rash noted.  Psych: Good eye contact, normal affect. Memory intact not anxious or depressed appearing.  CNS: CN 2-12 intact, power,  normal throughout.no focal deficits noted.   Assessment & Plan  No problem-specific Assessment & Plan notes found for this encounter.  "

## 2024-03-26 ENCOUNTER — Encounter: Payer: Self-pay | Admitting: Family Medicine

## 2024-03-26 ENCOUNTER — Ambulatory Visit: Payer: Self-pay | Admitting: Family Medicine

## 2024-03-26 LAB — CBC WITH DIFFERENTIAL/PLATELET
Basophils Absolute: 0 x10E3/uL (ref 0.0–0.2)
Basos: 0 %
EOS (ABSOLUTE): 0.1 x10E3/uL (ref 0.0–0.4)
Eos: 1 %
Hematocrit: 41.3 % (ref 34.0–46.6)
Hemoglobin: 13.3 g/dL (ref 11.1–15.9)
Immature Grans (Abs): 0 x10E3/uL (ref 0.0–0.1)
Immature Granulocytes: 0 %
Lymphocytes Absolute: 1.8 x10E3/uL (ref 0.7–3.1)
Lymphs: 27 %
MCH: 30 pg (ref 26.6–33.0)
MCHC: 32.2 g/dL (ref 31.5–35.7)
MCV: 93 fL (ref 79–97)
Monocytes Absolute: 0.5 x10E3/uL (ref 0.1–0.9)
Monocytes: 7 %
Neutrophils Absolute: 4.4 x10E3/uL (ref 1.4–7.0)
Neutrophils: 65 %
Platelets: 240 x10E3/uL (ref 150–450)
RBC: 4.44 x10E6/uL (ref 3.77–5.28)
RDW: 14.7 % (ref 11.7–15.4)
WBC: 6.8 x10E3/uL (ref 3.4–10.8)

## 2024-03-26 LAB — LIPID PANEL
Chol/HDL Ratio: 2.5 ratio (ref 0.0–4.4)
Cholesterol, Total: 164 mg/dL (ref 100–199)
HDL: 65 mg/dL
LDL Chol Calc (NIH): 86 mg/dL (ref 0–99)
Triglycerides: 64 mg/dL (ref 0–149)
VLDL Cholesterol Cal: 13 mg/dL (ref 5–40)

## 2024-03-26 LAB — CMP14+EGFR
ALT: 12 IU/L (ref 0–32)
AST: 15 IU/L (ref 0–40)
Albumin: 4.2 g/dL (ref 3.7–4.7)
Alkaline Phosphatase: 50 IU/L (ref 48–129)
BUN/Creatinine Ratio: 36 — ABNORMAL HIGH (ref 12–28)
BUN: 22 mg/dL (ref 8–27)
Bilirubin Total: 0.6 mg/dL (ref 0.0–1.2)
CO2: 24 mmol/L (ref 20–29)
Calcium: 11.1 mg/dL — ABNORMAL HIGH (ref 8.7–10.3)
Chloride: 101 mmol/L (ref 96–106)
Creatinine, Ser: 0.61 mg/dL (ref 0.57–1.00)
Globulin, Total: 2.4 g/dL (ref 1.5–4.5)
Glucose: 80 mg/dL (ref 70–99)
Potassium: 3.7 mmol/L (ref 3.5–5.2)
Sodium: 141 mmol/L (ref 134–144)
Total Protein: 6.6 g/dL (ref 6.0–8.5)
eGFR: 85 mL/min/1.73

## 2024-03-26 LAB — VITAMIN D 25 HYDROXY (VIT D DEFICIENCY, FRACTURES): Vit D, 25-Hydroxy: 37.8 ng/mL (ref 30.0–100.0)

## 2024-03-26 LAB — TSH: TSH: 2.53 u[IU]/mL (ref 0.450–4.500)

## 2024-03-26 NOTE — Assessment & Plan Note (Signed)
 Fall reduction, home safety and tylenol  as needed, intermittent use of meloxicam 

## 2024-03-26 NOTE — Assessment & Plan Note (Signed)
 Sleep hygiene reviewed and written information offered also. Prescription sent for  medication needed. Controlled with behavior modification, uses doxepin  at most twice per week

## 2024-03-26 NOTE — Assessment & Plan Note (Signed)
 Controlled, no change in medication DASH diet and commitment to daily physical activity for a minimum of 30 minutes discussed and encouraged, as a part of hypertension management. The importance of attaining a healthy weight is also discussed.     03/25/2024   10:37 AM 03/25/2024   10:10 AM 03/01/2024    3:30 PM 07/17/2023   11:00 AM 07/17/2023   10:58 AM 06/25/2023   11:35 AM 06/02/2023    3:29 PM  BP/Weight  Systolic BP 120 157 -- 134 154 117 138  Diastolic BP 70 80 -- 86 68 68 71  Wt. (Lbs)  153.04 170  158.08 163 167  BMI  26.27 kg/m2 29.18 kg/m2  28 kg/m2 28.87 kg/m2 29.58 kg/m2

## 2024-03-26 NOTE — Assessment & Plan Note (Signed)
 Hyperlipidemia:Low fat diet discussed and encouraged.   Lipid Panel  Lab Results  Component Value Date   CHOL 169 12/03/2022   HDL 62 12/03/2022   LDLCALC 93 12/03/2022   TRIG 72 12/03/2022   CHOLHDL 2.7 12/03/2022     Controlled, no change in medication Updated lab needed at/ before next visit.

## 2024-03-26 NOTE — Assessment & Plan Note (Signed)
 Updated lab needed at/ before next visit.

## 2024-11-09 ENCOUNTER — Ambulatory Visit: Payer: Self-pay | Admitting: Family Medicine

## 2025-03-03 ENCOUNTER — Ambulatory Visit: Payer: Self-pay
